# Patient Record
Sex: Male | Born: 1955 | Race: White | Hispanic: No | Marital: Married | Smoking: Former smoker
Health system: Southern US, Community
[De-identification: ages and names within clinical notes are randomized; demographics above are authoritative.]

## PROBLEM LIST (undated history)

## (undated) DIAGNOSIS — G4733 Obstructive sleep apnea (adult) (pediatric): Secondary | ICD-10-CM

## (undated) DIAGNOSIS — K635 Polyp of colon: Secondary | ICD-10-CM

## (undated) DIAGNOSIS — F419 Anxiety disorder, unspecified: Secondary | ICD-10-CM

## (undated) DIAGNOSIS — E559 Vitamin D deficiency, unspecified: Secondary | ICD-10-CM

## (undated) DIAGNOSIS — N189 Chronic kidney disease, unspecified: Secondary | ICD-10-CM

## (undated) DIAGNOSIS — F17200 Nicotine dependence, unspecified, uncomplicated: Secondary | ICD-10-CM

## (undated) DIAGNOSIS — Z9289 Personal history of other medical treatment: Secondary | ICD-10-CM

## (undated) DIAGNOSIS — E785 Hyperlipidemia, unspecified: Secondary | ICD-10-CM

## (undated) DIAGNOSIS — T7840XA Allergy, unspecified, initial encounter: Secondary | ICD-10-CM

## (undated) DIAGNOSIS — I10 Essential (primary) hypertension: Secondary | ICD-10-CM

## (undated) DIAGNOSIS — I219 Acute myocardial infarction, unspecified: Secondary | ICD-10-CM

## (undated) DIAGNOSIS — Z8619 Personal history of other infectious and parasitic diseases: Secondary | ICD-10-CM

## (undated) DIAGNOSIS — Z720 Tobacco use: Secondary | ICD-10-CM

## (undated) DIAGNOSIS — G4730 Sleep apnea, unspecified: Secondary | ICD-10-CM

## (undated) DIAGNOSIS — E669 Obesity, unspecified: Secondary | ICD-10-CM

## (undated) DIAGNOSIS — E7800 Pure hypercholesterolemia, unspecified: Secondary | ICD-10-CM

## (undated) DIAGNOSIS — I2510 Atherosclerotic heart disease of native coronary artery without angina pectoris: Secondary | ICD-10-CM

## (undated) DIAGNOSIS — I209 Angina pectoris, unspecified: Secondary | ICD-10-CM

## (undated) DIAGNOSIS — K219 Gastro-esophageal reflux disease without esophagitis: Secondary | ICD-10-CM

## (undated) HISTORY — DX: Polyp of colon: K63.5

## (undated) HISTORY — PX: CARDIAC CATHETERIZATION: SHX172

## (undated) HISTORY — PX: CORONARY ARTERY BYPASS GRAFT: SHX141

## (undated) HISTORY — PX: TONSILLECTOMY: SUR1361

## (undated) HISTORY — DX: Allergy, unspecified, initial encounter: T78.40XA

## (undated) HISTORY — DX: Vitamin D deficiency, unspecified: E55.9

## (undated) HISTORY — DX: Personal history of other infectious and parasitic diseases: Z86.19

## (undated) HISTORY — DX: Atherosclerotic heart disease of native coronary artery without angina pectoris: I25.10

## (undated) HISTORY — DX: Personal history of other medical treatment: Z92.89

---

## 1995-01-05 HISTORY — PX: BASAL CELL CARCINOMA EXCISION: SHX1214

## 2006-01-04 DIAGNOSIS — K635 Polyp of colon: Secondary | ICD-10-CM

## 2006-01-04 HISTORY — DX: Polyp of colon: K63.5

## 2006-10-07 ENCOUNTER — Ambulatory Visit: Payer: Self-pay | Admitting: Gastroenterology

## 2007-12-25 ENCOUNTER — Ambulatory Visit: Payer: Self-pay | Admitting: Urology

## 2008-01-09 ENCOUNTER — Ambulatory Visit: Payer: Self-pay | Admitting: Urology

## 2008-01-09 ENCOUNTER — Ambulatory Visit: Payer: Self-pay | Admitting: Internal Medicine

## 2008-01-11 ENCOUNTER — Ambulatory Visit: Payer: Self-pay | Admitting: Urology

## 2008-01-11 HISTORY — PX: LITHOTRIPSY: SUR834

## 2010-07-17 ENCOUNTER — Ambulatory Visit: Payer: Self-pay | Admitting: Otolaryngology

## 2011-01-11 ENCOUNTER — Ambulatory Visit: Payer: Self-pay | Admitting: Urology

## 2012-05-31 ENCOUNTER — Ambulatory Visit: Payer: Self-pay | Admitting: Family Medicine

## 2012-06-05 ENCOUNTER — Ambulatory Visit: Payer: Self-pay | Admitting: Cardiology

## 2012-06-05 ENCOUNTER — Inpatient Hospital Stay (HOSPITAL_COMMUNITY)
Admission: AD | Admit: 2012-06-05 | Discharge: 2012-06-10 | DRG: 236 | Disposition: A | Discharge status: Home / Self Care | Payer: Self-pay | Source: Other Acute Inpatient Hospital | Attending: Cardiothoracic Surgery | Admitting: Cardiothoracic Surgery

## 2012-06-05 ENCOUNTER — Inpatient Hospital Stay (HOSPITAL_COMMUNITY): Payer: Self-pay

## 2012-06-05 ENCOUNTER — Encounter (HOSPITAL_COMMUNITY)
Admission: AD | Disposition: A | Discharge status: Home / Self Care | Payer: Self-pay | Source: Other Acute Inpatient Hospital | Attending: Cardiothoracic Surgery

## 2012-06-05 ENCOUNTER — Encounter (HOSPITAL_COMMUNITY): Payer: Self-pay | Admitting: *Deleted

## 2012-06-05 DIAGNOSIS — D62 Acute posthemorrhagic anemia: Secondary | ICD-10-CM | POA: Diagnosis not present

## 2012-06-05 DIAGNOSIS — Z87891 Personal history of nicotine dependence: Secondary | ICD-10-CM | POA: Insufficient documentation

## 2012-06-05 DIAGNOSIS — I2510 Atherosclerotic heart disease of native coronary artery without angina pectoris: Secondary | ICD-10-CM

## 2012-06-05 DIAGNOSIS — E8779 Other fluid overload: Secondary | ICD-10-CM | POA: Diagnosis not present

## 2012-06-05 DIAGNOSIS — E669 Obesity, unspecified: Secondary | ICD-10-CM | POA: Diagnosis present

## 2012-06-05 DIAGNOSIS — E7800 Pure hypercholesterolemia, unspecified: Secondary | ICD-10-CM | POA: Diagnosis present

## 2012-06-05 DIAGNOSIS — Z720 Tobacco use: Secondary | ICD-10-CM

## 2012-06-05 DIAGNOSIS — Z6834 Body mass index (BMI) 34.0-34.9, adult: Secondary | ICD-10-CM

## 2012-06-05 DIAGNOSIS — Z7982 Long term (current) use of aspirin: Secondary | ICD-10-CM

## 2012-06-05 DIAGNOSIS — F17200 Nicotine dependence, unspecified, uncomplicated: Secondary | ICD-10-CM | POA: Diagnosis present

## 2012-06-05 DIAGNOSIS — Z8249 Family history of ischemic heart disease and other diseases of the circulatory system: Secondary | ICD-10-CM

## 2012-06-05 DIAGNOSIS — E785 Hyperlipidemia, unspecified: Secondary | ICD-10-CM | POA: Insufficient documentation

## 2012-06-05 DIAGNOSIS — Z01810 Encounter for preprocedural cardiovascular examination: Secondary | ICD-10-CM

## 2012-06-05 DIAGNOSIS — I252 Old myocardial infarction: Secondary | ICD-10-CM

## 2012-06-05 DIAGNOSIS — G4733 Obstructive sleep apnea (adult) (pediatric): Secondary | ICD-10-CM | POA: Diagnosis present

## 2012-06-05 DIAGNOSIS — Z79899 Other long term (current) drug therapy: Secondary | ICD-10-CM

## 2012-06-05 DIAGNOSIS — I10 Essential (primary) hypertension: Secondary | ICD-10-CM | POA: Diagnosis present

## 2012-06-05 DIAGNOSIS — K219 Gastro-esophageal reflux disease without esophagitis: Secondary | ICD-10-CM | POA: Diagnosis present

## 2012-06-05 HISTORY — DX: Hyperlipidemia, unspecified: E78.5

## 2012-06-05 HISTORY — DX: Essential (primary) hypertension: I10

## 2012-06-05 HISTORY — DX: Angina pectoris, unspecified: I20.9

## 2012-06-05 HISTORY — DX: Obstructive sleep apnea (adult) (pediatric): G47.33

## 2012-06-05 HISTORY — DX: Pure hypercholesterolemia, unspecified: E78.00

## 2012-06-05 HISTORY — DX: Sleep apnea, unspecified: G47.30

## 2012-06-05 HISTORY — DX: Gastro-esophageal reflux disease without esophagitis: K21.9

## 2012-06-05 HISTORY — DX: Obesity, unspecified: E66.9

## 2012-06-05 HISTORY — DX: Tobacco use: Z72.0

## 2012-06-05 HISTORY — DX: Acute myocardial infarction, unspecified: I21.9

## 2012-06-05 HISTORY — DX: Nicotine dependence, unspecified, uncomplicated: F17.200

## 2012-06-05 HISTORY — DX: Atherosclerotic heart disease of native coronary artery without angina pectoris: I25.10

## 2012-06-05 LAB — COMPREHENSIVE METABOLIC PANEL
ALT: 24 U/L (ref 0–53)
BUN: 16 mg/dL (ref 6–23)
CO2: 27 mEq/L (ref 19–32)
Calcium: 9.4 mg/dL (ref 8.4–10.5)
Creatinine, Ser: 0.92 mg/dL (ref 0.50–1.35)
GFR calc Af Amer: >90 mL/min (ref >90)
GFR calc non Af Amer: >90 mL/min (ref >90)
Glucose, Bld: 98 mg/dL (ref 70–99)

## 2012-06-05 LAB — POCT I-STAT 3, ART BLOOD GAS (G3+)
Bicarbonate: 26.2 mEq/L — ABNORMAL HIGH (ref 20.0–24.0)
O2 Saturation: 94 %
pCO2 arterial: 41.5 mmHg (ref 35.0–45.0)
pO2, Arterial: 71 mmHg — ABNORMAL LOW (ref 80.0–100.0)

## 2012-06-05 LAB — CBC
HCT: 50.3 % (ref 39.0–52.0)
Hemoglobin: 17.7 g/dL — ABNORMAL HIGH (ref 13.0–17.0)
MCH: 33.1 pg (ref 26.0–34.0)
MCHC: 35.2 g/dL (ref 30.0–36.0)
MCV: 94.2 fL (ref 78.0–100.0)
RBC: 5.34 MIL/uL (ref 4.22–5.81)

## 2012-06-05 LAB — PROTIME-INR
INR: 1.03 (ref 0.00–1.49)
Prothrombin Time: 13.4 seconds (ref 11.6–15.2)

## 2012-06-05 LAB — URINALYSIS, ROUTINE W REFLEX MICROSCOPIC
Glucose, UA: NEGATIVE mg/dL
Ketones, ur: 15 mg/dL — AB
Protein, ur: NEGATIVE mg/dL
Urobilinogen, UA: 1 mg/dL (ref 0.0–1.0)

## 2012-06-05 LAB — APTT: aPTT: 32 seconds (ref 24–37)

## 2012-06-05 LAB — URINE MICROSCOPIC-ADD ON

## 2012-06-05 LAB — TROPONIN I: Troponin I: <0.3 ng/mL (ref <0.30)

## 2012-06-05 SURGERY — CANCELLED PROCEDURE
Site: Chest

## 2012-06-05 MED ORDER — TEMAZEPAM 15 MG PO CAPS
15.0000 mg | ORAL_CAPSULE | Freq: Once | ORAL | Status: AC | PRN
  Administered 2012-06-05: 15 mg via ORAL
  Filled 2012-06-05: qty 1

## 2012-06-05 MED ORDER — INSULIN REGULAR HUMAN 100 UNIT/ML IJ SOLN
INTRAMUSCULAR | Status: AC
  Administered 2012-06-06: 1.1 [IU]/h via INTRAVENOUS
  Filled 2012-06-05: qty 1

## 2012-06-05 MED ORDER — DOPAMINE-DEXTROSE 3.2-5 MG/ML-% IV SOLN
2.0000 ug/kg/min | INTRAVENOUS | Status: AC
  Administered 2012-06-06: 3 ug/kg/min via INTRAVENOUS
  Filled 2012-06-05: qty 250

## 2012-06-05 MED ORDER — SODIUM CHLORIDE 0.9 % IV SOLN
INTRAVENOUS | Status: DC
  Filled 2012-06-05: qty 30

## 2012-06-05 MED ORDER — DEXTROSE 5 % IV SOLN
1.5000 g | INTRAVENOUS | Status: AC
  Administered 2012-06-06: 1.5 g via INTRAVENOUS
  Administered 2012-06-06: 750 g via INTRAVENOUS
  Filled 2012-06-05: qty 1.5

## 2012-06-05 MED ORDER — SODIUM CHLORIDE 0.9 % IV SOLN
INTRAVENOUS | Status: AC
  Administered 2012-06-06: 70 mL/h via INTRAVENOUS
  Filled 2012-06-05: qty 40

## 2012-06-05 MED ORDER — ONDANSETRON HCL 4 MG/2ML IJ SOLN: 4.0000 mg | Freq: Four times a day (QID) | INTRAMUSCULAR | Status: DC | PRN

## 2012-06-05 MED ORDER — PNEUMOCOCCAL VAC POLYVALENT 25 MCG/0.5ML IJ INJ: 0.5000 mL | INJECTION | INTRAMUSCULAR | Status: DC | PRN

## 2012-06-05 MED ORDER — ACETAMINOPHEN 325 MG PO TABS: 650.0000 mg | ORAL_TABLET | ORAL | Status: DC | PRN

## 2012-06-05 MED ORDER — CHLORHEXIDINE GLUCONATE 4 % EX LIQD
60.0000 mL | Freq: Once | CUTANEOUS | Status: AC
  Administered 2012-06-06: 4 via TOPICAL
  Filled 2012-06-05: qty 60

## 2012-06-05 MED ORDER — VANCOMYCIN HCL 10 G IV SOLR
1500.0000 mg | INTRAVENOUS | Status: AC
  Administered 2012-06-06: 1250 mg via INTRAVENOUS
  Filled 2012-06-05: qty 1500

## 2012-06-05 MED ORDER — EPINEPHRINE HCL 1 MG/ML IJ SOLN
0.5000 ug/min | INTRAVENOUS | Status: DC
  Filled 2012-06-05: qty 4

## 2012-06-05 MED ORDER — CHLORHEXIDINE GLUCONATE 4 % EX LIQD
60.0000 mL | Freq: Once | CUTANEOUS | Status: AC
  Administered 2012-06-05: 4 via TOPICAL
  Filled 2012-06-05: qty 60

## 2012-06-05 MED ORDER — ASPIRIN EC 81 MG PO TBEC
81.0000 mg | DELAYED_RELEASE_TABLET | Freq: Every day | ORAL | Status: DC
  Filled 2012-06-05: qty 1

## 2012-06-05 MED ORDER — NITROGLYCERIN IN D5W 200-5 MCG/ML-% IV SOLN
2.0000 ug/min | INTRAVENOUS | Status: AC
  Administered 2012-06-06: 3 ug/min via INTRAVENOUS
  Filled 2012-06-05: qty 250

## 2012-06-05 MED ORDER — DEXTROSE 5 % IV SOLN
750.0000 mg | INTRAVENOUS | Status: DC
  Filled 2012-06-05: qty 750

## 2012-06-05 MED ORDER — METOPROLOL TARTRATE 12.5 MG HALF TABLET
12.5000 mg | ORAL_TABLET | Freq: Once | ORAL | Status: AC
  Administered 2012-06-06: 12.5 mg via ORAL
  Filled 2012-06-05: qty 1

## 2012-06-05 MED ORDER — VANCOMYCIN HCL 1000 MG IV SOLR
INTRAVENOUS | Status: DC
  Filled 2012-06-05: qty 1000

## 2012-06-05 MED ORDER — POTASSIUM CHLORIDE 2 MEQ/ML IV SOLN
80.0000 meq | INTRAVENOUS | Status: DC
  Filled 2012-06-05: qty 40

## 2012-06-05 MED ORDER — PHENYLEPHRINE HCL 10 MG/ML IJ SOLN
30.0000 ug/min | INTRAVENOUS | Status: AC
  Administered 2012-06-06: 25 ug/min via INTRAVENOUS
  Filled 2012-06-05: qty 2

## 2012-06-05 MED ORDER — PLASMA-LYTE 148 IV SOLN
INTRAVENOUS | Status: AC
  Administered 2012-06-06: 08:00:00
  Filled 2012-06-05: qty 2.5

## 2012-06-05 MED ORDER — METOPROLOL TARTRATE 50 MG PO TABS
50.0000 mg | ORAL_TABLET | Freq: Two times a day (BID) | ORAL | Status: DC
  Administered 2012-06-05: 50 mg via ORAL
  Filled 2012-06-05 (×3): qty 1

## 2012-06-05 MED ORDER — HEPARIN (PORCINE) IN NACL 100-0.45 UNIT/ML-% IJ SOLN
1350.0000 [IU]/h | INTRAMUSCULAR | Status: DC
  Administered 2012-06-05: 1350 [IU]/h via INTRAVENOUS
  Filled 2012-06-05 (×2): qty 250

## 2012-06-05 MED ORDER — MAGNESIUM SULFATE 50 % IJ SOLN
40.0000 meq | INTRAMUSCULAR | Status: DC
  Filled 2012-06-05: qty 10

## 2012-06-05 MED ORDER — BISACODYL 5 MG PO TBEC: 5.0000 mg | DELAYED_RELEASE_TABLET | Freq: Once | ORAL | Status: DC

## 2012-06-05 MED ORDER — DEXMEDETOMIDINE HCL IN NACL 400 MCG/100ML IV SOLN
0.1000 ug/kg/h | INTRAVENOUS | Status: AC
  Administered 2012-06-06: 0.2 ug/kg/h via INTRAVENOUS
  Filled 2012-06-05: qty 100

## 2012-06-05 MED ORDER — NITROGLYCERIN 0.4 MG SL SUBL: 0.4000 mg | SUBLINGUAL_TABLET | SUBLINGUAL | Status: DC | PRN

## 2012-06-05 SURGICAL SUPPLY — 111 items
ADAPTER CARDIO PERF ANTE/RETRO (ADAPTER) IMPLANT
APPLIER CLIP 9.375 MED OPEN (MISCELLANEOUS)
APPLIER CLIP 9.375 SM OPEN (CLIP)
ATTRACTOMAT 16X20 MAGNETIC DRP (DRAPES) IMPLANT
BAG DECANTER FOR FLEXI CONT (MISCELLANEOUS) IMPLANT
BANDAGE ELASTIC 4 VELCRO ST LF (GAUZE/BANDAGES/DRESSINGS) IMPLANT
BANDAGE ELASTIC 6 VELCRO ST LF (GAUZE/BANDAGES/DRESSINGS) IMPLANT
BANDAGE GAUZE ELAST BULKY 4 IN (GAUZE/BANDAGES/DRESSINGS) IMPLANT
BASKET HEART (ORDER IN 25'S) (MISCELLANEOUS)
BASKET HEART (ORDER IN 25S) (MISCELLANEOUS) IMPLANT
BENZOIN TINCTURE PRP APPL 2/3 (GAUZE/BANDAGES/DRESSINGS) IMPLANT
BLADE STERNUM SYSTEM 6 (BLADE) IMPLANT
BLADE SURG ROTATE 9660 (MISCELLANEOUS) IMPLANT
CANISTER SUCTION 2500CC (MISCELLANEOUS) IMPLANT
CANNULA GUNDRY RCSP 15FR (MISCELLANEOUS) IMPLANT
CATH CPB KIT OWEN (MISCELLANEOUS) IMPLANT
CATH THORACIC 28FR (CATHETERS) IMPLANT
CATH THORACIC 28FR RT ANG (CATHETERS) IMPLANT
CATH THORACIC 36FR (CATHETERS) IMPLANT
CATH THORACIC 36FR RT ANG (CATHETERS) IMPLANT
CLIP APPLIE 9.375 MED OPEN (MISCELLANEOUS) IMPLANT
CLIP APPLIE 9.375 SM OPEN (CLIP) IMPLANT
CLIP FOGARTY SPRING 6M (CLIP) IMPLANT
CLIP TI MEDIUM 24 (CLIP) IMPLANT
CLIP TI WIDE RED SMALL 24 (CLIP) IMPLANT
CLOTH BEACON ORANGE TIMEOUT ST (SAFETY) IMPLANT
CONN Y 3/8X3/8X3/8  BEN (MISCELLANEOUS)
CONN Y 3/8X3/8X3/8 BEN (MISCELLANEOUS) IMPLANT
COVER SURGICAL LIGHT HANDLE (MISCELLANEOUS) IMPLANT
CRADLE DONUT ADULT HEAD (MISCELLANEOUS) IMPLANT
DRAIN CHANNEL 32F RND 10.7 FF (WOUND CARE) IMPLANT
DRAPE CARDIOVASCULAR INCISE (DRAPES)
DRAPE SLUSH/WARMER DISC (DRAPES) IMPLANT
DRAPE SRG 135X102X78XABS (DRAPES) IMPLANT
DRSG COVADERM 4X14 (GAUZE/BANDAGES/DRESSINGS) IMPLANT
ELECT REM PT RETURN 9FT ADLT (ELECTROSURGICAL)
ELECTRODE REM PT RTRN 9FT ADLT (ELECTROSURGICAL) IMPLANT
GLOVE BIO SURGEON STRL SZ 6 (GLOVE) IMPLANT
GLOVE BIO SURGEON STRL SZ 6.5 (GLOVE) IMPLANT
GLOVE BIO SURGEON STRL SZ7 (GLOVE) IMPLANT
GLOVE BIO SURGEON STRL SZ7.5 (GLOVE) IMPLANT
GLOVE BIOGEL PI IND STRL 6 (GLOVE) IMPLANT
GLOVE BIOGEL PI IND STRL 6.5 (GLOVE) IMPLANT
GLOVE BIOGEL PI IND STRL 7.0 (GLOVE) IMPLANT
GLOVE BIOGEL PI INDICATOR 6 (GLOVE)
GLOVE BIOGEL PI INDICATOR 6.5 (GLOVE)
GLOVE BIOGEL PI INDICATOR 7.0 (GLOVE)
GLOVE EUDERMIC 7 POWDERFREE (GLOVE) IMPLANT
GLOVE ORTHO TXT STRL SZ7.5 (GLOVE) IMPLANT
GOWN STRL NON-REIN LRG LVL3 (GOWN DISPOSABLE) IMPLANT
HEMOSTAT POWDER SURGIFOAM 1G (HEMOSTASIS) IMPLANT
INSERT FOGARTY 61MM (MISCELLANEOUS) IMPLANT
INSERT FOGARTY XLG (MISCELLANEOUS) IMPLANT
KIT BASIN OR (CUSTOM PROCEDURE TRAY) IMPLANT
KIT ROOM TURNOVER OR (KITS) IMPLANT
KIT SUCTION CATH 14FR (SUCTIONS) IMPLANT
KIT VASOVIEW W/TROCAR VH 2000 (KITS) IMPLANT
LEAD PACING MYOCARDI (MISCELLANEOUS) IMPLANT
MARKER GRAFT CORONARY BYPASS (MISCELLANEOUS) IMPLANT
NS IRRIG 1000ML POUR BTL (IV SOLUTION) IMPLANT
PACK OPEN HEART (CUSTOM PROCEDURE TRAY) IMPLANT
PAD ARMBOARD 7.5X6 YLW CONV (MISCELLANEOUS) IMPLANT
PAD ELECT DEFIB RADIOL ZOLL (MISCELLANEOUS) IMPLANT
PENCIL BUTTON HOLSTER BLD 10FT (ELECTRODE) IMPLANT
PUNCH AORTIC ROTATE 4.0MM (MISCELLANEOUS) IMPLANT
PUNCH AORTIC ROTATE 4.5MM 8IN (MISCELLANEOUS) IMPLANT
PUNCH AORTIC ROTATE 5MM 8IN (MISCELLANEOUS) IMPLANT
SOLUTION ANTI FOG 6CC (MISCELLANEOUS) IMPLANT
SPONGE GAUZE 4X4 12PLY (GAUZE/BANDAGES/DRESSINGS) IMPLANT
SPONGE LAP 18X18 X RAY DECT (DISPOSABLE) IMPLANT
SPONGE LAP 4X18 X RAY DECT (DISPOSABLE) IMPLANT
SUT BONE WAX W31G (SUTURE) IMPLANT
SUT ETHIBOND X763 2 0 SH 1 (SUTURE) IMPLANT
SUT MNCRL AB 3-0 PS2 18 (SUTURE) IMPLANT
SUT MNCRL AB 4-0 PS2 18 (SUTURE) IMPLANT
SUT PDS AB 1 CTX 36 (SUTURE) IMPLANT
SUT PROLENE 2 0 SH DA (SUTURE) IMPLANT
SUT PROLENE 3 0 SH DA (SUTURE) IMPLANT
SUT PROLENE 3 0 SH1 36 (SUTURE) IMPLANT
SUT PROLENE 4 0 RB 1 (SUTURE)
SUT PROLENE 4 0 SH DA (SUTURE) IMPLANT
SUT PROLENE 4-0 RB1 .5 CRCL 36 (SUTURE) IMPLANT
SUT PROLENE 5 0 C 1 36 (SUTURE) IMPLANT
SUT PROLENE 6 0 C 1 30 (SUTURE) IMPLANT
SUT PROLENE 7.0 RB 3 (SUTURE) IMPLANT
SUT PROLENE 8 0 BV175 6 (SUTURE) IMPLANT
SUT PROLENE BLUE 7 0 (SUTURE) IMPLANT
SUT PROLENE POLY MONO (SUTURE) IMPLANT
SUT SILK  1 MH (SUTURE)
SUT SILK 1 MH (SUTURE) IMPLANT
SUT STEEL 6MS V (SUTURE) IMPLANT
SUT STEEL STERNAL CCS#1 18IN (SUTURE) IMPLANT
SUT STEEL SZ 6 DBL 3X14 BALL (SUTURE) IMPLANT
SUT VIC AB 1 CTX 36 (SUTURE)
SUT VIC AB 1 CTX36XBRD ANBCTR (SUTURE) IMPLANT
SUT VIC AB 2-0 CT1 27 (SUTURE)
SUT VIC AB 2-0 CT1 TAPERPNT 27 (SUTURE) IMPLANT
SUT VIC AB 2-0 CTX 27 (SUTURE) IMPLANT
SUT VIC AB 3-0 SH 27 (SUTURE)
SUT VIC AB 3-0 SH 27X BRD (SUTURE) IMPLANT
SUT VIC AB 3-0 X1 27 (SUTURE) IMPLANT
SUT VICRYL 4-0 PS2 18IN ABS (SUTURE) IMPLANT
SUTURE E-PAK OPEN HEART (SUTURE) IMPLANT
SYSTEM SAHARA CHEST DRAIN ATS (WOUND CARE) IMPLANT
TOWEL OR 17X24 6PK STRL BLUE (TOWEL DISPOSABLE) IMPLANT
TOWEL OR 17X26 10 PK STRL BLUE (TOWEL DISPOSABLE) IMPLANT
TRAY FOLEY IC TEMP SENS 14FR (CATHETERS) IMPLANT
TUBE SUCT INTRACARD DLP 20F (MISCELLANEOUS) IMPLANT
TUBING INSUFFLATION 10FT LAP (TUBING) IMPLANT
UNDERPAD 30X30 INCONTINENT (UNDERPADS AND DIAPERS) IMPLANT
WATER STERILE IRR 1000ML POUR (IV SOLUTION) IMPLANT

## 2012-06-05 NOTE — Progress Notes (Signed)
ANTICOAGULATION CONSULT NOTE - Initial Consult  Pharmacy Consult for heparin Indication: chest pain/ACS  Allergies  Allergen Reactions  . Sulfa Antibiotics     "Makes him feel worse then better when taken"per wife    Patient Measurements: Height: 5\' 6"  (167.6 cm) Weight: 213 lb (96.616 kg) IBW/kg (Calculated) : 63.8 Heparin Dosing Weight:   Vital Signs: Temp: 98.4 F (36.9 C) (06/02 1641) Temp src: Oral (06/02 1641) BP: 150/90 mmHg (06/02 1700) Pulse Rate: 83 (06/02 1700)  Labs: No results found for this basename: HGB, HCT, PLT, APTT, LABPROT, INR, HEPARINUNFRC, CREATININE, CKTOTAL, CKMB, TROPONINI,  in the last 72 hours  CrCl is unknown because no creatinine reading has been taken.   Medical History: Past Medical History  Diagnosis Date  . Coronary artery disease   . Hypertension   . Sleep apnea   . Anginal pain   . Myocardial infarction   . GERD (gastroesophageal reflux disease)   . Smoker   . Hypercholesterolemia   . Left main coronary artery disease 06/05/2012  . Essential hypertension, benign 06/05/2012  . Tobacco abuse 06/05/2012  . Hyperlipidemia 06/05/2012  . Obesity (BMI 30-39.9) 06/05/2012  . Obstructive sleep apnea 06/05/2012    Medications:  Scheduled:  . aminocaproic acid (AMICAR) for OHS   Intravenous To OR  . [START ON 06/06/2012] aspirin EC  81 mg Oral Daily  . bisacodyl  5 mg Oral Once  . cefUROXime (ZINACEF)  IV  1.5 g Intravenous To OR  . cefUROXime (ZINACEF)  IV  750 mg Intravenous To OR  . chlorhexidine  60 mL Topical Once  . [START ON 06/06/2012] chlorhexidine  60 mL Topical Once  . dexmedetomidine  0.1-0.7 mcg/kg/hr Intravenous To OR  . DOPamine  2-20 mcg/kg/min Intravenous To OR  . epinephrine  0.5-20 mcg/min Intravenous To OR  . heparin-papaverine-plasmalyte irrigation   Irrigation To OR  . heparin 30,000 units/NS 1000 mL solution for CELLSAVER   Other To OR  . insulin (NOVOLIN-R) infusion   Intravenous To OR  . magnesium sulfate  40 mEq Other  To OR  . metoprolol tartrate  50 mg Oral BID  . [START ON 06/06/2012] metoprolol tartrate  12.5 mg Oral Once  . nitroGLYCERIN  2-200 mcg/min Intravenous To OR  . phenylephrine (NEO-SYNEPHRINE) Adult infusion  30-200 mcg/min Intravenous To OR  . potassium chloride  80 mEq Other To OR  . vancomycin 1000 mg in NS (1000 ml) irrigation for Dr. Cornelius Moras case   Irrigation To OR  . vancomycin  1,500 mg Intravenous To OR    Assessment: Pt had a cardiac cath today. He was found to have 99% stenosis of the left main coronary artery with 100% chronic occlusion of the right coronary artery. The patient was set up for an emergent CABG this afternoon but the pt had eaten lunch at 3:00PM so it was decided to postpone for tomorrow morning. Pt to be started on heparin. Goal of Therapy:  Heparin level 0.3-0.7 units/ml Monitor platelets by anticoagulation protocol: Yes   Plan:  No heparin bolus due to cardiac cath today.  Start heparin at 1350 units/hr.   Eugene Garnet 06/05/2012,6:53 PM

## 2012-06-05 NOTE — H&P (Addendum)
[address]       [name] [address]             [phone]      CARDIOTHORACIC SURGERY HISTORY AND PHYSICAL EXAM  PCP is Bosie Clos, MD Referring Provider is Harold Hedge, MD   Chief Complaint:  CHEST PAIN  HPI:  Patient is a 57 year old obese white male with no previous history of coronary artery disease but risk factors notable for history of hypertension, hypercholesterolemia, long-standing tobacco abuse, and a family history of coronary artery disease. The patient reportedly had a functional stress test performed in 2012 that was felt to be low risk for ischemia. The patient states that he was in his usual state of health until approximately 6 weeks ago when he first began to experience one of a total of 4 episodes of postprandial chest discomfort described as tightness radiating across his chest. He states that these episodes always occurred within 30 minutes of a meal in the evening and were associated with nausea and occasionally emesis as well as diaphoresis. The last and most recent episode of chest discomfort occurred 10 days ago and was more prolonged, lasting nearly 30 minutes before the symptoms resolved. The patient was evaluated in the office last Tuesday by Dr. Vonita Moss. Apparently a gallbladder ultrasound was performed and felt to be unremarkable but a EKG was performed demonstrating sinus rhythm with inferolateral ST segment depression. Serum troponin levels were elevated at 3.8. The patient was referred to Dr. Lady Gary who saw him in the office 3 days ago and scheduled him for elective cardiac catheterization which was performed this morning at [facility]. Cardiac catheterization is notable for the presence of 99% stenosis of the left main coronary artery with 100% chronic occlusion of the right coronary artery and left to right collaterals filling of the distal right corner circulation. Left ventricular function was preserved. The patient was  transferred directly to [facility]. Just prior to transport the patient was given lunch this afternoon at 3:00 PM.    Upon arrival at [facility] the patient notes that he feels well. He has not had any episodes of chest discomfort since a week ago Friday. He denies any other symptoms of chest discomfort either with activity or at rest other than the total for episodes which always occurred following meals. He otherwise denies any exertional shortness of breath, resting shortness of breath, PND, orthopnea, tachypalpitations, dizzy spells, or syncope.  Past Medical History  Diagnosis Date  . Coronary artery disease   . Hypertension   . Sleep apnea   . Anginal pain   . Myocardial infarction   . GERD (gastroesophageal reflux disease)   . Smoker   . Hypercholesterolemia   . Left main coronary artery disease 06/05/2012  . Essential hypertension, benign 06/05/2012  . Tobacco abuse 06/05/2012  . Hyperlipidemia 06/05/2012  . Obesity (BMI 30-39.9) 06/05/2012  . Obstructive sleep apnea 06/05/2012    Past Surgical History  Procedure Laterality Date  . Cardiac catheterization    . Tonsillectomy      Family History:  Notable for a strong presence of CAD  Social History History  Substance Use Topics  . Smoking status: Current Every Day Smoker -- 1.50 packs/day for 37 years    Types: Cigarettes  . Smokeless tobacco: Never Used  . Alcohol Use: No    Prior to Admission medications   Medication Sig Start Date End Date Taking? Authorizing Provider  aspirin EC  81 MG tablet Take 81 mg by mouth every evening.    Yes Historical Provider, MD  cetirizine (ZYRTEC) 10 MG tablet Take 10 mg by mouth every evening.   Yes Historical Provider, MD  cholecalciferol (VITAMIN D) 1000 UNITS tablet Take 2,000 Units by mouth every evening.   Yes Historical Provider, MD  losartan-hydrochlorothiazide (HYZAAR) 100-12.5 MG per tablet Take 1 tablet by mouth every evening.    Yes Historical Provider, MD  metoprolol  tartrate (LOPRESSOR) 25 MG tablet Take 50 mg by mouth every evening.    Yes Historical Provider, MD  rosuvastatin (CRESTOR) 20 MG tablet Take 20 mg by mouth every evening.    Yes Historical Provider, MD    Allergies  Allergen Reactions  . Sulfa Antibiotics     "Makes him feel worse then better when taken"per wife    Review of Systems:  General:  normal appetite, normal energy   Respiratory:  no cough, no wheezing, no hemoptysis, no pain with inspiration or cough, no shortness of breath   Cardiac:   no chest pain or tightness, no exertional SOB, no resting SOB, no PND, no orthopnea, no LE edema, no palpitations, no syncope  GI:   no difficulty swallowing, no hematochezia, no hematemesis, no melena, no constipation, no diarrhea   GU:   no dysuria, no urgency, no frequency   Musculoskeletal: no arthritis, no arthralgia   Vascular:  no pain suggestive of claudication   Neuro:   no symptoms suggestive of TIA's, no seizures, no headaches, no peripheral neuropathy   Endocrine:  Negative   HEENT:  no loose teeth or painful teeth,  no recent vision changes  Psych:   no anxiety, no depression    Physical Exam:   BP 150/90  Pulse 83  Temp(Src) 98.4 F (36.9 C) (Oral)  Resp 14  Ht 5\' 6"  (1.676 m)  Wt 96.616 kg (213 lb)  BMI 34.4 kg/m2  SpO2 98%  General:  Mildly obese but  well-appearing  HEENT:  Unremarkable   Neck:   no JVD, no bruits, no adenopathy   Chest:   clear to auscultation, symmetrical breath sounds, no wheezes, no rhonchi   CV:   RRR, no  murmur   Abdomen:  soft, non-tender, no masses   Extremities:  warm, well-perfused, pulses diminished both lower legs  Rectal/GU  Deferred  Neuro:   Grossly non-focal and symmetrical throughout  Skin:   Clean and dry, no rashes, no breakdown  Diagnostic Tests:  Cardiac catheterization performed today at [facility] is reviewed. There is critical 99% stenosis of the distal left main coronary artery with ulcerated  plaque extending into the bifurcation. There is subtotal occlusion of the terminal branches of the left circumflex coronary artery. There is a large intermediate branch vessel without significant disease. Left anterior descending coronary artery is free of significant disease. There is 100% chronic occlusion of the right coronary artery with left to right collateral filling of the terminal branches including the posterior descending coronary artery. Left ventricular function appears preserved with no significant wall motion abnormalities.   Impression:  Critical left main disease with three-vessel coronary artery disease and preserved left ventricular function. The patient has had a total of 4 episodes of postprandial chest pain consistent with angina pectoris, the most recent of which occurred 10 days ago and was associated with elevated troponin levels obtained 6 days ago at Dr. Christell Faith office.  Given the patient's cardiac cath findings I feel there is no  question that the patient needs to proceed with urgent coronary artery bypass grafting. Unfortunately, he was given a meal at 3:00 this afternoon just prior to transport. Options include proceeding with surgery this evening with perhaps slightly increased risk of aspiration during induction of anesthesia versus waiting until first thing tomorrow morning.    Plan:  I've discussed the indications, risks, and potential benefits of coronary artery bypass grafting at length with the patient and his wife this afternoon. We plan to hold off until tomorrow morning given the fact that he just had a small meal and that he remains very stable without any symptoms of chest pain for the past 10 days.  The patient and his family understand and accept all potential associated risks of surgery including but not limited to risk of death, stroke, myocardial infarction, congestive heart failure, respiratory failure, renal failure, bleeding requiring blood transfusion,  arrhythmia, infection, and late recurrence of coronary artery disease. The patient has been counseled regarding his need to quit smoking permanently.  They understand the very small risk that he might develop acute coronary ischemia in the between now and the time of surgery which could potentially result in heart attack or need to proceed to the operating room emergently tonight. All the questions been addressed.    Salvatore Decent. Cornelius Moras, MD

## 2012-06-05 NOTE — Progress Notes (Signed)
VASCULAR LAB PRELIMINARY  PRELIMINARY  PRELIMINARY  PRELIMINARY  Pre-op Cardiac Surgery  Carotid Findings: Bilateral:  No evidence of hemodynamically significant internal carotid artery stenosis.   Vertebral artery flow is antegrade.     Upper Extremity Right Left  Brachial Pressures 162 Triphasic 164 Triphasic  Radial Waveforms Triphasic Triphasic  Ulnar Waveforms Triphasic Triphasic  Palmar Arch (Allen's Test) Normal Normal    Findings:   Doppler waveforms remained normal bilaterally with both radial and ulnar compressions   Lower  Extremity Right Left  Dorsalis Pedis    Anterior Tibial    Posterior Tibial    Ankle/Brachial Indices      Findings:  Pedal pulses were palpable bilaterally   Zoi Devine, RVS 06/05/2012, 8:44 PM

## 2012-06-05 NOTE — OR Nursing (Signed)
Supplies opened for CABG and case cancelled per MD.

## 2012-06-06 ENCOUNTER — Encounter (HOSPITAL_COMMUNITY): Payer: Self-pay | Admitting: Anesthesiology

## 2012-06-06 ENCOUNTER — Inpatient Hospital Stay (HOSPITAL_COMMUNITY): Payer: Self-pay | Admitting: Anesthesiology

## 2012-06-06 ENCOUNTER — Inpatient Hospital Stay (HOSPITAL_COMMUNITY): Payer: Self-pay

## 2012-06-06 ENCOUNTER — Encounter (HOSPITAL_COMMUNITY)
Admission: AD | Disposition: A | Discharge status: Home / Self Care | Payer: Self-pay | Source: Other Acute Inpatient Hospital | Attending: Cardiothoracic Surgery

## 2012-06-06 DIAGNOSIS — I2510 Atherosclerotic heart disease of native coronary artery without angina pectoris: Secondary | ICD-10-CM

## 2012-06-06 HISTORY — PX: CORONARY ARTERY BYPASS GRAFT: SHX141

## 2012-06-06 HISTORY — PX: INTRAOPERATIVE TRANSESOPHAGEAL ECHOCARDIOGRAM: SHX5062

## 2012-06-06 HISTORY — PX: ENDOVEIN HARVEST OF GREATER SAPHENOUS VEIN: SHX5059

## 2012-06-06 LAB — CBC
HCT: 51.2 % (ref 39.0–52.0)
HCT: 40.5 % (ref 39.0–52.0)
HCT: 41 % (ref 39.0–52.0)
Hemoglobin: 14.7 g/dL (ref 13.0–17.0)
Hemoglobin: 14.7 g/dL (ref 13.0–17.0)
MCH: 33.3 pg (ref 26.0–34.0)
MCHC: 36.3 g/dL — ABNORMAL HIGH (ref 30.0–36.0)
MCHC: 35.9 g/dL (ref 30.0–36.0)
MCV: 93.4 fL (ref 78.0–100.0)
MCV: 93 fL (ref 78.0–100.0)
Platelets: 158 10*3/uL (ref 150–400)
RBC: 5.48 MIL/uL (ref 4.22–5.81)
RBC: 4.35 MIL/uL (ref 4.22–5.81)
RBC: 4.41 MIL/uL (ref 4.22–5.81)
RDW: 12.2 % (ref 11.5–15.5)
RDW: 12.1 % (ref 11.5–15.5)
WBC: 19 10*3/uL — ABNORMAL HIGH (ref 4.0–10.5)

## 2012-06-06 LAB — GLUCOSE, CAPILLARY
Glucose-Capillary: 130 mg/dL — ABNORMAL HIGH (ref 70–99)
Glucose-Capillary: 139 mg/dL — ABNORMAL HIGH (ref 70–99)
Glucose-Capillary: 141 mg/dL — ABNORMAL HIGH (ref 70–99)
Glucose-Capillary: 148 mg/dL — ABNORMAL HIGH (ref 70–99)
Glucose-Capillary: 216 mg/dL — ABNORMAL HIGH (ref 70–99)
Glucose-Capillary: 144 mg/dL — ABNORMAL HIGH (ref 70–99)

## 2012-06-06 LAB — BASIC METABOLIC PANEL
BUN: 15 mg/dL (ref 6–23)
CO2: 26 mEq/L (ref 19–32)
Chloride: 101 mEq/L (ref 96–112)
GFR calc Af Amer: >90 mL/min (ref >90)
Potassium: 3.6 mEq/L (ref 3.5–5.1)

## 2012-06-06 LAB — LIPID PANEL
HDL: 38 mg/dL — ABNORMAL LOW (ref >39)
LDL Cholesterol: 68 mg/dL (ref 0–99)
Total CHOL/HDL Ratio: 3.4 RATIO
Triglycerides: 111 mg/dL (ref <150)

## 2012-06-06 LAB — POCT I-STAT 4, (NA,K, GLUC, HGB,HCT)
Glucose, Bld: 97 mg/dL (ref 70–99)
Glucose, Bld: 87 mg/dL (ref 70–99)
Glucose, Bld: 138 mg/dL — ABNORMAL HIGH (ref 70–99)
HCT: 51 % (ref 39.0–52.0)
HCT: 34 % — ABNORMAL LOW (ref 39.0–52.0)
HCT: 42 % (ref 39.0–52.0)
Hemoglobin: 17.3 g/dL — ABNORMAL HIGH (ref 13.0–17.0)
Hemoglobin: 15.6 g/dL (ref 13.0–17.0)
Hemoglobin: 11.6 g/dL — ABNORMAL LOW (ref 13.0–17.0)
Hemoglobin: 12.9 g/dL — ABNORMAL LOW (ref 13.0–17.0)
Potassium: 3.6 mEq/L (ref 3.5–5.1)
Potassium: 4.3 mEq/L (ref 3.5–5.1)
Potassium: 3.6 mEq/L (ref 3.5–5.1)
Sodium: 140 mEq/L (ref 135–145)
Sodium: 141 mEq/L (ref 135–145)

## 2012-06-06 LAB — CREATININE, SERUM
Creatinine, Ser: 0.77 mg/dL (ref 0.50–1.35)
GFR calc Af Amer: >90 mL/min (ref >90)
GFR calc non Af Amer: >90 mL/min (ref >90)

## 2012-06-06 LAB — TROPONIN I: Troponin I: <0.3 ng/mL (ref <0.30)

## 2012-06-06 LAB — HEMOGLOBIN AND HEMATOCRIT, BLOOD
HCT: 35.8 % — ABNORMAL LOW (ref 39.0–52.0)
Hemoglobin: 12.9 g/dL — ABNORMAL LOW (ref 13.0–17.0)

## 2012-06-06 LAB — MAGNESIUM: Magnesium: 2.4 mg/dL (ref 1.5–2.5)

## 2012-06-06 LAB — POCT I-STAT 3, ART BLOOD GAS (G3+)
Acid-base deficit: 3 mmol/L — ABNORMAL HIGH (ref 0.0–2.0)
Acid-base deficit: 5 mmol/L — ABNORMAL HIGH (ref 0.0–2.0)
Bicarbonate: 21 mEq/L (ref 20.0–24.0)
O2 Saturation: 100 %
O2 Saturation: 95 %
Patient temperature: 36.8
Patient temperature: 36.7
TCO2: 23 mmol/L (ref 0–100)
pCO2 arterial: 38.1 mmHg (ref 35.0–45.0)
pH, Arterial: 7.415 (ref 7.350–7.450)
pH, Arterial: 7.302 — ABNORMAL LOW (ref 7.350–7.450)
pO2, Arterial: 324 mmHg — ABNORMAL HIGH (ref 80.0–100.0)
pO2, Arterial: 84 mmHg (ref 80.0–100.0)

## 2012-06-06 LAB — PLATELET COUNT: Platelets: 165 10*3/uL (ref 150–400)

## 2012-06-06 LAB — PROTIME-INR: INR: 1.48 (ref 0.00–1.49)

## 2012-06-06 LAB — HEMOGLOBIN A1C: Hgb A1c MFr Bld: 5.5 % (ref <5.7)

## 2012-06-06 LAB — APTT: aPTT: 30 seconds (ref 24–37)

## 2012-06-06 SURGERY — CORONARY ARTERY BYPASS GRAFTING (CABG)
Anesthesia: General | Site: Leg Upper | Laterality: Right | Wound class: Clean

## 2012-06-06 MED ORDER — HEPARIN SODIUM (PORCINE) 1000 UNIT/ML IJ SOLN
INTRAMUSCULAR | Status: DC | PRN
  Administered 2012-06-06: 25000 [IU] via INTRAVENOUS

## 2012-06-06 MED ORDER — ASPIRIN 81 MG PO CHEW: 324.0000 mg | CHEWABLE_TABLET | Freq: Every day | ORAL | Status: DC

## 2012-06-06 MED ORDER — VITAMIN D3 25 MCG (1000 UNIT) PO TABS
2000.0000 [IU] | ORAL_TABLET | Freq: Every evening | ORAL | Status: DC
  Administered 2012-06-07: 2000 [IU] via ORAL
  Filled 2012-06-06 (×3): qty 2

## 2012-06-06 MED ORDER — ACETAMINOPHEN 10 MG/ML IV SOLN
1000.0000 mg | Freq: Once | INTRAVENOUS | Status: AC
  Administered 2012-06-06: 1000 mg via INTRAVENOUS
  Filled 2012-06-06: qty 100

## 2012-06-06 MED ORDER — SODIUM CHLORIDE 0.9 % IJ SOLN
OROMUCOSAL | Status: DC | PRN
  Administered 2012-06-06: 08:00:00 via TOPICAL

## 2012-06-06 MED ORDER — MIDAZOLAM HCL 5 MG/5ML IJ SOLN
INTRAMUSCULAR | Status: DC | PRN
  Administered 2012-06-06 (×2): 2 mg via INTRAVENOUS
  Administered 2012-06-06: 4 mg via INTRAVENOUS
  Administered 2012-06-06: 2 mg via INTRAVENOUS
  Administered 2012-06-06: 5 mg via INTRAVENOUS
  Administered 2012-06-06: 4 mg via INTRAVENOUS
  Administered 2012-06-06: 1 mg via INTRAVENOUS

## 2012-06-06 MED ORDER — MORPHINE SULFATE 2 MG/ML IJ SOLN
2.0000 mg | INTRAMUSCULAR | Status: DC | PRN
  Administered 2012-06-06 (×2): 4 mg via INTRAVENOUS
  Administered 2012-06-07: 2 mg via INTRAVENOUS
  Administered 2012-06-07: 4 mg via INTRAVENOUS
  Administered 2012-06-07: 2 mg via INTRAVENOUS
  Administered 2012-06-07 (×2): 4 mg via INTRAVENOUS
  Administered 2012-06-07: 2 mg via INTRAVENOUS
  Administered 2012-06-07: 4 mg via INTRAVENOUS
  Filled 2012-06-06 (×2): qty 1
  Filled 2012-06-06 (×4): qty 2
  Filled 2012-06-06: qty 1

## 2012-06-06 MED ORDER — ALBUMIN HUMAN 5 % IV SOLN
INTRAVENOUS | Status: DC | PRN
  Administered 2012-06-06 (×2): via INTRAVENOUS

## 2012-06-06 MED ORDER — LACTATED RINGERS IV SOLN
INTRAVENOUS | Status: DC | PRN
  Administered 2012-06-06 (×2): via INTRAVENOUS

## 2012-06-06 MED ORDER — ACETAMINOPHEN 160 MG/5ML PO SOLN
975.0000 mg | Freq: Four times a day (QID) | ORAL | Status: DC
  Filled 2012-06-06: qty 40.6

## 2012-06-06 MED ORDER — METOPROLOL TARTRATE 1 MG/ML IV SOLN: 2.5000 mg | INTRAVENOUS | Status: DC | PRN

## 2012-06-06 MED ORDER — LACTATED RINGERS IV SOLN
INTRAVENOUS | Status: DC | PRN
  Administered 2012-06-06: 07:00:00 via INTRAVENOUS

## 2012-06-06 MED ORDER — 0.9 % SODIUM CHLORIDE (POUR BTL) OPTIME
TOPICAL | Status: DC | PRN
  Administered 2012-06-06: 1000 mL

## 2012-06-06 MED ORDER — PROPOFOL 10 MG/ML IV BOLUS
INTRAVENOUS | Status: DC | PRN
  Administered 2012-06-06: 150 mg via INTRAVENOUS

## 2012-06-06 MED ORDER — ASPIRIN EC 325 MG PO TBEC
325.0000 mg | DELAYED_RELEASE_TABLET | Freq: Every day | ORAL | Status: DC
  Filled 2012-06-06: qty 1

## 2012-06-06 MED ORDER — ATORVASTATIN CALCIUM 40 MG PO TABS
40.0000 mg | ORAL_TABLET | Freq: Every day | ORAL | Status: DC
  Filled 2012-06-06 (×2): qty 1

## 2012-06-06 MED ORDER — NITROGLYCERIN IN D5W 200-5 MCG/ML-% IV SOLN: 0.0000 ug/min | INTRAVENOUS | Status: DC

## 2012-06-06 MED ORDER — VECURONIUM BROMIDE 10 MG IV SOLR
INTRAVENOUS | Status: DC | PRN
  Administered 2012-06-06 (×2): 5 mg via INTRAVENOUS

## 2012-06-06 MED ORDER — SODIUM BICARBONATE 8.4 % IV SOLN
50.0000 meq | Freq: Once | INTRAVENOUS | Status: AC
  Administered 2012-06-06: 50 meq via INTRAVENOUS

## 2012-06-06 MED ORDER — FENTANYL CITRATE 0.05 MG/ML IJ SOLN
INTRAMUSCULAR | Status: DC | PRN
  Administered 2012-06-06 (×2): 250 ug via INTRAVENOUS
  Administered 2012-06-06: 50 ug via INTRAVENOUS
  Administered 2012-06-06: 250 ug via INTRAVENOUS
  Administered 2012-06-06: 150 ug via INTRAVENOUS
  Administered 2012-06-06: 200 ug via INTRAVENOUS
  Administered 2012-06-06: 150 ug via INTRAVENOUS
  Administered 2012-06-06: 200 ug via INTRAVENOUS

## 2012-06-06 MED ORDER — SODIUM CHLORIDE 0.9 % IJ SOLN: 3.0000 mL | INTRAMUSCULAR | Status: DC | PRN

## 2012-06-06 MED ORDER — SODIUM CHLORIDE 0.9 % IV SOLN: INTRAVENOUS | Status: DC

## 2012-06-06 MED ORDER — SODIUM CHLORIDE 0.9 % IJ SOLN: 3.0000 mL | Freq: Two times a day (BID) | INTRAMUSCULAR | Status: DC

## 2012-06-06 MED ORDER — ONDANSETRON HCL 4 MG/2ML IJ SOLN: 4.0000 mg | Freq: Four times a day (QID) | INTRAMUSCULAR | Status: DC | PRN

## 2012-06-06 MED ORDER — ROCURONIUM BROMIDE 100 MG/10ML IV SOLN
INTRAVENOUS | Status: DC | PRN
  Administered 2012-06-06 (×3): 50 mg via INTRAVENOUS

## 2012-06-06 MED ORDER — INSULIN REGULAR BOLUS VIA INFUSION
0.0000 [IU] | Freq: Three times a day (TID) | INTRAVENOUS | Status: DC
  Filled 2012-06-06: qty 10

## 2012-06-06 MED ORDER — SODIUM CHLORIDE 0.9 % IV SOLN
INTRAVENOUS | Status: DC
  Filled 2012-06-06: qty 1

## 2012-06-06 MED ORDER — DEXMEDETOMIDINE HCL IN NACL 200 MCG/50ML IV SOLN
0.1000 ug/kg/h | INTRAVENOUS | Status: DC
  Administered 2012-06-06: 0.5 ug/kg/h via INTRAVENOUS
  Filled 2012-06-06: qty 50

## 2012-06-06 MED ORDER — METOCLOPRAMIDE HCL 5 MG/ML IJ SOLN
10.0000 mg | Freq: Four times a day (QID) | INTRAMUSCULAR | Status: AC
  Administered 2012-06-06 – 2012-06-07 (×3): 10 mg via INTRAVENOUS
  Filled 2012-06-06 (×4): qty 2

## 2012-06-06 MED ORDER — DOCUSATE SODIUM 100 MG PO CAPS: 200.0000 mg | ORAL_CAPSULE | Freq: Every day | ORAL | Status: DC

## 2012-06-06 MED ORDER — MORPHINE SULFATE 2 MG/ML IJ SOLN
1.0000 mg | INTRAMUSCULAR | Status: AC | PRN
  Administered 2012-06-06: 2 mg via INTRAVENOUS
  Administered 2012-06-06: 4 mg via INTRAVENOUS
  Administered 2012-06-06: 2 mg via INTRAVENOUS
  Administered 2012-06-06 – 2012-06-07 (×2): 4 mg via INTRAVENOUS
  Filled 2012-06-06: qty 2

## 2012-06-06 MED ORDER — SODIUM CHLORIDE 0.45 % IV SOLN
INTRAVENOUS | Status: DC
  Administered 2012-06-06: 14:00:00 via INTRAVENOUS

## 2012-06-06 MED ORDER — VANCOMYCIN HCL IN DEXTROSE 1-5 GM/200ML-% IV SOLN
1000.0000 mg | Freq: Once | INTRAVENOUS | Status: AC
  Administered 2012-06-06: 1000 mg via INTRAVENOUS
  Filled 2012-06-06: qty 200

## 2012-06-06 MED ORDER — PROTAMINE SULFATE 10 MG/ML IV SOLN
INTRAVENOUS | Status: DC | PRN
  Administered 2012-06-06: 170 mg via INTRAVENOUS
  Administered 2012-06-06 (×2): 50 mg via INTRAVENOUS

## 2012-06-06 MED ORDER — METOPROLOL TARTRATE 25 MG/10 ML ORAL SUSPENSION
12.5000 mg | Freq: Two times a day (BID) | ORAL | Status: DC
  Filled 2012-06-06 (×3): qty 5

## 2012-06-06 MED ORDER — ARTIFICIAL TEARS OP OINT
TOPICAL_OINTMENT | OPHTHALMIC | Status: DC | PRN
  Administered 2012-06-06: 1 via OPHTHALMIC

## 2012-06-06 MED ORDER — FAMOTIDINE IN NACL 20-0.9 MG/50ML-% IV SOLN
20.0000 mg | Freq: Two times a day (BID) | INTRAVENOUS | Status: DC
  Administered 2012-06-06: 20 mg via INTRAVENOUS

## 2012-06-06 MED ORDER — OXYCODONE HCL 5 MG PO TABS
5.0000 mg | ORAL_TABLET | ORAL | Status: DC | PRN
  Filled 2012-06-06: qty 2

## 2012-06-06 MED ORDER — LACTATED RINGERS IV SOLN: 500.0000 mL | Freq: Once | INTRAVENOUS | Status: AC | PRN

## 2012-06-06 MED ORDER — HEMOSTATIC AGENTS (NO CHARGE) OPTIME
TOPICAL | Status: DC | PRN
  Administered 2012-06-06: 1 via TOPICAL

## 2012-06-06 MED ORDER — BISACODYL 10 MG RE SUPP: 10.0000 mg | Freq: Every day | RECTAL | Status: DC

## 2012-06-06 MED ORDER — MIDAZOLAM HCL 2 MG/2ML IJ SOLN: 2.0000 mg | INTRAMUSCULAR | Status: DC | PRN

## 2012-06-06 MED ORDER — DEXTROSE 5 % IV SOLN
1.5000 g | Freq: Two times a day (BID) | INTRAVENOUS | Status: DC
  Administered 2012-06-06 – 2012-06-08 (×3): 1.5 g via INTRAVENOUS
  Filled 2012-06-06 (×4): qty 1.5

## 2012-06-06 MED ORDER — SUCCINYLCHOLINE CHLORIDE 20 MG/ML IJ SOLN
INTRAMUSCULAR | Status: DC | PRN
  Administered 2012-06-06: 120 mg via INTRAVENOUS

## 2012-06-06 MED ORDER — SODIUM CHLORIDE 0.9 % IV SOLN: 250.0000 mL | INTRAVENOUS | Status: DC

## 2012-06-06 MED ORDER — PHENYLEPHRINE HCL 10 MG/ML IJ SOLN
0.0000 ug/min | INTRAVENOUS | Status: DC
  Filled 2012-06-06: qty 2

## 2012-06-06 MED ORDER — DOPAMINE-DEXTROSE 3.2-5 MG/ML-% IV SOLN
3.0000 ug/kg/min | INTRAVENOUS | Status: DC
  Filled 2012-06-06: qty 250

## 2012-06-06 MED ORDER — METOPROLOL TARTRATE 12.5 MG HALF TABLET
12.5000 mg | ORAL_TABLET | Freq: Two times a day (BID) | ORAL | Status: DC
Start: 2012-06-06 — End: 2012-06-07
  Filled 2012-06-06 (×3): qty 1

## 2012-06-06 MED ORDER — LACTATED RINGERS IV SOLN: INTRAVENOUS | Status: DC

## 2012-06-06 MED ORDER — BISACODYL 5 MG PO TBEC
10.0000 mg | DELAYED_RELEASE_TABLET | Freq: Every day | ORAL | Status: DC
  Administered 2012-06-07: 10 mg via ORAL
  Filled 2012-06-06: qty 2

## 2012-06-06 MED ORDER — MAGNESIUM SULFATE 40 MG/ML IJ SOLN
4.0000 g | Freq: Once | INTRAMUSCULAR | Status: AC
  Administered 2012-06-06: 4 g via INTRAVENOUS
  Filled 2012-06-06: qty 100

## 2012-06-06 MED ORDER — ACETAMINOPHEN 500 MG PO TABS
1000.0000 mg | ORAL_TABLET | Freq: Four times a day (QID) | ORAL | Status: DC
  Administered 2012-06-07 – 2012-06-10 (×12): 1000 mg via ORAL
  Filled 2012-06-06 (×17): qty 2

## 2012-06-06 MED ORDER — ALBUMIN HUMAN 5 % IV SOLN
250.0000 mL | INTRAVENOUS | Status: AC | PRN
  Administered 2012-06-06: 250 mL via INTRAVENOUS

## 2012-06-06 MED ORDER — POTASSIUM CHLORIDE 10 MEQ/50ML IV SOLN
10.0000 meq | INTRAVENOUS | Status: AC
  Administered 2012-06-06 (×3): 10 meq via INTRAVENOUS

## 2012-06-06 MED ORDER — PANTOPRAZOLE SODIUM 40 MG PO TBEC: 40.0000 mg | DELAYED_RELEASE_TABLET | Freq: Every day | ORAL | Status: DC

## 2012-06-06 SURGICAL SUPPLY — 116 items
ATTRACTOMAT 16X20 MAGNETIC DRP (DRAPES) ×4 IMPLANT
BAG DECANTER FOR FLEXI CONT (MISCELLANEOUS) ×4 IMPLANT
BANDAGE ELASTIC 4 VELCRO ST LF (GAUZE/BANDAGES/DRESSINGS) ×8 IMPLANT
BANDAGE ELASTIC 6 VELCRO ST LF (GAUZE/BANDAGES/DRESSINGS) ×8 IMPLANT
BANDAGE GAUZE ELAST BULKY 4 IN (GAUZE/BANDAGES/DRESSINGS) ×8 IMPLANT
BENZOIN TINCTURE PRP APPL 2/3 (GAUZE/BANDAGES/DRESSINGS) ×4 IMPLANT
BLADE STERNUM SYSTEM 6 (BLADE) ×4 IMPLANT
BLADE SURG 11 STRL SS (BLADE) ×4 IMPLANT
BLADE SURG ROTATE 9660 (MISCELLANEOUS) ×4 IMPLANT
CANISTER SUCTION 2500CC (MISCELLANEOUS) ×4 IMPLANT
CANN PRFSN .5XCNCT 15X34-48 (MISCELLANEOUS) ×3
CANNULA AORTIC HI-FLOW 6.5M20F (CANNULA) ×4 IMPLANT
CANNULA PRFSN .5XCNCT 15X34-48 (MISCELLANEOUS) ×3 IMPLANT
CANNULA VEN 2 STAGE (MISCELLANEOUS) ×1
CATH CPB KIT GERHARDT (MISCELLANEOUS) ×4 IMPLANT
CATH THORACIC 28FR (CATHETERS) ×4 IMPLANT
CATH THORACIC 36FR (CATHETERS) IMPLANT
CATH THORACIC 36FR RT ANG (CATHETERS) IMPLANT
CLIP RETRACTION 3.0MM CORONARY (MISCELLANEOUS) ×4 IMPLANT
CLIP TI MEDIUM 24 (CLIP) IMPLANT
CLIP TI WIDE RED SMALL 24 (CLIP) IMPLANT
CLOTH BEACON ORANGE TIMEOUT ST (SAFETY) ×4 IMPLANT
CLSR STERI-STRIP ANTIMIC 1/2X4 (GAUZE/BANDAGES/DRESSINGS) ×4 IMPLANT
COVER SURGICAL LIGHT HANDLE (MISCELLANEOUS) ×4 IMPLANT
CRADLE DONUT ADULT HEAD (MISCELLANEOUS) ×4 IMPLANT
DRAIN CHANNEL 28F RND 3/8 FF (WOUND CARE) ×4 IMPLANT
DRAPE CARDIOVASCULAR INCISE (DRAPES) ×1
DRAPE SLUSH/WARMER DISC (DRAPES) ×4 IMPLANT
DRAPE SRG 135X102X78XABS (DRAPES) ×3 IMPLANT
DRSG COVADERM 4X14 (GAUZE/BANDAGES/DRESSINGS) ×4 IMPLANT
ELECT BLADE 4.0 EZ CLEAN MEGAD (MISCELLANEOUS) ×4
ELECT REM PT RETURN 9FT ADLT (ELECTROSURGICAL) ×8
ELECTRODE BLDE 4.0 EZ CLN MEGD (MISCELLANEOUS) ×3 IMPLANT
ELECTRODE REM PT RTRN 9FT ADLT (ELECTROSURGICAL) ×6 IMPLANT
GLOVE BIO SURGEON STRL SZ 6 (GLOVE) ×4 IMPLANT
GLOVE BIO SURGEON STRL SZ 6.5 (GLOVE) ×28 IMPLANT
GLOVE BIO SURGEON STRL SZ7 (GLOVE) IMPLANT
GLOVE BIO SURGEON STRL SZ7.5 (GLOVE) ×12 IMPLANT
GLOVE BIOGEL PI IND STRL 6 (GLOVE) ×12 IMPLANT
GLOVE BIOGEL PI IND STRL 6.5 (GLOVE) ×15 IMPLANT
GLOVE BIOGEL PI IND STRL 7.0 (GLOVE) ×3 IMPLANT
GLOVE BIOGEL PI INDICATOR 6 (GLOVE) ×4
GLOVE BIOGEL PI INDICATOR 6.5 (GLOVE) ×5
GLOVE BIOGEL PI INDICATOR 7.0 (GLOVE) ×1
GOWN PREVENTION PLUS XLARGE (GOWN DISPOSABLE) ×16 IMPLANT
GOWN STRL NON-REIN LRG LVL3 (GOWN DISPOSABLE) ×16 IMPLANT
HEMOSTAT POWDER SURGIFOAM 1G (HEMOSTASIS) ×12 IMPLANT
HEMOSTAT SURGICEL 2X14 (HEMOSTASIS) ×4 IMPLANT
INSERT FOGARTY 61MM (MISCELLANEOUS) IMPLANT
INSERT FOGARTY XLG (MISCELLANEOUS) IMPLANT
KIT BASIN OR (CUSTOM PROCEDURE TRAY) ×4 IMPLANT
KIT ROOM TURNOVER OR (KITS) ×4 IMPLANT
KIT SUCTION CATH 14FR (SUCTIONS) ×8 IMPLANT
KIT VASOVIEW 6 PRO VH 2400 (KITS) ×4 IMPLANT
KIT VASOVIEW W/TROCAR VH 2000 (KITS) ×4 IMPLANT
LEAD PACING MYOCARDI (MISCELLANEOUS) ×4 IMPLANT
LINE EXTENSION DELIVERY (MISCELLANEOUS) ×4 IMPLANT
MARKER GRAFT CORONARY BYPASS (MISCELLANEOUS) ×12 IMPLANT
NS IRRIG 1000ML POUR BTL (IV SOLUTION) ×32 IMPLANT
PACK OPEN HEART (CUSTOM PROCEDURE TRAY) ×4 IMPLANT
PAD ARMBOARD 7.5X6 YLW CONV (MISCELLANEOUS) ×8 IMPLANT
PAD ELECT DEFIB RADIOL ZOLL (MISCELLANEOUS) ×4 IMPLANT
PENCIL BUTTON HOLSTER BLD 10FT (ELECTRODE) ×4 IMPLANT
PUNCH AORTIC ROTATE 4.0MM (MISCELLANEOUS) IMPLANT
PUNCH AORTIC ROTATE 4.5MM 8IN (MISCELLANEOUS) ×4 IMPLANT
PUNCH AORTIC ROTATE 5MM 8IN (MISCELLANEOUS) IMPLANT
SET CARDIOPLEGIA MPS 5001102 (MISCELLANEOUS) ×4 IMPLANT
SOLUTION ANTI FOG 6CC (MISCELLANEOUS) IMPLANT
SPONGE GAUZE 4X4 12PLY (GAUZE/BANDAGES/DRESSINGS) ×8 IMPLANT
SPONGE LAP 18X18 X RAY DECT (DISPOSABLE) ×8 IMPLANT
SPONGE LAP 4X18 X RAY DECT (DISPOSABLE) IMPLANT
SUT BONE WAX W31G (SUTURE) ×4 IMPLANT
SUT MNCRL AB 4-0 PS2 18 (SUTURE) IMPLANT
SUT PROLENE 3 0 SH DA (SUTURE) ×4 IMPLANT
SUT PROLENE 3 0 SH1 36 (SUTURE) ×4 IMPLANT
SUT PROLENE 4 0 RB 1 (SUTURE)
SUT PROLENE 4 0 SH DA (SUTURE) IMPLANT
SUT PROLENE 4 0 TF (SUTURE) ×12 IMPLANT
SUT PROLENE 4-0 RB1 .5 CRCL 36 (SUTURE) IMPLANT
SUT PROLENE 5 0 C 1 36 (SUTURE) ×8 IMPLANT
SUT PROLENE 6 0 C 1 30 (SUTURE) ×4 IMPLANT
SUT PROLENE 6 0 CC (SUTURE) ×20 IMPLANT
SUT PROLENE 7 0 BV 1 (SUTURE) IMPLANT
SUT PROLENE 7 0 BV1 MDA (SUTURE) ×8 IMPLANT
SUT PROLENE 7.0 RB 3 (SUTURE) IMPLANT
SUT PROLENE 8 0 BV175 6 (SUTURE) IMPLANT
SUT SILK  1 MH (SUTURE)
SUT SILK 1 MH (SUTURE) IMPLANT
SUT SILK 2 0 SH CR/8 (SUTURE) IMPLANT
SUT SILK 3 0 SH CR/8 (SUTURE) IMPLANT
SUT STEEL 6MS V (SUTURE) ×8 IMPLANT
SUT STEEL STERNAL CCS#1 18IN (SUTURE) IMPLANT
SUT STEEL SZ 6 DBL 3X14 BALL (SUTURE) ×4 IMPLANT
SUT VIC AB 1 CTX 18 (SUTURE) ×8 IMPLANT
SUT VIC AB 1 CTX 36 (SUTURE)
SUT VIC AB 1 CTX36XBRD ANBCTR (SUTURE) IMPLANT
SUT VIC AB 2-0 CT1 27 (SUTURE) ×1
SUT VIC AB 2-0 CT1 TAPERPNT 27 (SUTURE) ×3 IMPLANT
SUT VIC AB 2-0 CTX 27 (SUTURE) IMPLANT
SUT VIC AB 3-0 SH 27 (SUTURE)
SUT VIC AB 3-0 SH 27X BRD (SUTURE) IMPLANT
SUT VIC AB 3-0 X1 27 (SUTURE) IMPLANT
SUT VICRYL 4-0 PS2 18IN ABS (SUTURE) ×4 IMPLANT
SUTURE E-PAK OPEN HEART (SUTURE) ×4 IMPLANT
SYSTEM SAHARA CHEST DRAIN ATS (WOUND CARE) ×4 IMPLANT
TABLE PACK (MISCELLANEOUS) ×4 IMPLANT
TAPE CLOTH SURG 4X10 WHT LF (GAUZE/BANDAGES/DRESSINGS) ×4 IMPLANT
TAPE PAPER 2X10 WHT MICROPORE (GAUZE/BANDAGES/DRESSINGS) ×4 IMPLANT
TOWEL OR 17X24 6PK STRL BLUE (TOWEL DISPOSABLE) ×8 IMPLANT
TOWEL OR 17X26 10 PK STRL BLUE (TOWEL DISPOSABLE) ×8 IMPLANT
TRAY FOLEY IC TEMP SENS 14FR (CATHETERS) ×4 IMPLANT
TUBE FEEDING 8FR 16IN STR KANG (MISCELLANEOUS) ×4 IMPLANT
TUBE SUCT INTRACARD DLP 20F (MISCELLANEOUS) ×4 IMPLANT
TUBING INSUFFLATION 10FT LAP (TUBING) ×4 IMPLANT
UNDERPAD 30X30 INCONTINENT (UNDERPADS AND DIAPERS) ×4 IMPLANT
WATER STERILE IRR 1000ML POUR (IV SOLUTION) ×8 IMPLANT

## 2012-06-06 NOTE — Preoperative (Signed)
Beta Blockers   Reason not to administer Beta Blockers:Not Applicable 

## 2012-06-06 NOTE — Anesthesia Preprocedure Evaluation (Addendum)
Anesthesia Evaluation  Patient identified by MRN, date of birth, ID band Patient awake    Reviewed: Allergy & Precautions, H&P , NPO status , Patient's Chart, lab work & pertinent test results  History of Anesthesia Complications Negative for: history of anesthetic complications  Airway Mallampati: III TM Distance: <3 FB Neck ROM: Full  Mouth opening: Limited Mouth Opening  Dental  (+) Teeth Intact and Dental Advidsory Given   Pulmonary sleep apnea ,  breath sounds clear to auscultation        Cardiovascular hypertension, + angina + CAD and + Past MI Rhythm:Regular Rate:Normal     Neuro/Psych negative neurological ROS     GI/Hepatic GERD-  ,  Endo/Other  negative endocrine ROS  Renal/GU negative Renal ROS     Musculoskeletal negative musculoskeletal ROS (+)   Abdominal (+) + obese,   Peds  Hematology negative hematology ROS (+)   Anesthesia Other Findings Skin on the dorsum of left hand escoriated in appearance.  Pt. States that he has a history of dermatological problems with that hand and at present it appears normal to him.  Reproductive/Obstetrics                        Anesthesia Physical Anesthesia Plan  ASA: III  Anesthesia Plan: General   Post-op Pain Management:    Induction: Intravenous  Airway Management Planned: Oral ETT  Additional Equipment: Arterial line, PA Cath, TEE and Ultrasound Guidance Line Placement  Intra-op Plan:   Post-operative Plan: Post-operative intubation/ventilation  Informed Consent: I have reviewed the patients History and Physical, chart, labs and discussed the procedure including the risks, benefits and alternatives for the proposed anesthesia with the patient or authorized representative who has indicated his/her understanding and acceptance.   Dental advisory given and Dental Advisory Given  Plan Discussed with: CRNA, Surgeon and  Anesthesiologist  Anesthesia Plan Comments:        Anesthesia Quick Evaluation

## 2012-06-06 NOTE — Procedures (Signed)
Extubation Procedure Note  Patient Details:   Name: [name] DOB: 1956-01-03 MRN: [id number]   Airway Documentation:   Pt extubated to 4L [place]. Sats 93%, No stridor noted. BBS dim and equal. NIF -30, VC 9L  Evaluation  O2 sats: stable throughout and currently acceptable Complications: No apparent complications Patient did tolerate procedure well. Bilateral Breath Sounds:  (Course, dim)     Christie Beckers 06/06/2012, 6:13 PM

## 2012-06-06 NOTE — Anesthesia Postprocedure Evaluation (Signed)
  Anesthesia Post-op Note  Patient: [name]  Procedure(s) Performed: Procedure(s) with comments: CORONARY ARTERY BYPASS GRAFTING (CABG) (N/A) - x4 using right greater saphenous vein and left internal mammary.  INTRAOPERATIVE TRANSESOPHAGEAL ECHOCARDIOGRAM (N/A) ENDOVEIN HARVEST OF GREATER SAPHENOUS VEIN (Right)  Patient Location: SICU  Anesthesia Type:General  Level of Consciousness: sedated and Patient remains intubated per anesthesia plan  Airway and Oxygen Therapy: Patient remains intubated per anesthesia plan and Patient placed on Ventilator (see vital sign flow sheet for setting)  Post-op Pain: none  Post-op Assessment: Post-op Vital signs reviewed  Post-op Vital Signs: stable  Complications: No apparent anesthesia complications

## 2012-06-06 NOTE — Progress Notes (Signed)
Patient ID: [name], male   DOB: 12-Aug-1955, 57 y.o.   MRN: [id number] S/p CABG x 4  Intubated, sedated  BP 116/57  Pulse 74  Temp(Src) 98.4 F (36.9 C) (Core (Comment))  Resp 19  Ht 5\' 6"  (1.676 m)  Wt 213 lb (96.616 kg)  BMI 34.4 kg/m2  SpO2 99%  Good cardiac index  Minimal CT output  Doing well

## 2012-06-06 NOTE — Anesthesia Procedure Notes (Addendum)
Procedure Name: Intubation Date/Time: 06/06/2012 7:38 AM Performed by: Carmela Rima Pre-anesthesia Checklist: Patient identified, Timeout performed, Emergency Drugs available, Suction available and Patient being monitored Patient Re-evaluated:Patient Re-evaluated prior to inductionOxygen Delivery Method: Circle system utilized Preoxygenation: Pre-oxygenation with 100% oxygen Intubation Type: IV induction and Rapid sequence Ventilation: Mask ventilation without difficulty Grade View: Grade III Number of attempts: 3 Airway Equipment and Method: Video-laryngoscopy Placement Confirmation: ETT inserted through vocal cords under direct vision,  positive ETCO2 and breath sounds checked- equal and bilateral Secured at: 23 cm Tube secured with: Tape Dental Injury: Teeth and Oropharynx as per pre-operative assessment  Difficulty Due To: Difficulty was anticipated, Difficult Airway- due to anterior larynx and Difficult Airway- due to limited oral opening Future Recommendations: Recommend- induction with short-acting agent, and alternative techniques readily available    Procedures: Right IJ Theone Murdoch Catheter Insertion: [phone]: The patient was identified and consent obtained.  TO was performed, and full barrier precautions were used.  The skin was anesthetized with lidocaine-4cc plain with 25g needle.  Once the vein was located with the 22 ga. needle using ultrasound guidance , the wire was inserted into the vein.  The wire location was confirmed with ultrasound.  The tissue was dilated and the 8.5 [place] cordis catheter was carefully inserted. Afterwards Theone Murdoch catheter was inserted. PA catheter at 50cm.  The patient tolerated the procedure well.   CE

## 2012-06-06 NOTE — Transfer of Care (Signed)
Immediate Anesthesia Transfer of Care Note  Patient: [name]  Procedure(s) Performed: Procedure(s) with comments: CORONARY ARTERY BYPASS GRAFTING (CABG) (N/A) - x4 using right greater saphenous vein and left internal mammary.  INTRAOPERATIVE TRANSESOPHAGEAL ECHOCARDIOGRAM (N/A) ENDOVEIN HARVEST OF GREATER SAPHENOUS VEIN (Right)  Patient Location: SICU  Anesthesia Type:General  Level of Consciousness: Patient remains intubated per anesthesia plan  Airway & Oxygen Therapy: Patient remains intubated per anesthesia plan and Patient placed on Ventilator (see vital sign flow sheet for setting)  Post-op Assessment: Report given to PACU RN and Post -op Vital signs reviewed and stable  Post vital signs: Reviewed and stable  Complications: No apparent anesthesia complications

## 2012-06-06 NOTE — Progress Notes (Signed)
  Echocardiogram Echocardiogram Transesophageal has been performed.  [name] 06/06/2012, 10:01 AM

## 2012-06-06 NOTE — Progress Notes (Addendum)
Subjective:   Patient is a 57 y.o. male who was admitted with angina. Workup has revealed Angina Ischemic Heart Disease. Cardiac catheterization demonstrated left main disease. Cardiac risk factors include advanced age (older than 23 for men, 50 for women), family history of premature cardiovascular disease, male gender, obesity (BMI >= 30 kg/m2) and sedentary lifestyle  Patient Active Problem List   Diagnosis Date Noted  . Left main coronary artery disease 06/05/2012  . Coronary artery disease 06/05/2012  . Essential hypertension, benign 06/05/2012  . Tobacco abuse 06/05/2012  . Hyperlipidemia 06/05/2012  . Obesity (BMI 30-39.9) 06/05/2012  . Obstructive sleep apnea 06/05/2012  . GERD (gastroesophageal reflux disease) 06/05/2012   Past Medical History  Diagnosis Date  . Coronary artery disease   . Hypertension   . Sleep apnea   . Anginal pain   . Myocardial infarction   . GERD (gastroesophageal reflux disease)   . Smoker   . Hypercholesterolemia   . Left main coronary artery disease 06/05/2012  . Essential hypertension, benign 06/05/2012  . Tobacco abuse 06/05/2012  . Hyperlipidemia 06/05/2012  . Obesity (BMI 30-39.9) 06/05/2012  . Obstructive sleep apnea 06/05/2012    Past Surgical History  Procedure Laterality Date  . Cardiac catheterization    . Tonsillectomy      Prescriptions prior to admission  Medication Sig Dispense Refill  . aspirin EC 81 MG tablet Take 81 mg by mouth every evening.       . cetirizine (ZYRTEC) 10 MG tablet Take 10 mg by mouth every evening.      . cholecalciferol (VITAMIN D) 1000 UNITS tablet Take 2,000 Units by mouth every evening.      Marland Kitchen losartan-hydrochlorothiazide (HYZAAR) 100-12.5 MG per tablet Take 1 tablet by mouth every evening.       . metoprolol tartrate (LOPRESSOR) 25 MG tablet Take 50 mg by mouth every evening.       . rosuvastatin (CRESTOR) 20 MG tablet Take 20 mg by mouth every evening.         Data Review:  CBC:  Lab Results  Component  Value Date   WBC 12.1* 06/06/2012   RBC 5.48 06/06/2012   BMP:  Lab Results  Component Value Date   GLUCOSE 90 06/06/2012   CO2 26 06/06/2012   BUN 15 06/06/2012   CREATININE 0.85 06/06/2012   CALCIUM 9.3 06/06/2012   Coagulation:  Lab Results  Component Value Date   INR 1.03 06/05/2012   APTT 32 06/05/2012   Cardiac markers:  No results found for this basename: ckmb, troponinT, myoglobin   ABGs:  No results found for this basename: ph, po2, pco2, baseExcess     ECG: normal sinus rhythm, no blocks or conduction defects, no ischemic changes   Cath done in [place] out lined by Dr Cornelius Moras, Because the patient was feed just before transfer here CABG was delayed until this am I have seen the patient and discussed CABG with him brother and wife as Dr Cornelius Moras previously did.  The goals risks and alternatives of the planned surgical procedure CABG  have been discussed with the patient in detail. The risks of the procedure including death, infection, stroke, myocardial infarction, bleeding, blood transfusion have all been discussed specifically.  I have quoted [name] a 3 % of perioperative mortality and a complication rate as high as 20 %. The patient's questions have been answered.[name] is willing  to proceed with the planned procedure.  Delight Ovens MD  [address] [place] [address] Office [phone]   Beeper [phone]

## 2012-06-06 NOTE — Brief Op Note (Addendum)
06/05/2012 - 06/06/2012  10:51 AM  PATIENT:  [name]  57 y.o. male  PRE-OPERATIVE DIAGNOSIS:  99 % left main and 100% RCA   POST-OPERATIVE DIAGNOSIS:  Same   PROCEDURE:  Urgent CORONARY ARTERY BYPASS GRAFTING X 4 (LIMA-LAD,SVG-Int1, SVG-Int2, SVG-RCA) ENDOSCOPIC VEIN HARVEST RIGHT LEG  SURGEON:  Delight Ovens, MD  ASSISTANT: Coral Ceo, PA-C  ANESTHESIA:   general  PATIENT CONDITION:  ICU - intubated and hemodynamically stable.  PRE-OPERATIVE WEIGHT: 97 kg

## 2012-06-07 ENCOUNTER — Inpatient Hospital Stay (HOSPITAL_COMMUNITY): Payer: Self-pay

## 2012-06-07 LAB — POCT I-STAT, CHEM 8
BUN: 13 mg/dL (ref 6–23)
BUN: 18 mg/dL (ref 6–23)
Chloride: 104 mEq/L (ref 96–112)
Creatinine, Ser: 0.8 mg/dL (ref 0.50–1.35)
Glucose, Bld: 157 mg/dL — ABNORMAL HIGH (ref 70–99)
Hemoglobin: 14.3 g/dL (ref 13.0–17.0)
Potassium: 3.8 mEq/L (ref 3.5–5.1)
Potassium: 3.9 mEq/L (ref 3.5–5.1)
Sodium: 140 mEq/L (ref 135–145)
TCO2: 27 mmol/L (ref 0–100)

## 2012-06-07 LAB — POCT I-STAT 3, ART BLOOD GAS (G3+)
Bicarbonate: 22.7 mEq/L (ref 20.0–24.0)
O2 Saturation: 93 %
Patient temperature: 36.5
TCO2: 24 mmol/L (ref 0–100)
pCO2 arterial: 43.3 mmHg (ref 35.0–45.0)
pO2, Arterial: 71 mmHg — ABNORMAL LOW (ref 80.0–100.0)

## 2012-06-07 LAB — GLUCOSE, CAPILLARY
Glucose-Capillary: 118 mg/dL — ABNORMAL HIGH (ref 70–99)
Glucose-Capillary: 155 mg/dL — ABNORMAL HIGH (ref 70–99)
Glucose-Capillary: 159 mg/dL — ABNORMAL HIGH (ref 70–99)
Glucose-Capillary: 160 mg/dL — ABNORMAL HIGH (ref 70–99)
Glucose-Capillary: 162 mg/dL — ABNORMAL HIGH (ref 70–99)
Glucose-Capillary: 99 mg/dL (ref 70–99)
Glucose-Capillary: 93 mg/dL (ref 70–99)
Glucose-Capillary: 114 mg/dL — ABNORMAL HIGH (ref 70–99)
Glucose-Capillary: 120 mg/dL — ABNORMAL HIGH (ref 70–99)
Glucose-Capillary: 115 mg/dL — ABNORMAL HIGH (ref 70–99)

## 2012-06-07 LAB — CBC
HCT: 40.2 % (ref 39.0–52.0)
HCT: 40.1 % (ref 39.0–52.0)
Hemoglobin: 14.8 g/dL (ref 13.0–17.0)
Hemoglobin: 14.4 g/dL (ref 13.0–17.0)
MCH: 33.6 pg (ref 26.0–34.0)
MCHC: 35.9 g/dL (ref 30.0–36.0)
MCV: 93.1 fL (ref 78.0–100.0)
MCV: 93.7 fL (ref 78.0–100.0)
Platelets: 129 10*3/uL — ABNORMAL LOW (ref 150–400)
RBC: 4.32 MIL/uL (ref 4.22–5.81)
RBC: 4.28 MIL/uL (ref 4.22–5.81)
RDW: 12.2 % (ref 11.5–15.5)
RDW: 12.4 % (ref 11.5–15.5)
WBC: 18.7 10*3/uL — ABNORMAL HIGH (ref 4.0–10.5)
WBC: 20 10*3/uL — ABNORMAL HIGH (ref 4.0–10.5)

## 2012-06-07 LAB — BASIC METABOLIC PANEL
BUN: 16 mg/dL (ref 6–23)
CO2: 26 mEq/L (ref 19–32)
Calcium: 7.8 mg/dL — ABNORMAL LOW (ref 8.4–10.5)
Chloride: 106 mEq/L (ref 96–112)
Creatinine, Ser: 0.79 mg/dL (ref 0.50–1.35)
GFR calc Af Amer: >90 mL/min (ref >90)
GFR calc non Af Amer: >90 mL/min (ref >90)
Glucose, Bld: 157 mg/dL — ABNORMAL HIGH (ref 70–99)
Potassium: 3.7 mEq/L (ref 3.5–5.1)
Sodium: 139 mEq/L (ref 135–145)

## 2012-06-07 LAB — CREATININE, SERUM
Creatinine, Ser: 0.76 mg/dL (ref 0.50–1.35)
GFR calc Af Amer: >90 mL/min (ref >90)
GFR calc non Af Amer: >90 mL/min (ref >90)

## 2012-06-07 LAB — MAGNESIUM: Magnesium: 2.2 mg/dL (ref 1.5–2.5)

## 2012-06-07 MED ORDER — OXYCODONE HCL 5 MG PO TABS
5.0000 mg | ORAL_TABLET | ORAL | Status: DC | PRN
  Administered 2012-06-07 – 2012-06-09 (×4): 10 mg via ORAL
  Filled 2012-06-07 (×4): qty 2

## 2012-06-07 MED ORDER — BISACODYL 5 MG PO TBEC
10.0000 mg | DELAYED_RELEASE_TABLET | Freq: Every day | ORAL | Status: DC | PRN
  Administered 2012-06-09: 10 mg via ORAL
  Filled 2012-06-07: qty 2

## 2012-06-07 MED ORDER — METOPROLOL TARTRATE 12.5 MG HALF TABLET
12.5000 mg | ORAL_TABLET | Freq: Two times a day (BID) | ORAL | Status: DC
  Administered 2012-06-07 – 2012-06-08 (×3): 12.5 mg via ORAL
  Filled 2012-06-07 (×4): qty 1

## 2012-06-07 MED ORDER — DOCUSATE SODIUM 100 MG PO CAPS
200.0000 mg | ORAL_CAPSULE | Freq: Every day | ORAL | Status: DC
  Administered 2012-06-07 – 2012-06-09 (×3): 200 mg via ORAL
  Filled 2012-06-07 (×3): qty 2

## 2012-06-07 MED ORDER — ENOXAPARIN SODIUM 30 MG/0.3ML ~~LOC~~ SOLN
30.0000 mg | SUBCUTANEOUS | Status: DC
  Administered 2012-06-07 – 2012-06-09 (×3): 30 mg via SUBCUTANEOUS
  Filled 2012-06-07 (×5): qty 0.3

## 2012-06-07 MED ORDER — ACETAMINOPHEN 325 MG PO TABS: 650.0000 mg | ORAL_TABLET | Freq: Four times a day (QID) | ORAL | Status: DC | PRN

## 2012-06-07 MED ORDER — BISACODYL 10 MG RE SUPP: 10.0000 mg | Freq: Every day | RECTAL | Status: DC | PRN

## 2012-06-07 MED ORDER — ALUM & MAG HYDROXIDE-SIMETH 200-200-20 MG/5ML PO SUSP: 15.0000 mL | ORAL | Status: DC | PRN

## 2012-06-07 MED ORDER — INSULIN ASPART 100 UNIT/ML ~~LOC~~ SOLN
0.0000 [IU] | SUBCUTANEOUS | Status: DC
  Administered 2012-06-08 (×2): 2 [IU] via SUBCUTANEOUS

## 2012-06-07 MED ORDER — POTASSIUM CHLORIDE CRYS ER 20 MEQ PO TBCR
20.0000 meq | EXTENDED_RELEASE_TABLET | Freq: Every day | ORAL | Status: DC
  Administered 2012-06-07 – 2012-06-10 (×4): 20 meq via ORAL
  Filled 2012-06-07 (×4): qty 1

## 2012-06-07 MED ORDER — ASPIRIN EC 325 MG PO TBEC
325.0000 mg | DELAYED_RELEASE_TABLET | Freq: Every day | ORAL | Status: DC
  Administered 2012-06-07 – 2012-06-10 (×4): 325 mg via ORAL
  Filled 2012-06-07 (×4): qty 1

## 2012-06-07 MED ORDER — DEXTROSE 50 % IV SOLN
12.0000 mL | Freq: Once | INTRAVENOUS | Status: AC
  Administered 2012-06-07: 12 mL via INTRAVENOUS

## 2012-06-07 MED ORDER — INSULIN ASPART 100 UNIT/ML ~~LOC~~ SOLN
0.0000 [IU] | Freq: Four times a day (QID) | SUBCUTANEOUS | Status: DC
  Administered 2012-06-07: 2 [IU] via SUBCUTANEOUS

## 2012-06-07 MED ORDER — MAGNESIUM HYDROXIDE 400 MG/5ML PO SUSP: 30.0000 mL | Freq: Every day | ORAL | Status: DC | PRN

## 2012-06-07 MED ORDER — ONDANSETRON HCL 4 MG PO TABS: 4.0000 mg | ORAL_TABLET | Freq: Four times a day (QID) | ORAL | Status: DC | PRN

## 2012-06-07 MED ORDER — POTASSIUM CHLORIDE 10 MEQ/50ML IV SOLN
10.0000 meq | INTRAVENOUS | Status: DC | PRN
  Administered 2012-06-07 (×2): 10 meq via INTRAVENOUS
  Filled 2012-06-07: qty 50

## 2012-06-07 MED ORDER — FUROSEMIDE 40 MG PO TABS
40.0000 mg | ORAL_TABLET | Freq: Every day | ORAL | Status: DC
  Administered 2012-06-07 – 2012-06-10 (×4): 40 mg via ORAL
  Filled 2012-06-07 (×4): qty 1

## 2012-06-07 MED ORDER — TRAMADOL HCL 50 MG PO TABS
50.0000 mg | ORAL_TABLET | ORAL | Status: DC | PRN
  Administered 2012-06-07 – 2012-06-08 (×2): 100 mg via ORAL
  Filled 2012-06-07 (×2): qty 2

## 2012-06-07 MED ORDER — INSULIN ASPART 100 UNIT/ML ~~LOC~~ SOLN: 0.0000 [IU] | Freq: Four times a day (QID) | SUBCUTANEOUS | Status: DC

## 2012-06-07 MED ORDER — INSULIN DETEMIR 100 UNIT/ML ~~LOC~~ SOLN
15.0000 [IU] | Freq: Every day | SUBCUTANEOUS | Status: DC
  Administered 2012-06-07 – 2012-06-08 (×2): 15 [IU] via SUBCUTANEOUS
  Filled 2012-06-07 (×3): qty 0.15

## 2012-06-07 MED ORDER — ATORVASTATIN CALCIUM 20 MG PO TABS
20.0000 mg | ORAL_TABLET | Freq: Every day | ORAL | Status: DC
  Administered 2012-06-07 – 2012-06-09 (×3): 20 mg via ORAL
  Filled 2012-06-07 (×4): qty 1

## 2012-06-07 MED ORDER — POTASSIUM CHLORIDE 10 MEQ/50ML IV SOLN
INTRAVENOUS | Status: AC
  Administered 2012-06-07: 10 meq
  Filled 2012-06-07: qty 150

## 2012-06-07 MED ORDER — PANTOPRAZOLE SODIUM 40 MG PO TBEC
40.0000 mg | DELAYED_RELEASE_TABLET | Freq: Every day | ORAL | Status: DC
  Administered 2012-06-08 – 2012-06-10 (×2): 40 mg via ORAL
  Filled 2012-06-07 (×2): qty 1

## 2012-06-07 MED ORDER — MOVING RIGHT ALONG BOOK
Freq: Once | Status: AC
  Administered 2012-06-07: 11:00:00
  Filled 2012-06-07: qty 1

## 2012-06-07 MED ORDER — SODIUM CHLORIDE 0.9 % IJ SOLN
3.0000 mL | INTRAMUSCULAR | Status: DC | PRN
Start: 2012-06-07 — End: 2012-06-10

## 2012-06-07 MED ORDER — ONDANSETRON HCL 4 MG/2ML IJ SOLN
4.0000 mg | Freq: Four times a day (QID) | INTRAMUSCULAR | Status: DC | PRN
  Administered 2012-06-08: 4 mg via INTRAVENOUS
  Filled 2012-06-07: qty 2

## 2012-06-07 MED ORDER — SODIUM CHLORIDE 0.9 % IJ SOLN
3.0000 mL | Freq: Two times a day (BID) | INTRAMUSCULAR | Status: DC
  Administered 2012-06-07 – 2012-06-09 (×5): 3 mL via INTRAVENOUS

## 2012-06-07 MED ORDER — DEXTROSE 50 % IV SOLN
INTRAVENOUS | Status: AC
  Filled 2012-06-07: qty 50

## 2012-06-07 MED ORDER — SODIUM CHLORIDE 0.9 % IV SOLN: 250.0000 mL | INTRAVENOUS | Status: DC | PRN

## 2012-06-07 MED FILL — Electrolyte-R (PH 7.4) Solution: INTRAVENOUS | Qty: 3000 | Status: AC

## 2012-06-07 MED FILL — Sodium Chloride IV Soln 0.9%: INTRAVENOUS | Qty: 1000 | Status: AC

## 2012-06-07 MED FILL — Heparin Sodium (Porcine) Inj 1000 Unit/ML: INTRAMUSCULAR | Qty: 30 | Status: AC

## 2012-06-07 MED FILL — Sodium Bicarbonate IV Soln 8.4%: INTRAVENOUS | Qty: 50 | Status: AC

## 2012-06-07 MED FILL — Lidocaine HCl IV Inj 20 MG/ML: INTRAVENOUS | Qty: 5 | Status: AC

## 2012-06-07 MED FILL — Magnesium Sulfate Inj 50%: INTRAMUSCULAR | Qty: 10 | Status: AC

## 2012-06-07 MED FILL — Sodium Chloride Irrigation Soln 0.9%: Qty: 1000 | Status: AC

## 2012-06-07 MED FILL — Potassium Chloride Inj 2 mEq/ML: INTRAVENOUS | Qty: 40 | Status: AC

## 2012-06-07 NOTE — Progress Notes (Addendum)
TCTS DAILY ICU PROGRESS NOTE                   [address]            [name] [address]          [phone]   1 Day Post-Op Procedure(s) (LRB): CORONARY ARTERY BYPASS GRAFTING (CABG) (N/A) INTRAOPERATIVE TRANSESOPHAGEAL ECHOCARDIOGRAM (N/A) ENDOVEIN HARVEST OF GREATER SAPHENOUS VEIN (Right)  Total Length of Stay:  LOS: 2 days   Subjective: Mild pain, mild nausea  Objective: Vital signs in last 24 hours: Temp:  [97.7 F (36.5 C)-99.3 F (37.4 C)] 99 F (37.2 C) (06/04 0800) Pulse Rate:  [68-113] 105 (06/04 0800) Cardiac Rhythm:  [-] Sinus tachycardia (06/04 0800) Resp:  [12-27] 17 (06/04 0800) BP: (97-121)/(55-64) 121/61 mmHg (06/03 1812) SpO2:  [91 %-100 %] 92 % (06/04 0800) Arterial Line BP: (93-138)/(49-67) 118/54 mmHg (06/04 0800) FiO2 (%):  [40 %-50 %] 40 % (06/03 1740) Weight:  [222 lb 0.1 oz (100.7 kg)] 222 lb 0.1 oz (100.7 kg) (06/04 0400)  Filed Weights   06/05/12 1641 06/07/12 0400  Weight: 213 lb (96.616 kg) 222 lb 0.1 oz (100.7 kg)    Weight change: 9 lb 0.1 oz (4.084 kg)   Hemodynamic parameters for last 24 hours: PAP: (26-43)/(14-31) 34/24 mmHg CO:  [4.3 L/min-7.1 L/min] 6.1 L/min CI:  [2.1 L/min/m2-3.5 L/min/m2] 3 L/min/m2  Intake/Output from previous day: 06/03 0701 - 06/04 0700 In: 6483.5 [I.V.:4855.5; Blood:778; IV Piggyback:850] Out: 5230 [Urine:3360; Blood:1150; Chest Tube:720]  Intake/Output this shift: Total I/O In: 148 [P.O.:50; I.V.:48; IV Piggyback:50] Out: -   Current Meds: Scheduled Meds: . acetaminophen  1,000 mg Oral Q6H   Or  . acetaminophen (TYLENOL) oral liquid 160 mg/5 mL  975 mg Per Tube Q6H  . aspirin EC  325 mg Oral Daily   Or  . aspirin  324 mg Per Tube Daily  . atorvastatin  40 mg Oral q1800  . bisacodyl  10 mg Oral Daily   Or  . bisacodyl  10 mg Rectal Daily  . cefUROXime (ZINACEF)  IV  1.5 g Intravenous Q12H  . cholecalciferol  2,000 Units Oral QPM  . docusate sodium  200 mg Oral Daily  .  insulin regular  0-10 Units Intravenous TID WC  . metoCLOPramide (REGLAN) injection  10 mg Intravenous Q6H  . metoprolol tartrate  12.5 mg Oral BID   Or  . metoprolol tartrate  12.5 mg Per Tube BID  . [START ON 06/08/2012] pantoprazole  40 mg Oral Daily  . sodium chloride  3 mL Intravenous Q12H   Continuous Infusions: . sodium chloride 20 mL/hr at 06/06/12 1330  . sodium chloride 20 mL/hr at 06/06/12 1300  . sodium chloride    . dexmedetomidine 0.1 mcg/kg/hr (06/06/12 1900)  . DOPamine 3 mcg/kg/min (06/07/12 0600)  . insulin (NOVOLIN-R) infusion 3.4 Units/hr (06/07/12 0651)  . lactated ringers    . nitroGLYCERIN    . phenylephrine (NEO-SYNEPHRINE) Adult infusion Stopped (06/07/12 0200)   PRN Meds:.albumin human, metoprolol, midazolam, morphine injection, ondansetron (ZOFRAN) IV, oxyCODONE, potassium chloride, sodium chloride  General appearance: alert, cooperative and no distress Neurologic: intact Heart: regular rate and rhythm Lungs: dim in bases Abdomen: benign Extremities: mild edema Wound: dressings CDI  Lab Results: CBC: Recent Labs  06/06/12 1915 06/07/12 0340  WBC 19.0* 18.7*  HGB 14.7 14.8  HCT 41.0 40.2  PLT 158 143*   BMET:  Recent Labs  06/06/12 0436  06/06/12  1318 06/06/12 1915 06/07/12 0340  NA 138  < > 141  --  139  K 3.6  < > 3.3*  --  3.7  CL 101  --   --   --  106  CO2 26  --   --   --  26  GLUCOSE 90  < > 137*  --  157*  BUN 15  --   --   --  16  CREATININE 0.85  --   --  0.77 0.79  CALCIUM 9.3  --   --   --  7.8*  < > = values in this interval not displayed.  PT/INR:  Recent Labs  06/06/12 1300  LABPROT 17.5*  INR 1.48   Radiology: Dg Chest Portable 1 View In Am  06/07/2012   *RADIOLOGY REPORT*  Clinical Data: Postop  PORTABLE CHEST - 1 VIEW  Comparison: 06/06/2012  Findings: Status post median sternotomy and CABG procedure. Pulmonary arterial catheter is identified with tip in the right pulmonary artery. There is a left-sided chest  tube in place. Atelectasis is noted in the left base and appears similar to previous exam.  IMPRESSION:  1.  Status post extubation and removal of nasogastric tube without complication. 2.  Left-sided chest tube in place without significant pneumothorax. 3.  Low lung volumes and left base atelectasis.   Original Report Authenticated By: Signa Kell, M.D.   Dg Chest Portable 1 View  06/06/2012   *RADIOLOGY REPORT*  Clinical Data: Postop CABG.  PORTABLE CHEST - 1 VIEW  Comparison: 06/05/2012  Findings: Endotracheal tube is in place, tip 4.1 cm above carina. Right-sided Swan-Ganz catheter tip overlies the level of the pulmonary outflow tract.  Nasogastric tube is in place, tip overlying the level of the gastric fundus.  The patient has had interval median sternotomy and CABG.  A left-sided chest tube is in place, tip overlying the left lung apex.  No evidence for pneumothorax.  There is minimal left base atelectasis.  Heart size is prominent. Low lung volumes.  IMPRESSION:  1.  Postoperative changes. 2.  Lines and tubes as described. 3.  Minimal left lower lobe atelectasis.   Original Report Authenticated By: Norva Pavlov, M.D.   Dg Chest Port 1 View  06/05/2012   *RADIOLOGY REPORT*  Clinical Data: Preoperative radiograph.  CHF.  PORTABLE CHEST - 1 VIEW  Comparison: None.  Findings: Cardiopericardial silhouette is borderline for projection.  Mild basilar atelectasis.  No airspace disease.  No effusion.  The mediastinal contours normal. Monitoring leads are projected over the chest.  Suboptimal inspiration.  IMPRESSION: No acute cardiopulmonary disease.   Original Report Authenticated By: Andreas Newport, M.D.     Assessment/Plan: S/P Procedure(s) (LRB): CORONARY ARTERY BYPASS GRAFTING (CABG) (N/A) INTRAOPERATIVE TRANSESOPHAGEAL ECHOCARDIOGRAM (N/A) ENDOVEIN HARVEST OF GREATER SAPHENOUS VEIN (Right) Mobilize Diuresis Diabetes control d/c tubes/lines See progression orders     GOLD,WAYNE  E 06/07/2012 8:17 AM  Patient awake and alert, uop borderline now, will continuedopamine and foley 24 more hours, CR remains normal Still on insulin drip, add levemir Expected Acute  Blood - loss Anemia I have seen and examined [name] and agree with the above assessment  and plan.  Delight Ovens MD Beeper [phone] Office [phone] 06/07/2012 11:59 AM

## 2012-06-07 NOTE — Progress Notes (Signed)
Patient ID: [name], male   DOB: 1955/09/14, 57 y.o.   MRN: [id number]  SICU Evening Rounds:  Hemodynamically stable  Remains on low dose dopamine.  Urine output ok BMET    Component Value Date/Time   NA 140 06/07/2012 1714   K 3.9 06/07/2012 1714   CL 104 06/07/2012 1714   CO2 26 06/07/2012 0340   GLUCOSE 121* 06/07/2012 1714   BUN 18 06/07/2012 1714   CREATININE 0.90 06/07/2012 1714   CALCIUM 7.8* 06/07/2012 0340   GFRNONAA >90 06/07/2012 1700   GFRAA >90 06/07/2012 1700    A/P: stable day.

## 2012-06-07 NOTE — Op Note (Signed)
NAMEELLERY, MERONEY NO.:  [id number]  MEDICAL RECORD NO.:  [id number]  LOCATION:  2302                         FACILITY:  [facility]  PHYSICIAN:  Burna Forts, M.D.DATE OF BIRTH:  [birth year]  DATE OF PROCEDURE:  06/06/2012 DATE OF DISCHARGE:                              OPERATIVE REPORT   Intraoperative transesophageal echocardiographic report.  INDICATIONS FOR PROCEDURE:  Mr. Tungate is a 57 year old gentleman who presents today for coronary artery bypass grafting to be performed by Dr. Sheliah Plane.  He is brought to the holding area the morning of surgery where under sedation and local anesthesia, pulmonary artery and radial arterial lines are begun.  We had been asked by Dr. Tyrone Sage to place the TEE probe for evaluation of cardiac structures and functions during his cardiac procedure.  The patient was then taken to the OR for routine induction of general anesthesia, after which the TEE probe was prepared and passed oropharyngeally into the stomach, then slightly withdrawn for imaging of the cardiac structures.  PRE CARDIOPULMONARY BYPASS TEE EXAMINATION:  Left ventricle and left ventricular chamber is seen initially in the short axis view.  There is mild left ventricular hypertrophy appreciated.  There is normal contractile pattern appreciated.  Good overall ejection fraction with estimated ejection fraction greater than 50% estimated.  Papillary muscles are well outlined.  There are no masses appreciated.  Both long and short axis views were obtained.  Mitral Valve:  The mitral valve apparatus is seen initially in his four- chamber view.  It is thin, compliant, mobile and coapts just below the level of the annular area.  On color Doppler, there is trivial mitral regurgitant flow appreciated.  This is a normal mitral valve apparatus.  Aortic Valve:  The aortic valve is trileaflet.  The leaflets are thin, mobile, open satisfactorily.  There is  neither obstruction to flow of systolic ejection or any aortic insufficiency appreciated.  This is entirely normal aortic valve.  Right atrium and left atrium are of normal chambers and structures.  The interatrial septum is interrogated and is intact.  Right Ventricle:  This is a normal right ventricular chamber, size, and function.  Tricuspid Valve:  This is a normal tricuspid valve.  There is trivial tricuspid regurgitation appreciated.  The patient was placed on cardiopulmonary bypass.  Coronary artery bypass grafting is carried out.  The patient was rewarmed, separated from cardiopulmonary bypass with the initial attempt.  POSTCARDIOPULMONARY BYPASS TEE EXAMINATION:  Left Ventricle:  The left ventricular chamber is seen initially in the short axis view.  There is good overall contractile pattern that remains.  There is some mild dyssynergy in the septal wall appreciated in the early post bypass, but overall remains in satisfactory and normal contractile pattern of the left ventricular chamber.  The rest of the cardiac examination was as previously described without substantial changes.  The patient was ultimately returned to the cardiac intensive care unit in stable condition.          ______________________________ Burna Forts, M.D.     JTM/MEDQ  D:  06/06/2012  T:  06/07/2012  Job:  [id number]

## 2012-06-08 ENCOUNTER — Encounter (HOSPITAL_COMMUNITY): Payer: Self-pay | Admitting: Cardiothoracic Surgery

## 2012-06-08 ENCOUNTER — Inpatient Hospital Stay (HOSPITAL_COMMUNITY): Payer: Self-pay

## 2012-06-08 LAB — BASIC METABOLIC PANEL
Chloride: 102 mEq/L (ref 96–112)
GFR calc Af Amer: >90 mL/min (ref >90)
Potassium: 3.8 mEq/L (ref 3.5–5.1)
Sodium: 138 mEq/L (ref 135–145)

## 2012-06-08 LAB — GLUCOSE, CAPILLARY
Glucose-Capillary: 107 mg/dL — ABNORMAL HIGH (ref 70–99)
Glucose-Capillary: 108 mg/dL — ABNORMAL HIGH (ref 70–99)
Glucose-Capillary: 119 mg/dL — ABNORMAL HIGH (ref 70–99)
Glucose-Capillary: 120 mg/dL — ABNORMAL HIGH (ref 70–99)

## 2012-06-08 LAB — CBC
Platelets: 110 10*3/uL — ABNORMAL LOW (ref 150–400)
RDW: 12.7 % (ref 11.5–15.5)
WBC: 18.3 10*3/uL — ABNORMAL HIGH (ref 4.0–10.5)

## 2012-06-08 MED ORDER — METOPROLOL TARTRATE 25 MG PO TABS
25.0000 mg | ORAL_TABLET | Freq: Two times a day (BID) | ORAL | Status: DC
  Administered 2012-06-08 – 2012-06-10 (×4): 25 mg via ORAL
  Filled 2012-06-08 (×5): qty 1

## 2012-06-08 NOTE — Progress Notes (Signed)
1300-1320 Came to see pt to walk. Has walked twice today already. Looked uncomfortable in bed. Asked pt if he wanted to get to recliner since he would be eating soon. Pt agreed he would like to do that.  To recliner with assistance. Put extension on oxygen for comfort and got pt water. NSR 85-90. Encouraged pt to walk third walk prior to bedtime. Luetta Nutting RNBSN

## 2012-06-08 NOTE — Progress Notes (Signed)
06/08/2012 2000  Standby assist to ambulate with pt and rolling walker approximately 538ft.  Pt tolerated well. No ectopy noted. Standby assist back to chair.  Will continue to monitor. Kenyon Eshleman, Avie Echevaria , RN

## 2012-06-08 NOTE — Progress Notes (Addendum)
TCTS DAILY ICU PROGRESS NOTE                   [address]            [place] [address]          [phone]   2 Days Post-Op Procedure(s) (LRB): CORONARY ARTERY BYPASS GRAFTING (CABG) (N/A) INTRAOPERATIVE TRANSESOPHAGEAL ECHOCARDIOGRAM (N/A) ENDOVEIN HARVEST OF GREATER SAPHENOUS VEIN (Right)  Total Length of Stay:  LOS: 3 days   Subjective: Feels mild nausea   Objective: Vital signs in last 24 hours: Temp:  [97.7 F (36.5 C)-99 F (37.2 C)] 97.7 F (36.5 C) (06/05 0732) Pulse Rate:  [92-113] 104 (06/05 0700) Cardiac Rhythm:  [-] Sinus tachycardia (06/05 0725) Resp:  [15-32] 21 (06/05 0700) BP: (105-140)/(55-90) 125/61 mmHg (06/05 0700) SpO2:  [90 %-95 %] 93 % (06/05 0700) Arterial Line BP: (92-136)/(51-67) 133/52 mmHg (06/04 1700) Weight:  [220 lb 14.4 oz (100.2 kg)] 220 lb 14.4 oz (100.2 kg) (06/05 0500)  Filed Weights   06/05/12 1641 06/07/12 0400 06/08/12 0500  Weight: 213 lb (96.616 kg) 222 lb 0.1 oz (100.7 kg) 220 lb 14.4 oz (100.2 kg)    Weight change: -1 lb 1.6 oz (-0.5 kg)   Hemodynamic parameters for last 24 hours: PAP: (34-37)/(24-26) 37/26 mmHg  Intake/Output from previous day: 06/04 0701 - 06/05 0700 In: 1288.8 [P.O.:320; I.V.:768.8; IV Piggyback:200] Out: 770 [Urine:700; Chest Tube:70]  Intake/Output this shift:    Current Meds: Scheduled Meds: . acetaminophen  1,000 mg Oral Q6H   Or  . acetaminophen (TYLENOL) oral liquid 160 mg/5 mL  975 mg Per Tube Q6H  . aspirin EC  325 mg Oral Daily  . atorvastatin  20 mg Oral q1800  . cefUROXime (ZINACEF)  IV  1.5 g Intravenous Q12H  . cholecalciferol  2,000 Units Oral QPM  . docusate sodium  200 mg Oral Daily  . enoxaparin  30 mg Subcutaneous Q24H  . furosemide  40 mg Oral Daily  . insulin aspart  0-24 Units Subcutaneous Q4H  . insulin detemir  15 Units Subcutaneous Daily  . metoprolol tartrate  12.5 mg Oral BID  . pantoprazole  40 mg Oral QAC breakfast  . potassium chloride  20  mEq Oral Daily  . sodium chloride  3 mL Intravenous Q12H   Continuous Infusions: . sodium chloride 20 mL/hr at 06/06/12 1300  . sodium chloride    . dexmedetomidine 0.1 mcg/kg/hr (06/06/12 1900)  . DOPamine 3 mcg/kg/min (06/07/12 1200)  . lactated ringers    . nitroGLYCERIN    . phenylephrine (NEO-SYNEPHRINE) Adult infusion Stopped (06/07/12 0200)   PRN Meds:.sodium chloride, acetaminophen, alum & mag hydroxide-simeth, bisacodyl, bisacodyl, magnesium hydroxide, metoprolol, midazolam, morphine injection, ondansetron (ZOFRAN) IV, ondansetron, oxyCODONE, potassium chloride, sodium chloride, traMADol  General appearance: alert, cooperative and no distress Neurologic: intact Heart: regular rate and rhythm and tachy at times Lungs: dim in lower fields Abdomen: soft Extremities: + LE edema Wound: dressings CDI  Lab Results: CBC: Recent Labs  06/07/12 1700 06/07/12 1714 06/08/12 0410  WBC 20.0*  --  18.3*  HGB 14.4 14.3 13.9  HCT 40.1 42.0 39.0  PLT 129*  --  110*   BMET:  Recent Labs  06/07/12 0340  06/07/12 1714 06/08/12 0410  NA 139  --  140 138  K 3.7  --  3.9 3.8  CL 106  --  104 102  CO2 26  --   --  28  GLUCOSE 157*  --  121* 121*  BUN 16  --  18 20  CREATININE 0.79  < > 0.90 0.72  CALCIUM 7.8*  --   --  8.6  < > = values in this interval not displayed.  PT/INR:  Recent Labs  06/06/12 1300  LABPROT 17.5*  INR 1.48   Radiology: Dg Chest Portable 1 View In Am  06/07/2012   *RADIOLOGY REPORT*  Clinical Data: Postop  PORTABLE CHEST - 1 VIEW  Comparison: 06/06/2012  Findings: Status post median sternotomy and CABG procedure. Pulmonary arterial catheter is identified with tip in the right pulmonary artery. There is a left-sided chest tube in place. Atelectasis is noted in the left base and appears similar to previous exam.  IMPRESSION:  1.  Status post extubation and removal of nasogastric tube without complication. 2.  Left-sided chest tube in place without  significant pneumothorax. 3.  Low lung volumes and left base atelectasis.   Original Report Authenticated By: Signa Kell, M.D.   Dg Chest Portable 1 View  06/06/2012   *RADIOLOGY REPORT*  Clinical Data: Postop CABG.  PORTABLE CHEST - 1 VIEW  Comparison: 06/05/2012  Findings: Endotracheal tube is in place, tip 4.1 cm above carina. Right-sided Swan-Ganz catheter tip overlies the level of the pulmonary outflow tract.  Nasogastric tube is in place, tip overlying the level of the gastric fundus.  The patient has had interval median sternotomy and CABG.  A left-sided chest tube is in place, tip overlying the left lung apex.  No evidence for pneumothorax.  There is minimal left base atelectasis.  Heart size is prominent. Low lung volumes.  IMPRESSION:  1.  Postoperative changes. 2.  Lines and tubes as described. 3.  Minimal left lower lobe atelectasis.   Original Report Authenticated By: Norva Pavlov, M.D.     Assessment/Plan: S/P Procedure(s) (LRB): CORONARY ARTERY BYPASS GRAFTING (CABG) (N/A) INTRAOPERATIVE TRANSESOPHAGEAL ECHOCARDIOGRAM (N/A) ENDOVEIN HARVEST OF GREATER SAPHENOUS VEIN (Right) Mobilize Diuresis Diabetes control d/c tubes/lines Plan for transfer to step-down: see transfer orders aggressive pulm toilet  GOLD,WAYNE E 06/08/2012 7:56 AM  I have seen and examined [name] and agree with the above assessment  and plan.  Delight Ovens MD Beeper [phone] Office [phone] 06/08/2012 8:15 AM

## 2012-06-08 NOTE — Progress Notes (Signed)
Gershon Crane PA paged and advised to change to [facility] bed as per Dr. Tyrone Sage during rounds this AM.

## 2012-06-08 NOTE — Op Note (Signed)
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  PROCEDURE:  Coronary artery bypass grafting x4 with left internal mammary to the left anterior descending coronary artery, reverse saphenous vein graft to the first intermediate, reverse saphenous vein graft to the second intermediate, reverse saphenous vein graft to the distal right coronary artery with right leg, thigh, and calf endovein harvesting.  SURGEON:  Sheliah Plane, MD  FIRST ASSISTANT:  Coral Ceo, P.A.  BRIEF HISTORY:  The patient is a 57 year old male, who for approximately 2 weeks prior to surgery, had intermittent episodes of chest tightness and severe diaphoresis and feeling of weakness.  He was seen by Dr. Lady Gary and underwent cardiac catheterization in [place] and was found to have a 95% stenosis of the LAD.  Total occlusion of the right coronary artery with collateral filling, and a small distal circumflex branch was 70-80% stenosis.  Overall ventricular function was preserved.  The patient was transferred to [facility] for coronary artery bypass grafting, was seen by Dr. Cornelius Moras and was being considered for emergency surgery.  Unfortunately, he had been fed during his transfer process and was without pain, and had not had pain for several days.  The decision was made to keep him n.p.o. until the morning of surgery and proceed with bypass.  Dr. Francesca Jewett had discussed with him, coronary artery bypass risks and options, and on the morning of surgery I  again reiterated these with him.  He agreed and signed informed consent.  DESCRIPTION OF PROCEDURE:  With appropriate time-out performed, the patient underwent general endotracheal anesthesia.  After placement of arterial line and central line, and Swan-Ganz catheter.  The skin of the chest and legs was prepped with Betadine and draped in usual sterile manner.  Dr. Fabio Asa placed a TEE probe showing preserved LV function. Further details under separate note.  The skin of the chest and legs was prepped with Betadine and draped in usual sterile manner.  Using the [employer] Endovein harvesting system, segment of vein was harvested from the right thigh and calf, and was of good quality and caliber.  Median sternotomy was performed.  Left internal mammary artery was dissected down as pedicle graft.  The distal artery was divided and had good free flow.  The vessel was hydrostatically dilated with oral solution.  The pericardium was opened.  Overall ventricular function appeared preserved.  He was systemically heparinized.  The ascending aorta was cannulated.  The right atrium was cannulated and aortic root vent cardioplegia needle was introduced into the ascending aorta.  The patient was placed on cardiopulmonary bypass 2.4 L/minute/meter squared. Sites of anastomosis were selected and dissected out of the epicardium. There were 2 large intermediate branches supplying  the lateral wall. The circumflex proper as it arose from AV groove was a very small vessel less than 1 mm and was not felt suitable large enough for bypass.  The patient's body temperature was cooled to 32 degrees.  Aortic crossclamp was applied 500 mL.  Cold blood potassium cardioplegia was administered with diastolic arrest of the heart.  Myocardial septal temperature was monitored throughout the crossclamp.  Attention was turned first to the intermediate 1 and intermediate 2 vessels.  These were dissected out of the  intramyocardial position.  The heart was elevated.  The second intermediate was opened and was slightly smaller than the first, but admitted a 1.5 mm probe easily.  Using a running 7-0 Prolene, distal anastomosis was performed.  Attention was then turned to the first intermediate, which was a 1.75 mm vessel using a running 7-0 Prolene, distal anastomosis was performed.  Additional cold blood cardioplegia was administered down the vein grafts intermittently.  Attention was then turned to the distal right coronary artery, which was opened and had a 1.5 mm lumen distally.  Using a running 7-0 Prolene, distal anastomosis was performed with second reverse saphenous vein graft.  The mid LAD was then opened 1.5 mm vessel distally using a running 8-0 Prolene, left internal mammary artery was anastomosed to the left anterior descending coronary artery.  With release of the bulldog on the mammary artery, there was a rise in myocardial septal temperature. Bulldog was placed back on the mammary artery.  Additional cold blood cardioplegia was administered.  Attention was then turned to the proximal anastomosis with crossclamp still in place.  Three punch aortotomies were performed.  Each of the 3 vein grafts were anastomosed to the ascending aorta.  Before complete closure of the anastomoses, the heart was allowed to passively fill and aorta were de-aired.  The aortic cross-clamp was removed with a total crossclamp time of 86 minutes.  The patient spontaneously converted to a sinus rhythm.  Atrial and ventricular pacing wires were applied and he was atrially paced to increase rate.  Sites of anastomosis were inspected and were free of bleeding, but the patient's body temperature rewarmed to 37 degrees.  He was then ventilated and weaned from cardiopulmonary bypass without difficulty.  He remained hemodynamically stable.  He was decannulated in usual fashion.  Protamine sulfate was administered with  operative field hemostatic.  A left pleural tube and a Blake mediastinal drain were left in place.  Pericardium was loosely reapproximated.  Sternum was closed with #6 stainless steel wire.  Fascia was closed with interrupted 0 Vicryl and 3-0 Vicryl subcutaneous tissue, and 4-0 subcuticular stitch in skin edges.  Dry dressings were applied.  Sponge and needle count was reported as correct.  The patient tolerated the procedure without obvious complication and was transferred to the Surgical Intensive Care Unit for further postoperative care, he did not require any blood transfusions during the procedure.  Total pump time was 117 minutes.     Sheliah Plane, MD     EG/MEDQ  D:  06/07/2012  T:  06/08/2012  Job:  [id number]  cc:   Harold Hedge, MD

## 2012-06-09 LAB — GLUCOSE, CAPILLARY
Glucose-Capillary: 108 mg/dL — ABNORMAL HIGH (ref 70–99)
Glucose-Capillary: 120 mg/dL — ABNORMAL HIGH (ref 70–99)

## 2012-06-09 MED ORDER — INSULIN ASPART 100 UNIT/ML ~~LOC~~ SOLN
0.0000 [IU] | SUBCUTANEOUS | Status: DC
  Administered 2012-06-09: 2 [IU] via SUBCUTANEOUS

## 2012-06-09 NOTE — Discharge Summary (Signed)
[address]       [name] [address]             [phone]           Discharge Summary  Name: [name] DOB: [birth year] 57 y.o. MRN: [id number]   Admission Date: 06/05/2012 Discharge Date:     Admitting Diagnosis: Chest pain   Discharge Diagnosis:  Coronary artery disease   Past Medical History  Diagnosis Date  . Coronary artery disease   . Hypertension   . Sleep apnea   . Anginal pain   . Myocardial infarction   . GERD (gastroesophageal reflux disease)   . Smoker   . Hypercholesterolemia   . Left main coronary artery disease 06/05/2012  . Essential hypertension, benign 06/05/2012  . Tobacco abuse 06/05/2012  . Hyperlipidemia 06/05/2012  . Obesity (BMI 30-39.9) 06/05/2012  . Obstructive sleep apnea 06/05/2012     Procedures:  CORONARY ARTERY BYPASS GRAFTING x 4 (Left internal mammary to left anterior descending, saphenous vein graft to first intermediate, saphenous vein graft to second intermediate, saphenous vein graft to distal right coronary artery)  ENDOSCOPIC VEIN HARVEST RIGHT LEG - 06/06/2012    HPI:  The patient is a 57 y.o. male who was in his usual state of health until approximately 6 weeks ago, when he first began to experience one of a total of 4 episodes of postprandial chest discomfort described as tightness radiating across his chest. He states that these episodes always occurred within 30 minutes of a meal in the evening and were associated with nausea and occasional emesis as well as diaphoresis. The last and most recent episode of chest discomfort occurred 10 days ago and was more prolonged, lasting nearly 30 minutes before the symptoms resolved. The patient was evaluated in the office last Tuesday by Dr. Vonita Moss. Apparently, a gallbladder ultrasound was performed and felt to be unremarkable, but an EKG was performed, demonstrating sinus rhythm with inferolateral ST segment depression. Serum troponin levels were  elevated at 3.8. The patient was referred to Dr. Lady Gary, who saw him in the office 3 days ago and scheduled him for elective cardiac catheterization which was performed on the date of this admission at [facility]. Cardiac catheterization is notable for the presence of 99% stenosis of the left main coronary artery with 100% chronic occlusion of the right coronary artery and left to right collaterals filling of the distal right corner circulation. Left ventricular function was preserved. The patient was transferred directly to [facility] for urgent coronary artery bypass grafting.        Hospital Course:  The patient was admitted to [facility] on 06/05/2012.  He was seen by Dr. Cornelius Moras and his films were reviewed, and it was agreed that he would benefit from surgical revascularization.  Unfortunately, just prior to transport, the patient was given lunch, thus precluding immediate surgery.  He remained stable, and it was felt that surgery could be scheduled for the following morning. Dr. Tyrone Sage had an OR availability the next day, so he saw the patient in consult and reviewed his films. All risks, benefits and alternatives of surgery were explained in detail, and the patient agreed to proceed.   The patient was taken to the operating room on 06/06/2012 and underwent the above procedure.    The postoperative course has generally been uneventful.  He is ambulating in the halls and tolerating a regular diet.  He has been started on  a beta blocker, an aspirin, and a statin, but has not been started on an ACE-inhibitor due to low systolic blood pressures.  He was mildly volume overloaded, and was started on Lasix, to which he is responding well.  Currently, the patient is medically stable for discharge home on today's date.     Recent vital signs:  Filed Vitals:   06/09/12 0300  BP: 123/78  Pulse: 92  Temp: 98.9 F (37.2 C)  Resp: 18    Recent laboratory studies:  CBC: Recent Labs   06/07/12 1700 06/07/12 1714 06/08/12 0410  WBC 20.0*  --  18.3*  HGB 14.4 14.3 13.9  HCT 40.1 42.0 39.0  PLT 129*  --  110*   BMET:  Recent Labs  06/07/12 0340  06/07/12 1714 06/08/12 0410  NA 139  --  140 138  K 3.7  --  3.9 3.8  CL 106  --  104 102  CO2 26  --   --  28  GLUCOSE 157*  --  121* 121*  BUN 16  --  18 20  CREATININE 0.79  < > 0.90 0.72  CALCIUM 7.8*  --   --  8.6  < > = values in this interval not displayed.  PT/INR:  Recent Labs  06/06/12 1300  LABPROT 17.5*  INR 1.48      Medication List    STOP taking these medications       losartan-hydrochlorothiazide 100-12.5 MG per tablet  Commonly known as:  HYZAAR      TAKE these medications       aspirin 325 MG EC tablet  Take 1 tablet (325 mg total) by mouth daily.     cetirizine 10 MG tablet  Commonly known as:  ZYRTEC  Take 10 mg by mouth every evening.     cholecalciferol 1000 UNITS tablet  Commonly known as:  VITAMIN D  Take 2,000 Units by mouth every evening.     furosemide 40 MG tablet  Commonly known as:  LASIX  Take 1 tablet (40 mg total) by mouth daily.     metoprolol tartrate 25 MG tablet  Commonly known as:  LOPRESSOR  Take 1 tablet (25 mg total) by mouth 2 (two) times daily.     oxyCODONE 5 MG immediate release tablet  Commonly known as:  Oxy IR/ROXICODONE  Take 1-2 tablets (5-10 mg total) by mouth every 4 (four) hours as needed.     potassium chloride SA 20 MEQ tablet  Commonly known as:  K-DUR,KLOR-CON  Take 1 tablet (20 mEq total) by mouth daily.     rosuvastatin 20 MG tablet  Commonly known as:  CRESTOR  Take 20 mg by mouth every evening.         Discharge Instructions:  The patient is to refrain from driving, heavy lifting or strenuous activity.  May shower daily and clean incisions with soap and water.  May resume regular diet.   Follow Up:   Follow-up Information   Follow up with Delight Ovens, MD On 07/03/2012. (Have a chest x-ray at [facility]  (first floor) at 12:00, then see PA at 1:00 )    Contact information:   [address] [employer] [address] [place] [address] [phone]       Follow up with Dalia Heading., MD. Schedule an appointment as soon as possible for a visit in 2 weeks.   Contact information:   [address] [place] [address] [phone]  Brandau,GINA H 06/09/2012, 8:42 AM

## 2012-06-09 NOTE — Progress Notes (Signed)
CARDIAC REHAB PHASE I   PRE:  Rate/Rhythm: 93SR  BP:  Supine:   Sitting: 126/68  Standing:    SaO2: 97%RA  MODE:  Ambulation: 550 ft   POST:  Rate/Rhythm: 98SR  BP:  Supine:   Sitting: 119/58  Standing:    SaO2: 90%RA 1105-1130 Pt walked 550 ft on RA with handheld asst. Gait steady. Tolerated well. Encouraged IS and walks for pulmonary improvement. Walking in room independently with steady gait.   Luetta Nutting, RN BSN  06/09/2012 11:26 AM

## 2012-06-09 NOTE — Progress Notes (Signed)
Pt has ambulated 2 times this pm, at least 550 feet, gait steady.

## 2012-06-09 NOTE — Progress Notes (Addendum)
       [address]       [name] [address]             [phone]         3 Days Post-Op Procedure(s) (LRB): CORONARY ARTERY BYPASS GRAFTING (CABG) (N/A) INTRAOPERATIVE TRANSESOPHAGEAL ECHOCARDIOGRAM (N/A) ENDOVEIN HARVEST OF GREATER SAPHENOUS VEIN (Right)  Subjective: Feels well this am, no complaints.  Nausea resolved and eating better.   Objective: Vital signs in last 24 hours: Patient Vitals for the past 24 hrs:  BP Temp Temp src Pulse Resp SpO2 Weight  06/09/12 0300 123/78 mmHg 98.9 F (37.2 C) Oral 92 18 92 % 219 lb 12.8 oz (99.701 kg)  06/08/12 2035 132/68 mmHg 98.4 F (36.9 C) Oral 100 18 91 % -  06/08/12 1426 103/65 mmHg 97.4 F (36.3 C) Oral 89 18 97 % -  06/08/12 1117 110/77 mmHg 97.3 F (36.3 C) Oral 84 18 94 % -  06/08/12 1000 108/68 mmHg - - 89 19 96 % -   Current Weight  06/09/12 219 lb 12.8 oz (99.701 kg)  PRE-OPERATIVE WEIGHT: 97 kg    Intake/Output from previous day: 06/05 0701 - 06/06 0700 In: 240 [P.O.:240] Out: 625 [Urine:625]  CBGs 110-107-108   PHYSICAL EXAM:  Heart: RRR Lungs: Decreased BS in bases Wound: Clean and dry Extremities: Mild LE edema    Lab Results: CBC: Recent Labs  06/07/12 1700 06/07/12 1714 06/08/12 0410  WBC 20.0*  --  18.3*  HGB 14.4 14.3 13.9  HCT 40.1 42.0 39.0  PLT 129*  --  110*   BMET:  Recent Labs  06/07/12 0340  06/07/12 1714 06/08/12 0410  NA 139  --  140 138  K 3.7  --  3.9 3.8  CL 106  --  104 102  CO2 26  --   --  28  GLUCOSE 157*  --  121* 121*  BUN 16  --  18 20  CREATININE 0.79  < > 0.90 0.72  CALCIUM 7.8*  --   --  8.6  < > = values in this interval not displayed.  PT/INR:  Recent Labs  06/06/12 1300  LABPROT 17.5*  INR 1.48      Assessment/Plan: S/P Procedure(s) (LRB): CORONARY ARTERY BYPASS GRAFTING (CABG) (N/A) INTRAOPERATIVE TRANSESOPHAGEAL ECHOCARDIOGRAM (N/A) ENDOVEIN HARVEST OF GREATER SAPHENOUS VEIN (Right)  CV- SR, BPs controlled.   Continue current meds.  Vol overload- diurese.  CBGs stable, will wean and d/c Levemir.  A1C=5.5.  CRPI, pulm toilet.  Leukocytosis, no fever.  Follow labs. WBC trending down.  Hopefully home this weekend if he continues to progress.   LOS: 4 days    [name] 06/09/2012  Poss home in am  I have seen and examined [name] and agree with the above assessment  and plan.  Thomas Ovens MD Beeper [phone] Office [phone] 06/09/2012 1:29 PM

## 2012-06-10 LAB — CBC
MCH: 33 pg (ref 26.0–34.0)
MCHC: 35.3 g/dL (ref 30.0–36.0)
Platelets: 170 10*3/uL (ref 150–400)
RDW: 12.6 % (ref 11.5–15.5)

## 2012-06-10 LAB — BASIC METABOLIC PANEL
BUN: 21 mg/dL (ref 6–23)
Calcium: 8.8 mg/dL (ref 8.4–10.5)
GFR calc non Af Amer: >90 mL/min (ref >90)
Glucose, Bld: 110 mg/dL — ABNORMAL HIGH (ref 70–99)

## 2012-06-10 LAB — GLUCOSE, CAPILLARY: Glucose-Capillary: 93 mg/dL (ref 70–99)

## 2012-06-10 MED ORDER — OXYCODONE HCL 5 MG PO TABS: 5.0000 mg | ORAL_TABLET | ORAL | Status: DC | PRN

## 2012-06-10 MED ORDER — ASPIRIN 325 MG PO TBEC: 325.0000 mg | DELAYED_RELEASE_TABLET | Freq: Every day | ORAL | Status: DC

## 2012-06-10 MED ORDER — METOPROLOL TARTRATE 25 MG PO TABS: 25.0000 mg | ORAL_TABLET | Freq: Two times a day (BID) | ORAL | Status: DC

## 2012-06-10 MED ORDER — POTASSIUM CHLORIDE CRYS ER 20 MEQ PO TBCR: 20.0000 meq | EXTENDED_RELEASE_TABLET | Freq: Every day | ORAL | Status: DC

## 2012-06-10 MED ORDER — FUROSEMIDE 40 MG PO TABS: 40.0000 mg | ORAL_TABLET | Freq: Every day | ORAL | Status: DC

## 2012-06-10 NOTE — Progress Notes (Signed)
Discharge instructions given to pt and pt's wife along with new prescriptions. Pt verbalized his understanding. Educated pt about incision care, signs of infections, signs of HF, etc. Pt is stable for discharge with his wife. Will call for wheelchair when pt is dressed and ready to go.

## 2012-06-10 NOTE — Progress Notes (Signed)
Pt ambulated about 450 ft this pm, gait steady, denies any pain. Pt assisted back to chair and resting, call light placed within reach., will continue plan of care.

## 2012-06-10 NOTE — Progress Notes (Signed)
[address]       [place] [address]             [phone]      4 Days Post-Op  Procedure(s) (LRB): CORONARY ARTERY BYPASS GRAFTING (CABG) (N/A) INTRAOPERATIVE TRANSESOPHAGEAL ECHOCARDIOGRAM (N/A) ENDOVEIN HARVEST OF GREATER SAPHENOUS VEIN (Right) Subjective: Feels well  Objective  Telemetry sinus rhythm  Temp:  [98.3 F (36.8 C)-98.9 F (37.2 C)] 98.9 F (37.2 C) (06/07 0413) Pulse Rate:  [86-93] 91 (06/07 0413) Resp:  [18-20] 19 (06/07 0413) BP: (117-131)/(68-77) 131/77 mmHg (06/07 0413) SpO2:  [91 %-94 %] 94 % (06/07 0413) Weight:  [217 lb 14.4 oz (98.839 kg)] 217 lb 14.4 oz (98.839 kg) (06/07 0413)   Intake/Output Summary (Last 24 hours) at 06/10/12 0728 Last data filed at 06/09/12 1900  Gross per 24 hour  Intake    840 ml  Output    100 ml  Net    740 ml       General appearance: alert, cooperative and no distress Heart: regular rate and rhythm Lungs: clear to auscultation bilaterally Abdomen: benign Extremities: + LE edema Wound: incisios healing well  Lab Results:  Recent Labs  06/07/12 1700  06/08/12 0410 06/10/12 0535  NA  --   < > 138 138  K  --   < > 3.8 3.6  CL  --   < > 102 98  CO2  --   --  28 27  GLUCOSE  --   < > 121* 110*  BUN  --   < > 20 21  CREATININE 0.76  < > 0.72 0.82  CALCIUM  --   --  8.6 8.8  MG 2.2  --   --   --   < > = values in this interval not displayed. No results found for this basename: AST, ALT, ALKPHOS, BILITOT, PROT, ALBUMIN,  in the last 72 hours No results found for this basename: LIPASE, AMYLASE,  in the last 72 hours  Recent Labs  06/08/12 0410 06/10/12 0535  WBC 18.3* 13.0*  HGB 13.9 14.0  HCT 39.0 39.7  MCV 94.9 93.6  PLT 110* 170   No results found for this basename: CKTOTAL, CKMB, TROPONINI,  in the last 72 hours No components found with this basename: POCBNP,  No results found for this basename: DDIMER,  in the last 72 hours No results found for this basename:  HGBA1C,  in the last 72 hours No results found for this basename: CHOL, HDL, LDLCALC, TRIG, CHOLHDL,  in the last 72 hours No results found for this basename: TSH, T4TOTAL, FREET3, T3FREE, THYROIDAB,  in the last 72 hours No results found for this basename: VITAMINB12, FOLATE, FERRITIN, TIBC, IRON, RETICCTPCT,  in the last 72 hours  Medications: Scheduled . acetaminophen  1,000 mg Oral Q6H   Or  . acetaminophen (TYLENOL) oral liquid 160 mg/5 mL  975 mg Per Tube Q6H  . aspirin EC  325 mg Oral Daily  . atorvastatin  20 mg Oral q1800  . docusate sodium  200 mg Oral Daily  . enoxaparin  30 mg Subcutaneous Q24H  . furosemide  40 mg Oral Daily  . insulin aspart  0-24 Units Subcutaneous Q4H  . metoprolol tartrate  25 mg Oral BID  . pantoprazole  40 mg Oral QAC breakfast  . potassium chloride  20 mEq Oral Daily  . sodium chloride  3 mL Intravenous Q12H     Radiology/Studies:  No results found.  INR: Will add last result for INR, ABG once components are confirmed Will add last 4 CBG results once components are confirmed  Assessment/Plan: S/P Procedure(s) (LRB): CORONARY ARTERY BYPASS GRAFTING (CABG) (N/A) INTRAOPERATIVE TRANSESOPHAGEAL ECHOCARDIOGRAM (N/A) ENDOVEIN HARVEST OF GREATER SAPHENOUS VEIN (Right)  1 very stable, appears ready for D/C. Will D/C pacer wires this am . Will cont diuretic as outpatient   LOS: 5 days    [name] 6/7/20147:28 AM

## 2012-06-10 NOTE — Progress Notes (Signed)
[id number] Cardiac Rehab Completed discharge education with pt and wife. They voice understanding. Pt agrees for me to send a letter of interest  to [facility] in [place]. Discussed smoking cessation with pt. He states that he has quit. He plans to do it cold [place]. Gave pt tips for quitting and coaching contact number. Pt seems very motivated to stop smoking. Melina Copa RN

## 2012-06-10 NOTE — Progress Notes (Signed)
Discontinued EPW and CT sutures per MD order. EPW tips were intact, no bleeding noted. Placed steri strips and benzoin on site where CT were removed. Pt on bed rest for one hour with frequent vitals set up. Will continue to monitor.

## 2012-06-28 ENCOUNTER — Other Ambulatory Visit: Payer: Self-pay | Admitting: *Deleted

## 2012-06-28 DIAGNOSIS — I2510 Atherosclerotic heart disease of native coronary artery without angina pectoris: Secondary | ICD-10-CM

## 2012-07-03 ENCOUNTER — Ambulatory Visit
Admission: RE | Admit: 2012-07-03 | Discharge: 2012-07-03 | Disposition: A | Discharge status: Home / Self Care | Payer: Self-pay | Source: Ambulatory Visit | Attending: Cardiothoracic Surgery | Admitting: Cardiothoracic Surgery

## 2012-07-03 ENCOUNTER — Ambulatory Visit (INDEPENDENT_AMBULATORY_CARE_PROVIDER_SITE_OTHER): Payer: Self-pay | Admitting: Surgical

## 2012-07-03 VITALS — BP 159/96 | HR 89 | Resp 16 | Ht 66.0 in | Wt 207.0 lb

## 2012-07-03 DIAGNOSIS — Z951 Presence of aortocoronary bypass graft: Secondary | ICD-10-CM

## 2012-07-03 DIAGNOSIS — I2510 Atherosclerotic heart disease of native coronary artery without angina pectoris: Secondary | ICD-10-CM

## 2012-07-03 MED ORDER — TRAMADOL HCL 50 MG PO TABS: 50.0000 mg | ORAL_TABLET | Freq: Four times a day (QID) | ORAL | Status: DC | PRN

## 2012-07-03 NOTE — Patient Instructions (Signed)
The patient is instructed on pain medication usage. The patient is instructed on followup regarding hemorrhoidal bleeding. Patient is instructed on beginning cardiac rehabilitation. The patient is instructed to not drive until pain is better controlled.

## 2012-07-03 NOTE — Progress Notes (Signed)
[address]       [place] [address]             [phone]                  Jamyson Jirak [facility] Medical Record #[id number] Date of Birth: 12-21-1955  Dalia Heading, MD Bosie Clos, MD  Chief Complaint:   PostOp Follow Up Visit   History of Present Illness:    The patient is seen in routine followup following coronary artery bypass grafting x4 on 06/06/2012 for degrees and 95% left main and total occlusion of the right coronary artery. He is primarily complaining of issues related to pain. His sternal and incisional. He only takes Tylenol due to the fact that the oxycodone makes him feel poorly due to oversedation. This is causing him to continue to sleep in a chair and he remains quite uncomfortable most of the day. He denies significant shortness of breath. He does have some lower extremity edema and has been put on further Lasix by his cardiologist Dr. Lady Gary. He has some mild constipation and has had some bleeding which he feels is related to hemorrhoids.           History  Smoking status  . Current Every Day Smoker -- 1.50 packs/day for 37 years  . Types: Cigarettes  Smokeless tobacco  . Never Used       Allergies  Allergen Reactions  . Sulfa Antibiotics     "Makes him feel worse then better when taken"per wife    Current Outpatient Prescriptions  Medication Sig Dispense Refill  . aspirin EC 325 MG EC tablet Take 1 tablet (325 mg total) by mouth daily.      . cetirizine (ZYRTEC) 10 MG tablet Take 10 mg by mouth every evening.      . cholecalciferol (VITAMIN D) 1000 UNITS tablet Take 2,000 Units by mouth every evening.      . furosemide (LASIX) 40 MG tablet Take 1 tablet (40 mg total) by mouth daily.  14 tablet  0  . oxyCODONE (OXY IR/ROXICODONE) 5 MG immediate release tablet Take 1-2 tablets (5-10 mg total) by mouth every 4 (four) hours as needed.  50 tablet  0  . potassium chloride SA (K-DUR,KLOR-CON) 20 MEQ tablet Take 1  tablet (20 mEq total) by mouth daily.  14 tablet  0  . rosuvastatin (CRESTOR) 20 MG tablet Take 20 mg by mouth every evening.       . metoprolol tartrate (LOPRESSOR) 25 MG tablet Take 1 tablet (25 mg total) by mouth 2 (two) times daily.  60 tablet  1  . traMADol (ULTRAM) 50 MG tablet Take 1 tablet (50 mg total) by mouth every 6 (six) hours as needed for pain.  40 tablet  0   No current facility-administered medications for this visit.       Physical Exam: BP 159/96  Pulse 89  Resp 16  Ht 5\' 6"  (1.676 m)  Wt 207 lb (93.895 kg)  BMI 33.43 kg/m2  SpO2 97%  General appearance: alert, cooperative and no distress Heart: regular rate and rhythm Lungs: clear to auscultation bilaterally Abdomen: Benign exam Extremities: Mild right greater than left lower extremity edema Wound: Incisions healing without evidence of infection   Diagnostic Studies & Laboratory data:         Recent Radiology Findings: Dg Chest 2 View  07/03/2012   *RADIOLOGY REPORT*  Clinical Data: Status post CABG.  CHEST - 2 VIEW  Comparison: Chest x-ray 06/08/2012.  Findings: Previously noted right IJ central venous catheter and epicardial pacing wires have been removed.  Postoperative changes of median sternotomy for CABG are redemonstrated.  A small left pleural effusion persists with minimal left basilar subsegmental atelectasis.  No definite acute consolidative air space disease. No evidence of pulmonary edema.  Heart size is within normal limits.  The mediastinal contours are unremarkable. Atherosclerosis in the thoracic aorta.  IMPRESSION: 1.  Postoperative changes, as above. 2.  Persistent small left pleural effusion with resolving left basilar subsegmental atelectasis. 3.  Atherosclerosis.   Original Report Authenticated By: Trudie Reed, M.D.      Recent Labs: Lab Results  Component Value Date   WBC 13.0* 06/10/2012   HGB 14.0 06/10/2012   HCT 39.7 06/10/2012   PLT 170 06/10/2012   GLUCOSE 110* 06/10/2012   CHOL  128 06/06/2012   TRIG 111 06/06/2012   HDL 38* 06/06/2012   LDLCALC 68 06/06/2012   ALT 24 06/05/2012   AST 21 06/05/2012   NA 138 06/10/2012   K 3.6 06/10/2012   CL 98 06/10/2012   CREATININE 0.82 06/10/2012   BUN 21 06/10/2012   CO2 27 06/10/2012   INR 1.48 06/06/2012   HGBA1C 5.5 06/06/2012      Assessment / Plan:  Overall the patient is doing well. He continues to have some mild volume overload and is being treated per cardiology with Lasix. I discussed the constipation with him and he will continue to use over-the-counter remedies and if he continues to have hemorrhoidal bleeding he will followup with his primary physician. I also instructed him on over-the-counter meds he continues for hemorrhoids. I've also given a prescription for Ultram to try to obtain better pain control. We will see again in the office in 3 weeks.          Jayden Kratochvil E 07/03/2012 1:23 PM

## 2012-07-19 ENCOUNTER — Other Ambulatory Visit: Payer: Self-pay | Admitting: Surgical

## 2012-07-25 ENCOUNTER — Other Ambulatory Visit: Payer: Self-pay | Admitting: *Deleted

## 2012-07-25 DIAGNOSIS — I2510 Atherosclerotic heart disease of native coronary artery without angina pectoris: Secondary | ICD-10-CM

## 2012-07-27 ENCOUNTER — Ambulatory Visit
Admission: RE | Admit: 2012-07-27 | Discharge: 2012-07-27 | Disposition: A | Discharge status: Home / Self Care | Payer: No Typology Code available for payment source | Source: Ambulatory Visit | Attending: Cardiothoracic Surgery | Admitting: Cardiothoracic Surgery

## 2012-07-27 ENCOUNTER — Ambulatory Visit: Payer: Self-pay | Admitting: Cardiothoracic Surgery

## 2012-07-27 ENCOUNTER — Encounter: Payer: Self-pay | Admitting: Cardiothoracic Surgery

## 2012-07-27 ENCOUNTER — Ambulatory Visit (INDEPENDENT_AMBULATORY_CARE_PROVIDER_SITE_OTHER): Payer: Self-pay | Admitting: Cardiothoracic Surgery

## 2012-07-27 VITALS — BP 122/75 | HR 51 | Resp 20 | Ht 66.0 in | Wt 208.0 lb

## 2012-07-27 DIAGNOSIS — I2510 Atherosclerotic heart disease of native coronary artery without angina pectoris: Secondary | ICD-10-CM

## 2012-07-27 DIAGNOSIS — Z951 Presence of aortocoronary bypass graft: Secondary | ICD-10-CM

## 2012-07-27 NOTE — Progress Notes (Signed)
[address]       [place] [address]             [phone]                  [name] [facility] Medical Record #[id number] Date of Birth: 26-Dec-1955  Dr Mardelle Matte, MD  Chief Complaint:   PostOp Follow Up Visit 06/06/2012  PREOPERATIVE DIAGNOSIS: Greater than 95% left main, total occlusion of  the right coronary artery.  POSTOPERATIVE DIAGNOSIS: Greater than 95% left main, total occlusion of  the right coronary artery.  PROCEDURE: Coronary artery bypass grafting x4 with left internal  mammary to the left anterior descending coronary artery, reverse  saphenous vein graft to the first intermediate, reverse saphenous vein  graft to the second intermediate, reverse saphenous vein graft to the  distal right coronary artery with right leg, thigh, and calf endovein  harvesting.   History of Present Illness:     Patient underwent urgent coronary artery bypass grafting in early June for critical left main disease. [name] been making good progress postoperatively without recurrent symptoms. [name] been gaining weight appropriately. He required some increase in Lasix early after discharge with his lower extremity, the edema edema has now resolved. The patient was unable to afford cardiac rehabilitation, but is ambulating at home at least 15 minutes twice a day and slowly increasing his effort without difficulty.          History  Smoking status  . Former Smoker -- 1.50 packs/day for 37 years  . Types: Cigarettes  . Quit date: 06/05/2012  Smokeless tobacco  . Never Used       Allergies  Allergen Reactions  . Sulfa Antibiotics     "Makes him feel worse then better when taken"per wife    Current Outpatient Prescriptions  Medication Sig Dispense Refill  . aspirin EC 325 MG EC tablet Take 1 tablet (325 mg total) by mouth daily.      . cetirizine (ZYRTEC) 10 MG tablet Take 10 mg by mouth every evening.      . cholecalciferol  (VITAMIN D) 1000 UNITS tablet Take 2,000 Units by mouth every evening.      . metoprolol tartrate (LOPRESSOR) 25 MG tablet Take 1 tablet (25 mg total) by mouth 2 (two) times daily.  60 tablet  1  . rosuvastatin (CRESTOR) 20 MG tablet Take 20 mg by mouth every evening.       . traMADol (ULTRAM) 50 MG tablet TAKE 1 TABLET EVERY 6 HOURS AS NEEDED FOR PAIN  40 tablet  0   No current facility-administered medications for this visit.       Physical Exam: BP 122/75  Pulse 51  Resp 20  Ht 5\' 6"  (1.676 m)  Wt 208 lb (94.348 kg)  BMI 33.59 kg/m2  SpO2 98%  General appearance: alert and cooperative Neurologic: intact Heart: regular rate and rhythm, S1, S2 normal, no murmur, click, rub or gallop Lungs: clear to auscultation bilaterally Abdomen: soft, non-tender; bowel sounds normal; no masses,  no organomegaly Extremities: extremities normal, atraumatic, no cyanosis or edema and Homans sign is negative, no sign of DVT Wound: stable without infection   Diagnostic Studies & Laboratory data:         Recent Radiology Findings: Dg Chest 2 View  07/27/2012   *RADIOLOGY REPORT*  Clinical Data: CABG in June, follow-up, former smoking history  CHEST - 2 VIEW  Comparison:  Chest x-ray of 07/03/2012  Findings: Only a tiny left pleural effusion remains.  The right lung is clear.  Cardiomegaly is stable.  Median sternotomy sutures appear intact.  IMPRESSION: Decrease in small left pleural effusion.   Original Report Authenticated By: Dwyane Dee, M.D.      Recent Labs: Lab Results  Component Value Date   WBC 13.0* 06/10/2012   HGB 14.0 06/10/2012   HCT 39.7 06/10/2012   PLT 170 06/10/2012   GLUCOSE 110* 06/10/2012   CHOL 128 06/06/2012   TRIG 111 06/06/2012   HDL 38* 06/06/2012   LDLCALC 68 06/06/2012   ALT 24 06/05/2012   AST 21 06/05/2012   NA 138 06/10/2012   K 3.6 06/10/2012   CL 98 06/10/2012   CREATININE 0.82 06/10/2012   BUN 21 06/10/2012   CO2 27 06/10/2012   INR 1.48 06/06/2012   HGBA1C 5.5 06/06/2012       Assessment / Plan:      Stable 5 weeks after urgent coronary artery bypass grafting. Good postoperative progress Cautioned patient not to do any heavy lifting over 20 pounds for several months, he is returning to driving his automobile, and has been encouraged to continue on a regular exercise schedule. We'll plan to see him back as needed or as requested by Dr. Lady Gary.    [name] 07/27/2012 3:27 PM

## 2012-08-05 ENCOUNTER — Other Ambulatory Visit: Payer: Self-pay | Admitting: Surgical

## 2012-08-11 ENCOUNTER — Other Ambulatory Visit: Payer: Self-pay | Admitting: Surgical

## 2012-08-11 DIAGNOSIS — G8918 Other acute postprocedural pain: Secondary | ICD-10-CM

## 2012-09-01 ENCOUNTER — Other Ambulatory Visit: Payer: Self-pay | Admitting: Cardiothoracic Surgery

## 2012-09-01 DIAGNOSIS — G8918 Other acute postprocedural pain: Secondary | ICD-10-CM

## 2012-09-05 ENCOUNTER — Other Ambulatory Visit: Payer: Self-pay | Admitting: *Deleted

## 2012-09-05 DIAGNOSIS — G8918 Other acute postprocedural pain: Secondary | ICD-10-CM

## 2012-09-05 MED ORDER — TRAMADOL HCL 50 MG PO TABS: 50.0000 mg | ORAL_TABLET | Freq: Four times a day (QID) | ORAL | Status: DC | PRN

## 2012-11-09 ENCOUNTER — Other Ambulatory Visit: Payer: Self-pay

## 2012-11-20 ENCOUNTER — Ambulatory Visit: Payer: Self-pay | Admitting: Family Medicine

## 2013-08-27 LAB — CBC AND DIFFERENTIAL
HEMATOCRIT: 50 % (ref 41–53)
HEMOGLOBIN: 16.9 g/dL (ref 13.5–17.5)
Platelets: 187 10*3/uL (ref 150–399)
WBC: 8.6 10^3/mL

## 2013-08-27 LAB — LIPID PANEL
CHOLESTEROL: 155 mg/dL (ref 0–200)
HDL: 45 mg/dL (ref 35–70)
LDL Cholesterol: 84 mg/dL
Triglycerides: 130 mg/dL (ref 40–160)

## 2013-08-27 LAB — BASIC METABOLIC PANEL
BUN: 19 mg/dL (ref 4–21)
CREATININE: 1.1 mg/dL (ref <=1.3)
Glucose: 88 mg/dL
Potassium: 3.8 mmol/L (ref 3.4–5.3)
Sodium: 143 mmol/L (ref 137–147)

## 2013-08-27 LAB — TSH: TSH: 1.9 u[IU]/mL (ref <=5.90)

## 2013-08-27 LAB — HEPATIC FUNCTION PANEL
ALT: 19 U/L (ref 10–40)
AST: 24 U/L (ref 14–40)

## 2013-08-27 LAB — PSA: PSA: 1.3

## 2013-10-04 IMAGING — CR DG CHEST 1V PORT
1 series · 1 of 1 positions shown · non-contrast
Comparison: None.

CLINICAL DATA: Preoperative radiograph.  CHF.

PORTABLE CHEST - 1 VIEW

[AP]
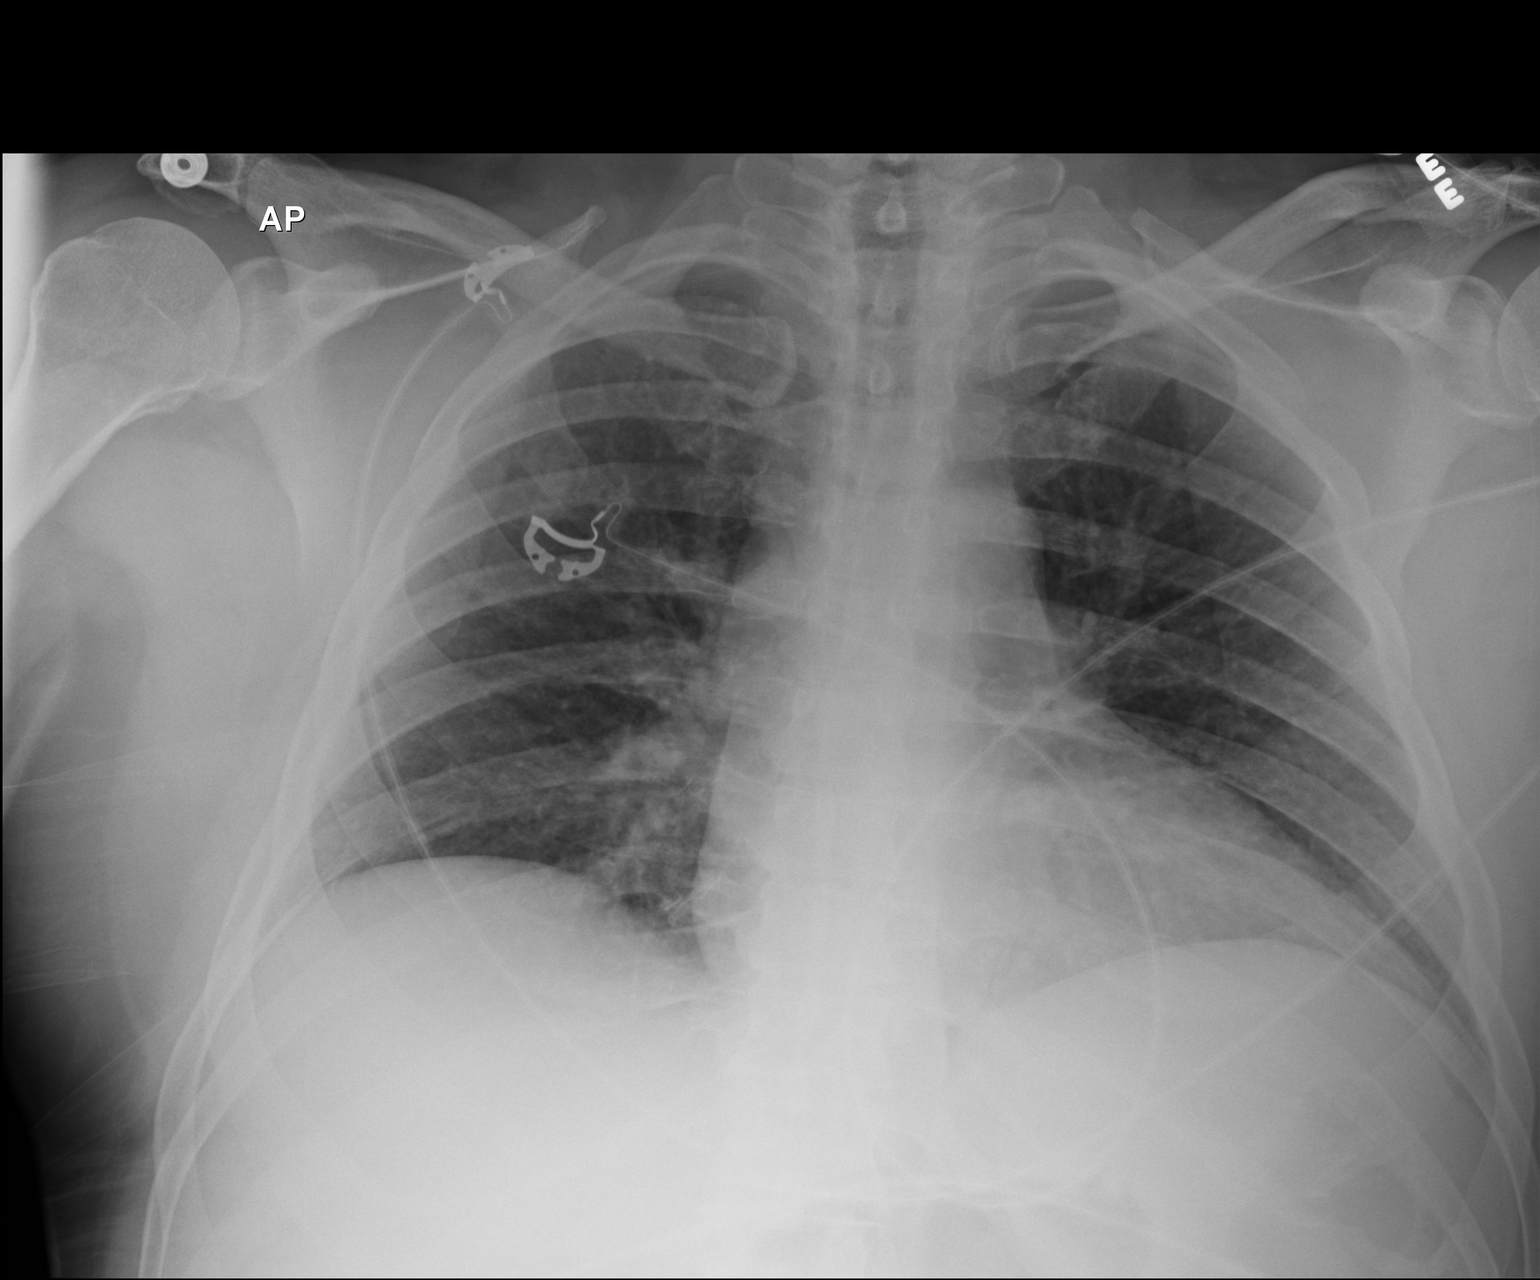

[1 of 1 positions shown; findings below may reference images not displayed]

FINDINGS: Cardiopericardial silhouette is borderline for
projection.  Mild basilar atelectasis.  No airspace disease.  No
effusion.  The mediastinal contours normal. Monitoring leads are
projected over the chest.  Suboptimal inspiration.
IMPRESSION: No acute cardiopulmonary disease.

## 2014-01-17 ENCOUNTER — Encounter (HOSPITAL_COMMUNITY): Payer: Self-pay | Admitting: Thoracic Surgery (Cardiothoracic Vascular Surgery)

## 2014-07-18 ENCOUNTER — Observation Stay
Admission: EM | Admit: 2014-07-18 | Discharge: 2014-07-20 | Disposition: A | Discharge status: Home / Self Care | Payer: BLUE CROSS/BLUE SHIELD | Attending: Internal Medicine | Admitting: Internal Medicine

## 2014-07-18 ENCOUNTER — Encounter: Payer: Self-pay | Admitting: Emergency Medicine

## 2014-07-18 ENCOUNTER — Emergency Department: Payer: BLUE CROSS/BLUE SHIELD

## 2014-07-18 DIAGNOSIS — Z6830 Body mass index (BMI) 30.0-30.9, adult: Secondary | ICD-10-CM | POA: Diagnosis not present

## 2014-07-18 DIAGNOSIS — E785 Hyperlipidemia, unspecified: Secondary | ICD-10-CM | POA: Insufficient documentation

## 2014-07-18 DIAGNOSIS — G4730 Sleep apnea, unspecified: Secondary | ICD-10-CM | POA: Diagnosis not present

## 2014-07-18 DIAGNOSIS — Z87891 Personal history of nicotine dependence: Secondary | ICD-10-CM | POA: Diagnosis not present

## 2014-07-18 DIAGNOSIS — I10 Essential (primary) hypertension: Principal | ICD-10-CM | POA: Insufficient documentation

## 2014-07-18 DIAGNOSIS — G4733 Obstructive sleep apnea (adult) (pediatric): Secondary | ICD-10-CM | POA: Insufficient documentation

## 2014-07-18 DIAGNOSIS — E780 Pure hypercholesterolemia: Secondary | ICD-10-CM | POA: Diagnosis not present

## 2014-07-18 DIAGNOSIS — Z7982 Long term (current) use of aspirin: Secondary | ICD-10-CM | POA: Insufficient documentation

## 2014-07-18 DIAGNOSIS — Z882 Allergy status to sulfonamides status: Secondary | ICD-10-CM | POA: Insufficient documentation

## 2014-07-18 DIAGNOSIS — Z881 Allergy status to other antibiotic agents status: Secondary | ICD-10-CM | POA: Insufficient documentation

## 2014-07-18 DIAGNOSIS — K219 Gastro-esophageal reflux disease without esophagitis: Secondary | ICD-10-CM | POA: Diagnosis not present

## 2014-07-18 DIAGNOSIS — R51 Headache: Secondary | ICD-10-CM | POA: Insufficient documentation

## 2014-07-18 DIAGNOSIS — R42 Dizziness and giddiness: Secondary | ICD-10-CM | POA: Diagnosis not present

## 2014-07-18 DIAGNOSIS — Z951 Presence of aortocoronary bypass graft: Secondary | ICD-10-CM | POA: Insufficient documentation

## 2014-07-18 DIAGNOSIS — Z8249 Family history of ischemic heart disease and other diseases of the circulatory system: Secondary | ICD-10-CM | POA: Diagnosis not present

## 2014-07-18 DIAGNOSIS — E669 Obesity, unspecified: Secondary | ICD-10-CM | POA: Insufficient documentation

## 2014-07-18 DIAGNOSIS — I252 Old myocardial infarction: Secondary | ICD-10-CM | POA: Insufficient documentation

## 2014-07-18 DIAGNOSIS — R079 Chest pain, unspecified: Secondary | ICD-10-CM | POA: Diagnosis present

## 2014-07-18 DIAGNOSIS — I2510 Atherosclerotic heart disease of native coronary artery without angina pectoris: Secondary | ICD-10-CM | POA: Insufficient documentation

## 2014-07-18 DIAGNOSIS — I160 Hypertensive urgency: Secondary | ICD-10-CM | POA: Diagnosis present

## 2014-07-18 LAB — CBC WITH DIFFERENTIAL/PLATELET
BASOS PCT: 0 %
Basophils Absolute: 0.1 10*3/uL (ref 0–0.1)
EOS ABS: 0 10*3/uL (ref 0–0.7)
Eosinophils Relative: 0 %
HCT: 49.8 % (ref 40.0–52.0)
HEMOGLOBIN: 16.7 g/dL (ref 13.0–18.0)
LYMPHS ABS: 2.4 10*3/uL (ref 1.0–3.6)
LYMPHS PCT: 17 %
MCH: 31.4 pg (ref 26.0–34.0)
MCHC: 33.5 g/dL (ref 32.0–36.0)
MCV: 93.9 fL (ref 80.0–100.0)
MONOS PCT: 9 %
Monocytes Absolute: 1.3 10*3/uL — ABNORMAL HIGH (ref 0.2–1.0)
NEUTROS PCT: 74 %
Neutro Abs: 10.8 10*3/uL — ABNORMAL HIGH (ref 1.4–6.5)
PLATELETS: 166 10*3/uL (ref 150–440)
RBC: 5.31 MIL/uL (ref 4.40–5.90)
RDW: 13.3 % (ref 11.5–14.5)
WBC: 14.5 10*3/uL — AB (ref 3.8–10.6)

## 2014-07-18 LAB — BASIC METABOLIC PANEL
ANION GAP: 11 (ref 5–15)
BUN: 22 mg/dL — ABNORMAL HIGH (ref 6–20)
CALCIUM: 9 mg/dL (ref 8.9–10.3)
CO2: 28 mmol/L (ref 22–32)
Chloride: 103 mmol/L (ref 101–111)
Creatinine, Ser: 1.22 mg/dL (ref 0.61–1.24)
GFR calc Af Amer: >60 mL/min (ref >60)
GFR calc non Af Amer: >60 mL/min (ref >60)
Glucose, Bld: 96 mg/dL (ref 65–99)
POTASSIUM: 3.6 mmol/L (ref 3.5–5.1)
SODIUM: 142 mmol/L (ref 135–145)

## 2014-07-18 LAB — TROPONIN I
TROPONIN I: 0.03 ng/mL (ref <0.031)
Troponin I: 0.03 ng/mL (ref <0.031)

## 2014-07-18 MED ORDER — ASPIRIN 81 MG PO CHEW
324.0000 mg | CHEWABLE_TABLET | Freq: Once | ORAL | Status: AC
  Administered 2014-07-18: 324 mg via ORAL
  Filled 2014-07-18: qty 4

## 2014-07-18 NOTE — ED Provider Notes (Signed)
[facility] Emergency Department Provider Note  ____________________________________________  Time seen: Approximately 8:20 PM  I have reviewed the triage vital signs and the nursing notes.   HISTORY  Chief Complaint Hypertension and Dizziness    HPI Tandre Conly is a 59 y.o. male with a history of hypertension and cardiac bypass presents today with "I hit his feeling hot." He said that he was out working in the yard today got the sensation that his head was feeling hot. He said he took several rest but the feeling did not resolve. He said that he has felt this way previously when he has had elevated blood pressure. He then went to the supermarket to check his blood pressure and found to be in the 220s over 80s. He reports he has been compliant with his blood pressure medications. Despite the triage note saying lightheaded and dizziness the patient denies both. He says that he only feels this hot sensation in his head. He denies any chest pain or shortness of breath but does say that he is having indigestion with a belching sensation. He did not have classic chest pain when he had his heart attack. He was having other vague abdominal symptoms at the time. Denying any chest pain or shortness of breath at this time.   Past Medical History  Diagnosis Date  . Coronary artery disease   . Hypertension   . Sleep apnea   . Anginal pain   . Myocardial infarction   . GERD (gastroesophageal reflux disease)   . Smoker   . Hypercholesterolemia   . Left main coronary artery disease 06/05/2012  . Essential hypertension, benign 06/05/2012  . Tobacco abuse 06/05/2012  . Hyperlipidemia 06/05/2012  . Obesity (BMI 30-39.9) 06/05/2012  . Obstructive sleep apnea 06/05/2012    Patient Active Problem List   Diagnosis Date Noted  . Left main coronary artery disease 06/05/2012  . Coronary artery disease 06/05/2012  . Essential hypertension, benign 06/05/2012  . Tobacco abuse 06/05/2012   . Hyperlipidemia 06/05/2012  . Obesity (BMI 30-39.9) 06/05/2012  . Obstructive sleep apnea 06/05/2012  . GERD (gastroesophageal reflux disease) 06/05/2012    Past Surgical History  Procedure Laterality Date  . Cardiac catheterization    . Tonsillectomy    . Coronary artery bypass graft N/A 06/06/2012    Procedure: CORONARY ARTERY BYPASS GRAFTING (CABG);  Surgeon: Grace Isaac, MD;  Location: [place];  Service: Open Heart Surgery;  Laterality: N/A;  x4 using right greater saphenous vein and left internal mammary.   . Intraoperative transesophageal echocardiogram N/A 06/06/2012    Procedure: INTRAOPERATIVE TRANSESOPHAGEAL ECHOCARDIOGRAM;  Surgeon: Grace Isaac, MD;  Location: [place];  Service: Open Heart Surgery;  Laterality: N/A;  . Endovein harvest of greater saphenous vein Right 06/06/2012    Procedure: ENDOVEIN HARVEST OF GREATER SAPHENOUS VEIN;  Surgeon: Grace Isaac, MD;  Location: [place];  Service: Open Heart Surgery;  Laterality: Right;    Current Outpatient Rx  Name  Route  Sig  Dispense  Refill  . aspirin EC 325 MG EC tablet   Oral   Take 1 tablet (325 mg total) by mouth daily.         . cetirizine (ZYRTEC) 10 MG tablet   Oral   Take 10 mg by mouth every evening.         . cholecalciferol (VITAMIN D) 1000 UNITS tablet   Oral   Take 2,000 Units by mouth every evening.         Marland Kitchen  metoprolol tartrate (LOPRESSOR) 25 MG tablet   Oral   Take 1 tablet (25 mg total) by mouth 2 (two) times daily.   60 tablet   1   . rosuvastatin (CRESTOR) 20 MG tablet   Oral   Take 20 mg by mouth every evening.          . traMADol (ULTRAM) 50 MG tablet   Oral   Take 1 tablet (50 mg total) by mouth every 6 (six) hours as needed for pain.   40 tablet   0     Allergies Sulfa antibiotics  History reviewed. No pertinent family history.  Social History History  Substance Use Topics  . Smoking status: Former Smoker -- 1.50 packs/day for 37 years    Types: Cigarettes     Quit date: 06/05/2012  . Smokeless tobacco: Never Used  . Alcohol Use: No    Review of Systems Constitutional: No fever/chills Eyes: No visual changes. ENT: No sore throat. Cardiovascular: Denies chest pain. Respiratory: Denies shortness of breath. Gastrointestinal: No abdominal pain.  No nausea, no vomiting.  No diarrhea.  No constipation. Genitourinary: Negative for dysuria. Musculoskeletal: Negative for back pain. Skin: Negative for rash. Neurological: Negative for headaches, focal weakness or numbness.  10-point ROS otherwise negative.  ____________________________________________   PHYSICAL EXAM:  VITAL SIGNS: ED Triage Vitals  Enc Vitals Group     BP 07/18/14 1908 206/109 mmHg     Pulse Rate 07/18/14 1908 46     Resp 07/18/14 1908 18     Temp 07/18/14 1908 99.8 F (37.7 C)     Temp Source 07/18/14 1908 Oral     SpO2 07/18/14 1908 97 %     Weight --      Height --      Head Cir --      Peak Flow --      Pain Score 07/18/14 1910 0     Pain Loc --      Pain Edu? --      Excl. in [place]? --     Constitutional: Alert and oriented. Well appearing and in no acute distress. Eyes: Conjunctivae are normal. PERRL. EOMI. Head: Atraumatic. Nose: No congestion/rhinnorhea. Mouth/Throat: Mucous membranes are moist.  Oropharynx non-erythematous. Neck: No stridor.   Cardiovascular: Normal rate, regular rhythm. Grossly normal heart sounds.  Good peripheral circulation. Respiratory: Normal respiratory effort.  No retractions. Lungs CTAB. Gastrointestinal: Soft and nontender. No distention. No abdominal bruits. No CVA tenderness. Musculoskeletal: No lower extremity tenderness nor edema.  No joint effusions. Neurologic:  Normal speech and language. No gross focal neurologic deficits are appreciated. No gait instability. Skin:  Skin is warm, dry and intact. No rash noted. Psychiatric: Mood and affect are normal. Speech and behavior are  normal.  ____________________________________________   LABS (all labs ordered are listed, but only abnormal results are displayed)  Labs Reviewed  CBC WITH DIFFERENTIAL/PLATELET - Abnormal; Notable for the following:    WBC 14.5 (*)    Neutro Abs 10.8 (*)    Monocytes Absolute 1.3 (*)    All other components within normal limits  BASIC METABOLIC PANEL - Abnormal; Notable for the following:    BUN 22 (*)    All other components within normal limits  TROPONIN I  TROPONIN I   ____________________________________________  EKG  ED ECG REPORT I, Doran Stabler, the attending physician, personally viewed and interpreted this ECG.   Date: 07/18/2014  EKG Time: 2123  Rate: 80  Rhythm: normal  sinus rhythm with PVCs every fourth beat  Axis: Normal axis  Intervals:none  ST&T Change: T wave inversions in 1, aVL with 1 mm depression in these leads. Otherwise no ST elevations or depressions or T-wave inversions.  Inversions seen on previous EKG from June 2014, however patient appeared to be having an acute MI at this time.  ____________________________________________  RADIOLOGY  No acute disease on the chest x-ray. I personally reviewed these films. ____________________________________________   PROCEDURES   ____________________________________________   INITIAL IMPRESSION / ASSESSMENT AND PLAN / ED COURSE  Pertinent labs & imaging results that were available during my care of the patient were reviewed by me and considered in my medical decision making (see chart for details).  ----------------------------------------- 11:33 PM on 07/18/2014 -----------------------------------------  Temperature retaken and is 98.6. Patient resting. At this time without any chest pain or abdominal symptoms. Patient with concerning story with vague abdominal symptoms which are similar to when he was diagnosed with his triple vessel disease. We'll admit to the hospital for chest pain  rule out. Signed out to Dr. Lavetta Nielsen. Extend the patient as well as his family. Given dose of aspirin. ____________________________________________   FINAL CLINICAL IMPRESSION(S) / ED DIAGNOSES  Acute uncontrolled hypertension with chest pain. Initial visit.    Orbie Pyo, MD 07/18/14 [phone]

## 2014-07-18 NOTE — ED Notes (Signed)
Pt presents to ED with c/o lightheadedness/dizziness (states his hit feels hot), and hypertension BP-228/84. Pt states he is taking his BP med as prescribes. Denies headache or other neuro symptoms.

## 2014-07-19 ENCOUNTER — Encounter: Payer: Self-pay | Admitting: Internal Medicine

## 2014-07-19 DIAGNOSIS — I160 Hypertensive urgency: Secondary | ICD-10-CM | POA: Diagnosis present

## 2014-07-19 LAB — TROPONIN I
Troponin I: 0.03 ng/mL (ref <0.031)
Troponin I: 0.03 ng/mL (ref <0.031)

## 2014-07-19 MED ORDER — HYDRALAZINE HCL 20 MG/ML IJ SOLN
10.0000 mg | INTRAMUSCULAR | Status: DC | PRN
  Administered 2014-07-19 (×2): 10 mg via INTRAVENOUS
  Filled 2014-07-19 (×2): qty 1

## 2014-07-19 MED ORDER — HYDRALAZINE HCL 25 MG PO TABS
25.0000 mg | ORAL_TABLET | Freq: Three times a day (TID) | ORAL | Status: DC
  Administered 2014-07-19 – 2014-07-20 (×3): 25 mg via ORAL
  Filled 2014-07-19 (×3): qty 1

## 2014-07-19 MED ORDER — ASPIRIN EC 325 MG PO TBEC
325.0000 mg | DELAYED_RELEASE_TABLET | Freq: Every day | ORAL | Status: DC
  Administered 2014-07-19 – 2014-07-20 (×2): 325 mg via ORAL
  Filled 2014-07-19 (×2): qty 1

## 2014-07-19 MED ORDER — ONDANSETRON HCL 4 MG PO TABS: 4.0000 mg | ORAL_TABLET | Freq: Four times a day (QID) | ORAL | Status: DC | PRN

## 2014-07-19 MED ORDER — HEPARIN SODIUM (PORCINE) 5000 UNIT/ML IJ SOLN
5000.0000 [IU] | Freq: Three times a day (TID) | INTRAMUSCULAR | Status: DC
  Administered 2014-07-19 – 2014-07-20 (×5): 5000 [IU] via SUBCUTANEOUS
  Filled 2014-07-19 (×5): qty 1

## 2014-07-19 MED ORDER — METOPROLOL TARTRATE 25 MG PO TABS
25.0000 mg | ORAL_TABLET | Freq: Two times a day (BID) | ORAL | Status: DC
Start: 2014-07-19 — End: 2014-07-20
  Administered 2014-07-19 – 2014-07-20 (×4): 25 mg via ORAL
  Filled 2014-07-19 (×4): qty 1

## 2014-07-19 MED ORDER — MORPHINE SULFATE 2 MG/ML IJ SOLN: 2.0000 mg | INTRAMUSCULAR | Status: DC | PRN

## 2014-07-19 MED ORDER — VITAMIN D 1000 UNITS PO TABS
2000.0000 [IU] | ORAL_TABLET | Freq: Every evening | ORAL | Status: DC
  Administered 2014-07-19: 2000 [IU] via ORAL
  Filled 2014-07-19: qty 2

## 2014-07-19 MED ORDER — ACETAMINOPHEN 325 MG PO TABS: 650.0000 mg | ORAL_TABLET | Freq: Four times a day (QID) | ORAL | Status: DC | PRN

## 2014-07-19 MED ORDER — TRAMADOL HCL 50 MG PO TABS: 50.0000 mg | ORAL_TABLET | Freq: Four times a day (QID) | ORAL | Status: DC | PRN

## 2014-07-19 MED ORDER — SODIUM CHLORIDE 0.9 % IJ SOLN
3.0000 mL | Freq: Two times a day (BID) | INTRAMUSCULAR | Status: DC
  Administered 2014-07-19 – 2014-07-20 (×4): 3 mL via INTRAVENOUS

## 2014-07-19 MED ORDER — LORATADINE 10 MG PO TABS
10.0000 mg | ORAL_TABLET | Freq: Every day | ORAL | Status: DC
  Administered 2014-07-19 – 2014-07-20 (×2): 10 mg via ORAL
  Filled 2014-07-19 (×2): qty 1

## 2014-07-19 MED ORDER — ACETAMINOPHEN 650 MG RE SUPP: 650.0000 mg | Freq: Four times a day (QID) | RECTAL | Status: DC | PRN

## 2014-07-19 MED ORDER — ONDANSETRON HCL 4 MG/2ML IJ SOLN: 4.0000 mg | Freq: Four times a day (QID) | INTRAMUSCULAR | Status: DC | PRN

## 2014-07-19 MED ORDER — ROSUVASTATIN CALCIUM 20 MG PO TABS
20.0000 mg | ORAL_TABLET | Freq: Every evening | ORAL | Status: DC
  Administered 2014-07-19: 20 mg via ORAL
  Filled 2014-07-19: qty 1

## 2014-07-19 NOTE — H&P (Signed)
[place] at [place] NAME: [name]    MR#:  [id number]  DATE OF BIRTH:  [birth year]   DATE OF ADMISSION:  07/18/2014  PRIMARY CARE PHYSICIAN: [name], MD   REQUESTING/REFERRING PHYSICIAN: Clearnce Hasten  CHIEF COMPLAINT:   Chief Complaint  Patient presents with  . Hypertension  . Dizziness    HISTORY OF PRESENT ILLNESS:  [name]  is a 59 y.o. male with a known history of coronary artery disease status post CABG presenting with headache. Patient describes working outside today felt warm/flushed with associated headache, numbness entire head throbbing in quality 5-6/10 intensity no worsening or relieving factorsassociated vision changes. States that he occasionally gets the symptoms as well as blood pressure is elevated thus essentially checked his blood pressure noted to be 228/84 at that time presented to Hospital further workup and evaluation. Despite above symptoms denies any chest pain, shortness breath further symptomatology  PAST MEDICAL HISTORY:   Past Medical History  Diagnosis Date  . Coronary artery disease   . Hypertension   . Sleep apnea   . Anginal pain   . Myocardial infarction   . GERD (gastroesophageal reflux disease)   . Smoker   . Hypercholesterolemia   . Left main coronary artery disease 06/05/2012  . Essential hypertension, benign 06/05/2012  . Tobacco abuse 06/05/2012  . Hyperlipidemia 06/05/2012  . Obesity (BMI 30-39.9) 06/05/2012  . Obstructive sleep apnea 06/05/2012    PAST SURGICAL HISTORY:   Past Surgical History  Procedure Laterality Date  . Cardiac catheterization    . Tonsillectomy    . Coronary artery bypass graft N/A 06/06/2012    Procedure: CORONARY ARTERY BYPASS GRAFTING (CABG);  Surgeon: Grace Isaac, MD;  Location: [place];  Service: Open Heart Surgery;  Laterality: N/A;  x4 using right greater saphenous vein and left internal mammary.   . Intraoperative transesophageal  echocardiogram N/A 06/06/2012    Procedure: INTRAOPERATIVE TRANSESOPHAGEAL ECHOCARDIOGRAM;  Surgeon: Grace Isaac, MD;  Location: [place];  Service: Open Heart Surgery;  Laterality: N/A;  . Endovein harvest of greater saphenous vein Right 06/06/2012    Procedure: ENDOVEIN HARVEST OF GREATER SAPHENOUS VEIN;  Surgeon: Grace Isaac, MD;  Location: [place];  Service: Open Heart Surgery;  Laterality: Right;    SOCIAL HISTORY:   History  Substance Use Topics  . Smoking status: Former Smoker -- 1.50 packs/day for 37 years    Types: Cigarettes    Quit date: 06/05/2012  . Smokeless tobacco: Never Used  . Alcohol Use: No    FAMILY HISTORY:   Family History  Problem Relation Age of Onset  . Hypertension Other     DRUG ALLERGIES:   Allergies  Allergen Reactions  . Sulfa Antibiotics     "Makes him feel worse then better when taken"per wife    REVIEW OF SYSTEMS:  REVIEW OF SYSTEMS:  CONSTITUTIONAL: Denies fevers, chills, fatigue, weakness.  EYES: Denies blurred vision, double vision, or eye pain.  EARS, NOSE, THROAT: Denies tinnitus, ear pain, hearing loss.  RESPIRATORY: denies cough, shortness of breath, wheezing  CARDIOVASCULAR: Denies chest pain, palpitations, edema.  GASTROINTESTINAL: Denies nausea, vomiting, diarrhea, abdominal pain.  GENITOURINARY: Denies dysuria, hematuria.  ENDOCRINE: Denies nocturia or thyroid problems. HEMATOLOGIC AND LYMPHATIC: Denies easy bruising or bleeding.  SKIN: Denies rash or lesions.  MUSCULOSKELETAL: Denies pain in neck, back, shoulder, knees, hips, or further arthritic symptoms.  NEUROLOGIC: Denies paralysis, paresthesias. Positive headache PSYCHIATRIC: Denies anxiety  or depressive symptoms. Otherwise full review of systems performed by me is negative.   MEDICATIONS AT HOME:   Prior to Admission medications   Medication Sig Start Date End Date Taking? Authorizing Provider  aspirin EC 325 MG EC tablet Take 1 tablet (325 mg total) by mouth  daily. 06/10/12   Wayne E Gold, PA-C  cetirizine (ZYRTEC) 10 MG tablet Take 10 mg by mouth every evening.    Historical Provider, MD  cholecalciferol (VITAMIN D) 1000 UNITS tablet Take 2,000 Units by mouth every evening.    Historical Provider, MD  metoprolol tartrate (LOPRESSOR) 25 MG tablet Take 1 tablet (25 mg total) by mouth 2 (two) times daily. 06/10/12   Wayne E Gold, PA-C  rosuvastatin (CRESTOR) 20 MG tablet Take 20 mg by mouth every evening.     Historical Provider, MD  traMADol (ULTRAM) 50 MG tablet Take 1 tablet (50 mg total) by mouth every 6 (six) hours as needed for pain. 09/05/12   Grace Isaac, MD      VITAL SIGNS:  Blood pressure 167/90, pulse 71, temperature 99.8 F (37.7 C), temperature source Oral, resp. rate 18, SpO2 96 %.  PHYSICAL EXAMINATION:  VITAL SIGNS: Filed Vitals:   07/18/14 2246  BP: 167/90  Pulse: 71  Temp:   Resp: 18   GENERAL:58 y.o.male currently in no acute distress.  HEAD: Normocephalic, atraumatic.  EYES: Pupils equal, round, reactive to light. Extraocular muscles intact. No scleral icterus.  MOUTH: Moist mucosal membrane. Dentition intact. No abscess noted.  EAR, NOSE, THROAT: Clear without exudates. No external lesions.  NECK: Supple. No thyromegaly. No nodules. No JVD.  PULMONARY: Clear to ascultation, without wheeze rails or rhonci. No use of accessory muscles, Good respiratory effort. good air entry bilaterally CHEST: Nontender to palpation.  CARDIOVASCULAR: S1 and S2. Regular rate and rhythm. No murmurs, rubs, or gallops. No edema. Pedal pulses 2+ bilaterally.  GASTROINTESTINAL: Soft, nontender, nondistended. No masses. Positive bowel sounds. No hepatosplenomegaly.  MUSCULOSKELETAL: No swelling, clubbing, or edema. Range of motion full in all extremities.  NEUROLOGIC: Cranial nerves II through XII are intact. No gross focal neurological deficits. Sensation intact. Reflexes intact.  SKIN: No ulceration, lesions, rashes, or cyanosis. Skin warm  and dry. Turgor intact.  PSYCHIATRIC: Mood, affect within normal limits. The patient is awake, alert and oriented x 3. Insight, judgment intact.    LABORATORY PANEL:   CBC  Recent Labs Lab 07/18/14 2050  WBC 14.5*  HGB 16.7  HCT 49.8  PLT 166   ------------------------------------------------------------------------------------------------------------------  Chemistries   Recent Labs Lab 07/18/14 2050  NA 142  K 3.6  CL 103  CO2 28  GLUCOSE 96  BUN 22*  CREATININE 1.22  CALCIUM 9.0   ------------------------------------------------------------------------------------------------------------------  Cardiac Enzymes  Recent Labs Lab 07/18/14 2245  TROPONINI 0.03   ------------------------------------------------------------------------------------------------------------------  RADIOLOGY:  Dg Chest 2 View  07/18/2014   CLINICAL DATA:  Hypertension. History of coronary artery disease, status post coronary artery bypass grafting.  EXAM: CHEST  2 VIEW  COMPARISON:  July 27, 2012  FINDINGS: Lungs are clear. Heart size and pulmonary vascularity are normal. No adenopathy. Patient is status post coronary artery bypass grafting. No bone lesions.  IMPRESSION: No edema or consolidation.   Electronically Signed   By: Lowella Grip III M.D.   On: 07/18/2014 21:06    EKG:   Orders placed or performed during the hospital encounter of 07/18/14  . ED EKG  . ED EKG    IMPRESSION AND PLAN:  59 year old Caucasian gentleman history of coronary artery disease status post CABG presenting with elevated blood pressure.  1. Hypertensive urgency: His headache has improved blood pressures to remain somewhat elevated, [name] doses home medications as he is yet to receive this evening, add when necessary hydralazine. Trend cardiac enzymes, place on telemetry, likely able to discharge in the morning 2. Coronary artery disease status post CABG: Continue with Crestor, Lopressor,  aspirin 3. Venous thrombi embolism prophylactic: Heparin subcutaneous    All the records are reviewed and case discussed with ED provider. Management plans discussed with the patient, family and they are in agreement.  CODE STATUS: Full  TOTAL TIME TAKING CARE OF THIS PATIENT: 35 minutes.    [place] Dani,  Karenann Cai.D on 07/19/2014 at 12:22 AM  Between 7am to 6pm - Pager - [phone]  After 6pm: House Pager: - [facility]  Office  [phone]  CC: Primary care physician; [name], MD

## 2014-07-19 NOTE — Progress Notes (Signed)
[place] at [place] NAME: [name]    MR#:  [id number]  DATE OF BIRTH:  [birth year]  SUBJECTIVE: Admitted for hypertensive urgency. Had the warm flushed feeling with headache yesterday. Blood pressure check at [employer] showed 228/84. Patient denies chest pain. No shortness of breath. Started on IV hydralazine he takes metoprolol at home.   CHIEF COMPLAINT:   Chief Complaint  Patient presents with  . Hypertension  . Dizziness    REVIEW OF SYSTEMS:    Review of Systems  Constitutional: Negative for fever and chills.  HENT: Negative for hearing loss.   Eyes: Negative for blurred vision, double vision and photophobia.  Respiratory: Negative for cough, hemoptysis and shortness of breath.   Cardiovascular: Negative for palpitations, orthopnea and leg swelling.  Gastrointestinal: Negative for vomiting, abdominal pain and diarrhea.  Genitourinary: Negative for dysuria and urgency.  Musculoskeletal: Negative for myalgias and neck pain.  Skin: Negative for rash.  Neurological: Negative for dizziness, focal weakness, seizures, weakness and headaches.  Psychiatric/Behavioral: Negative for memory loss. The patient does not have insomnia.     Nutrition: Tolerating Diet: Tolerating PT:      DRUG ALLERGIES:   Allergies  Allergen Reactions  . Sulfa Antibiotics     "Makes him feel worse then better when taken"per wife    VITALS:  Blood pressure 160/85, pulse 68, temperature 98 F (36.7 C), temperature source Oral, resp. rate 18, height 5\' 7"  (1.702 m), weight 97.251 kg (214 lb 6.4 oz), SpO2 98 %.  PHYSICAL EXAMINATION:   Physical Exam  GENERAL:  59 y.o.-year-old patient lying in the bed with no acute distress.  EYES: Pupils equal, round, reactive to light and accommodation. No scleral icterus. Extraocular muscles intact.  HEENT: Head atraumatic, normocephalic. Oropharynx and nasopharynx clear.  NECK:  Supple, no  jugular venous distention. No thyroid enlargement, no tenderness.  LUNGS: Normal breath sounds bilaterally, no wheezing, rales,rhonchi or crepitation. No use of accessory muscles of respiration.  CARDIOVASCULAR: S1, S2 normal. No murmurs, rubs, or gallops.  ABDOMEN: Soft, nontender, nondistended. Bowel sounds present. No organomegaly or mass.  EXTREMITIES: No pedal edema, cyanosis, or clubbing.  NEUROLOGIC: Cranial nerves II through XII are intact. Muscle strength 5/5 in all extremities. Sensation intact. Gait not checked.  PSYCHIATRIC: The patient is alert and oriented x 3.  SKIN: No obvious rash, lesion, or ulcer.    LABORATORY PANEL:   CBC  Recent Labs Lab 07/18/14 2050  WBC 14.5*  HGB 16.7  HCT 49.8  PLT 166   ------------------------------------------------------------------------------------------------------------------  Chemistries   Recent Labs Lab 07/18/14 2050  NA 142  K 3.6  CL 103  CO2 28  GLUCOSE 96  BUN 22*  CREATININE 1.22  CALCIUM 9.0   ------------------------------------------------------------------------------------------------------------------  Cardiac Enzymes  Recent Labs Lab 07/19/14 0749  TROPONINI 0.03   ------------------------------------------------------------------------------------------------------------------  RADIOLOGY:  Dg Chest 2 View  07/18/2014   CLINICAL DATA:  Hypertension. History of coronary artery disease, status post coronary artery bypass grafting.  EXAM: CHEST  2 VIEW  COMPARISON:  July 27, 2012  FINDINGS: Lungs are clear. Heart size and pulmonary vascularity are normal. No adenopathy. Patient is status post coronary artery bypass grafting. No bone lesions.  IMPRESSION: No edema or consolidation.   Electronically Signed   By: Lowella Grip III M.D.   On: 07/18/2014 21:06     ASSESSMENT AND PLAN:   Principal Problem:   Hypertensive urgency   1. Hypertensive urgency: Continue  metoprolol, 25 twice a day  added hydralazine. Continue every 8 hours.moniot on tele 2.h/o CAd s/p CABG;stable Possibl;e d/c am     All the records are reviewed and case discussed with Care Management/Social Workerr. Management plans discussed with the patient, family and they are in agreement.  CODE STATUS: full  TOTAL TIME TAKING CARE OF THIS PATIENT: 35  minutes.   POSSIBLE D/C IN 1-2DAYS, DEPENDING ON CLINICAL CONDITION.   Epifanio Lesches M.D on 07/19/2014 at 10:24 AM  Between 7am to 6pm - Pager - [phone]  After 6pm go to [email or web address] - password [email or web address] [facility]  Office  [phone]  CC: Primary care physician; [name], MD

## 2014-07-19 NOTE — Care Management (Signed)
Patient has required one dose of IV hydralazine during this shift.  Do not anticipate any discharge needs.

## 2014-07-19 NOTE — Progress Notes (Signed)
A&O. Independent. Admitted with high blood pressure. BP still high. Receiving IV hydralazine PRN. Yasmin confirmed skin. Resting quietly in bed. No complaints.

## 2014-07-20 MED ORDER — HYDRALAZINE HCL 25 MG PO TABS
37.5000 mg | ORAL_TABLET | Freq: Three times a day (TID) | ORAL | Status: DC
  Administered 2014-07-20: 37.5 mg via ORAL
  Filled 2014-07-20: qty 2

## 2014-07-20 MED ORDER — ACETAMINOPHEN 325 MG PO TABS: 650.0000 mg | ORAL_TABLET | Freq: Four times a day (QID) | ORAL | Status: DC | PRN

## 2014-07-20 MED ORDER — HYDRALAZINE HCL 25 MG PO TABS: 37.5000 mg | ORAL_TABLET | Freq: Three times a day (TID) | ORAL | Status: DC

## 2014-07-20 NOTE — Progress Notes (Signed)
Pt prepared for discharged. IV and tele removed. Discharged instructions/pt. Education/f/u appointments reviewed with patient. Understanding voiced.

## 2014-07-20 NOTE — Discharge Instructions (Signed)
Monitor blood pressure twice a day and maintain blood pressure log Follow-up with primary care physician in 3-5 days Follow-up with cardiology Dr. Ubaldo Glassing in a week  activity as tolerated Diet healthy heart

## 2014-07-20 NOTE — Progress Notes (Signed)
Notified provider of repeat blood pressure. Per provider pt is stable for discharge. Will prepare patient for discharge.

## 2014-07-20 NOTE — Progress Notes (Signed)
Spoke w/ Dr. Margaretmary Eddy. Informed of patient's blood pressure. Will recheck as indicated.

## 2014-07-20 NOTE — Discharge Summary (Signed)
[facility] [place] NAME: [name]    MR#:  [id number]  DATE OF BIRTH:  [birth year]  DATE OF ADMISSION:  07/18/2014 ADMITTING PHYSICIAN: Lytle Butte, MD  DATE OF DISCHARGE: 07/20/2014 PRIMARY CARE PHYSICIAN: [name], MD  Resting comfortably with no chest pain or shortness of breath. Denies any headache or dizziness. Denies any blurry vision  ADMISSION DIAGNOSIS:  Uncontrolled hypertension [I10] Chest pain, unspecified chest pain type [R07.9]  DISCHARGE DIAGNOSIS:  Principal Problem:   Hypertensive urgency   SECONDARY DIAGNOSIS:   Past Medical History  Diagnosis Date  . Coronary artery disease   . Hypertension   . Sleep apnea   . Anginal pain   . Myocardial infarction   . GERD (gastroesophageal reflux disease)   . Smoker   . Hypercholesterolemia   . Left main coronary artery disease 06/05/2012  . Essential hypertension, benign 06/05/2012  . Tobacco abuse 06/05/2012  . Hyperlipidemia 06/05/2012  . Obesity (BMI 30-39.9) 06/05/2012  . Obstructive sleep apnea 06/05/2012    HOSPITAL COURSE:   cc Hypertensive urgency   1. Hypertensive urgency: Continue metoprolol, 25 twice a day, added hydralazine, dose increased to 37.5 mg by mouth. Continue every 8 hours. Recommended to check blood pressure twice a day and maintain blood pressure log Follow-up with primary care physician in 3-5 days  2.h/o CAd s/p CABG;stable Outpatient follow-up with cardiology in a week  Anticipate discharge today   DISCHARGE CONDITIONS:   Satisfactory  CONSULTS OBTAINED:    None  PROCEDURES none  DRUG ALLERGIES:   Allergies  Allergen Reactions  . Sulfa Antibiotics     "Makes him feel worse then better when taken"per wife    DISCHARGE MEDICATIONS:   Current Discharge Medication List    START taking these medications   Details  acetaminophen (TYLENOL) 325 MG tablet Take 2 tablets (650 mg total) by mouth every 6 (six)  hours as needed for mild pain (or Fever >/= 101).    hydrALAZINE (APRESOLINE) 25 MG tablet Take 1.5 tablets (37.5 mg total) by mouth every 8 (eight) hours. Qty: 90 tablet, Refills: 0      CONTINUE these medications which have NOT CHANGED   Details  aspirin EC 325 MG EC tablet Take 1 tablet (325 mg total) by mouth daily.    cetirizine (ZYRTEC) 10 MG tablet Take 10 mg by mouth every evening.    cholecalciferol (VITAMIN D) 1000 UNITS tablet Take 2,000 Units by mouth every evening.    metoprolol tartrate (LOPRESSOR) 25 MG tablet Take 1 tablet (25 mg total) by mouth 2 (two) times daily. Qty: 60 tablet, Refills: 1    rosuvastatin (CRESTOR) 20 MG tablet Take 20 mg by mouth every evening.     traMADol (ULTRAM) 50 MG tablet Take 1 tablet (50 mg total) by mouth every 6 (six) hours as needed for pain. Qty: 40 tablet, Refills: 0   Associated Diagnoses: Post-op pain         DISCHARGE INSTRUCTIONS:   Follow-up with primary care physician in 3 days Follow-up with cardiology in a week  DIET:  Cardiac diet  DISCHARGE CONDITION:  Stable  ACTIVITY:  Activity as tolerated  OXYGEN:  Home Oxygen: No.   Oxygen Delivery: room air  DISCHARGE LOCATION:  home   If you experience worsening of your admission symptoms, develop shortness of breath, life threatening emergency, suicidal or homicidal thoughts you must seek medical attention immediately by calling 911 or calling  your MD immediately  if symptoms less severe.  You Must read complete instructions/literature along with all the possible adverse reactions/side effects for all the Medicines you take and that have been prescribed to you. Take any new Medicines after you have completely understood and accpet all the possible adverse reactions/side effects.   Please note  You were cared for by a hospitalist during your hospital stay. If you have any questions about your discharge medications or the care you received while you were in the  hospital after you are discharged, you can call the unit and asked to speak with the hospitalist on call if the hospitalist that took care of you is not available. Once you are discharged, your primary care physician will handle any further medical issues. Please note that NO REFILLS for any discharge medications will be authorized once you are discharged, as it is imperative that you return to your primary care physician (or establish a relationship with a primary care physician if you do not have one) for your aftercare needs so that they can reassess your need for medications and monitor your lab values.     Today  Chief Complaint  Patient presents with  . Hypertension  . Dizziness   Resting comfortably with no complaints  ROS: None 45 CONSTITUTIONAL: Denies fevers, chills. Denies any fatigue, weakness.  EYES: Denies blurry vision, double vision, eye pain. EARS, NOSE, THROAT: Denies tinnitus, ear pain, hearing loss. RESPIRATORY: Denies cough, wheeze, shortness of breath.  CARDIOVASCULAR: Denies chest pain, palpitations, edema.  GASTROINTESTINAL: Denies nausea, vomiting, diarrhea, abdominal pain. Denies bright red blood per rectum. GENITOURINARY: Denies dysuria, hematuria. ENDOCRINE: Denies nocturia or thyroid problems. HEMATOLOGIC AND LYMPHATIC: Denies easy bruising or bleeding. SKIN: Denies rash or lesion. MUSCULOSKELETAL: Denies pain in neck, back, shoulder, knees, hips or arthritic symptoms.  NEUROLOGIC: Denies paralysis, paresthesias.  PSYCHIATRIC: Denies anxiety or depressive symptoms.   VITAL SIGNS:  Blood pressure 175/105, pulse 62, temperature 98.5 F (36.9 C), temperature source Oral, resp. rate 19, height 5\' 7"  (1.702 m), weight 97.251 kg (214 lb 6.4 oz), SpO2 96 %.  I/O:   Intake/Output Summary (Last 24 hours) at 07/20/14 1144 Last data filed at 07/20/14 0900  Gross per 24 hour  Intake    360 ml  Output   1190 ml  Net   -830 ml    PHYSICAL EXAMINATION:   GENERAL:  59 y.o.-year-old patient lying in the bed with no acute distress.  EYES: Pupils equal, round, reactive to light and accommodation. No scleral icterus. Extraocular muscles intact.  HEENT: Head atraumatic, normocephalic. Oropharynx and nasopharynx clear.  NECK:  Supple, no jugular venous distention. No thyroid enlargement, no tenderness.  LUNGS: Normal breath sounds bilaterally, no wheezing, rales,rhonchi or crepitation. No use of accessory muscles of respiration.  CARDIOVASCULAR: S1, S2 normal. No murmurs, rubs, or gallops.  ABDOMEN: Soft, non-tender, non-distended. Bowel sounds present. No organomegaly or mass.  EXTREMITIES: No pedal edema, cyanosis, or clubbing.  NEUROLOGIC: Cranial nerves II through XII are intact. Muscle strength 5/5 in all extremities. Sensation intact. Gait not checked.  PSYCHIATRIC: The patient is alert and oriented x 3.  SKIN: No obvious rash, lesion, or ulcer.   DATA REVIEW:   CBC  Recent Labs Lab 07/18/14 2050  WBC 14.5*  HGB 16.7  HCT 49.8  PLT 166    Chemistries   Recent Labs Lab 07/18/14 2050  NA 142  K 3.6  CL 103  CO2 28  GLUCOSE 96  BUN 22*  CREATININE 1.22  CALCIUM 9.0    Cardiac Enzymes  Recent Labs Lab 07/19/14 1343  TROPONINI 0.03    Microbiology Results  Results for orders placed or performed during the hospital encounter of 06/05/12  Surgical pcr screen     Status: None   Collection Time: 06/05/12  5:38 PM  Result Value Ref Range Status   MRSA, PCR NEGATIVE NEGATIVE Final   Staphylococcus aureus NEGATIVE NEGATIVE Final    Comment:        The Xpert SA Assay (FDA approved for NASAL specimens in patients over 78 years of age), is one component of a comprehensive surveillance program.  Test performance has been validated by [employer] for patients greater than or equal to 55 year old. It is not intended to diagnose infection nor to guide or monitor treatment.    RADIOLOGY:  Dg Chest 2  View  07/18/2014   CLINICAL DATA:  Hypertension. History of coronary artery disease, status post coronary artery bypass grafting.  EXAM: CHEST  2 VIEW  COMPARISON:  July 27, 2012  FINDINGS: Lungs are clear. Heart size and pulmonary vascularity are normal. No adenopathy. Patient is status post coronary artery bypass grafting. No bone lesions.  IMPRESSION: No edema or consolidation.   Electronically Signed   By: Lowella Grip III M.D.   On: 07/18/2014 21:06    EKG:   Orders placed or performed during the hospital encounter of 07/18/14  . ED EKG  . ED EKG  . EKG 12-Lead  . EKG 12-Lead  . EKG 12-Lead  . EKG 12-Lead      Management plans discussed with the patient, family and they are in agreement.  CODE STATUS:     Code Status Orders        Start     Ordered   07/19/14 0006  Full code   Continuous     07/19/14 0005    Advance Directive Documentation        Most Recent Value   Type of Advance Directive  Living will   Pre-existing out of facility DNR order (yellow form or pink MOST form)     "MOST" Form in Place?        TOTAL TIME TAKING CARE OF THIS PATIENT: 45 minutes.    @[facility] @  on 07/20/2014 at 11:44 AM  Between 7am to 6pm - Pager - [phone]  After 6pm go to [email or web address] - password [email or web address] [facility]  Office  [phone]  CC: Primary care physician; [name], MD

## 2014-07-25 ENCOUNTER — Inpatient Hospital Stay: Payer: Self-pay | Admitting: Family Medicine

## 2014-07-25 DIAGNOSIS — Z87442 Personal history of urinary calculi: Secondary | ICD-10-CM | POA: Insufficient documentation

## 2014-07-25 DIAGNOSIS — E7800 Pure hypercholesterolemia, unspecified: Secondary | ICD-10-CM | POA: Insufficient documentation

## 2014-07-25 DIAGNOSIS — E559 Vitamin D deficiency, unspecified: Secondary | ICD-10-CM | POA: Insufficient documentation

## 2014-07-25 DIAGNOSIS — M542 Cervicalgia: Secondary | ICD-10-CM | POA: Insufficient documentation

## 2014-07-25 DIAGNOSIS — Z8619 Personal history of other infectious and parasitic diseases: Secondary | ICD-10-CM | POA: Insufficient documentation

## 2014-07-25 DIAGNOSIS — R079 Chest pain, unspecified: Secondary | ICD-10-CM | POA: Insufficient documentation

## 2014-07-25 DIAGNOSIS — Z951 Presence of aortocoronary bypass graft: Secondary | ICD-10-CM | POA: Insufficient documentation

## 2014-07-25 DIAGNOSIS — A6000 Herpesviral infection of urogenital system, unspecified: Secondary | ICD-10-CM | POA: Insufficient documentation

## 2014-07-25 DIAGNOSIS — I10 Essential (primary) hypertension: Secondary | ICD-10-CM | POA: Insufficient documentation

## 2014-07-25 DIAGNOSIS — R1013 Epigastric pain: Secondary | ICD-10-CM | POA: Insufficient documentation

## 2014-07-25 DIAGNOSIS — L920 Granuloma annulare: Secondary | ICD-10-CM | POA: Insufficient documentation

## 2014-07-25 DIAGNOSIS — J309 Allergic rhinitis, unspecified: Secondary | ICD-10-CM | POA: Insufficient documentation

## 2014-07-25 DIAGNOSIS — E669 Obesity, unspecified: Secondary | ICD-10-CM | POA: Insufficient documentation

## 2014-07-25 DIAGNOSIS — T7840XA Allergy, unspecified, initial encounter: Secondary | ICD-10-CM | POA: Insufficient documentation

## 2014-07-25 DIAGNOSIS — R739 Hyperglycemia, unspecified: Secondary | ICD-10-CM | POA: Insufficient documentation

## 2014-08-08 ENCOUNTER — Other Ambulatory Visit: Payer: Self-pay

## 2014-08-08 ENCOUNTER — Encounter: Payer: Self-pay | Admitting: Family Medicine

## 2014-08-08 ENCOUNTER — Ambulatory Visit (INDEPENDENT_AMBULATORY_CARE_PROVIDER_SITE_OTHER): Payer: BLUE CROSS/BLUE SHIELD | Admitting: Family Medicine

## 2014-08-08 VITALS — BP 135/80 | HR 60 | Temp 97.8°F | Resp 16 | Wt 218.4 lb

## 2014-08-08 DIAGNOSIS — I10 Essential (primary) hypertension: Secondary | ICD-10-CM | POA: Diagnosis not present

## 2014-08-08 DIAGNOSIS — T148XXA Other injury of unspecified body region, initial encounter: Secondary | ICD-10-CM

## 2014-08-08 DIAGNOSIS — T148 Other injury of unspecified body region: Secondary | ICD-10-CM

## 2014-08-08 NOTE — Progress Notes (Signed)
Patient: [name] Male    DOB: October 20, 1955   59 y.o.   MRN: [id number] Visit Date: 08/08/2014  Today's Provider: Vernie Murders, PA   Chief Complaint  Patient presents with  . Leg Injury    Injured left leg 13 days ago.   Subjective:    HPI [name] is here today with concerns about his left leg. Patient states he was loading a body in the back of a minivan when patient felt a pop between his shin and calf muscle. Patient had extreme pain and could barely put weight on the leg. This happened 13 days ago, and his leg is still swollen and bruised.    Patient Active Problem List   Diagnosis Date Noted  . Allergic rhinitis 07/25/2014  . Allergic state 07/25/2014  . Chest pain 07/25/2014  . Abdominal discomfort, epigastric 07/25/2014  . Essential (primary) hypertension 07/25/2014  . Genital herpes 07/25/2014  . 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 H/O coronary artery bypass surgery 07/25/2014  . Personal history of infectious and parasitic disease 07/25/2014  . Hypercholesteremia 07/25/2014  . Blood glucose elevated 07/25/2014  . BP (high blood pressure) 07/25/2014  . Personal history of urinary calculi 07/25/2014  . Cervical pain 07/25/2014  . Adiposity 07/25/2014  . Hypercholesterolemia without hypertriglyceridemia 07/25/2014  . Avitaminosis D 07/25/2014  . Hypertensive urgency 07/19/2014  . Left main coronary artery disease 06/05/2012  . Coronary artery disease 06/05/2012  . Essential hypertension, benign 06/05/2012  . Tobacco abuse 06/05/2012  . Hyperlipidemia 06/05/2012  . Obesity (BMI 30-39.9) 06/05/2012  . Obstructive sleep apnea 06/05/2012  . GERD (gastroesophageal reflux disease) 06/05/2012   Past Surgical History  Procedure Laterality Date  . Cardiac catheterization    . Tonsillectomy    . Coronary artery bypass graft N/A 06/06/2012    Procedure: CORONARY ARTERY BYPASS GRAFTING (CABG);  Surgeon: Grace Isaac, MD;  Location: [facility];  Service:  Open Heart Surgery;  Laterality: N/A;  x4 using right greater saphenous vein and left internal mammary.   . Intraoperative transesophageal echocardiogram N/A 06/06/2012    Procedure: INTRAOPERATIVE TRANSESOPHAGEAL ECHOCARDIOGRAM;  Surgeon: Grace Isaac, MD;  Location: [facility];  Service: Open Heart Surgery;  Laterality: N/A;  . Endovein harvest of greater saphenous vein Right 06/06/2012    Procedure: ENDOVEIN HARVEST OF GREATER SAPHENOUS VEIN;  Surgeon: Grace Isaac, MD;  Location: [facility];  Service: Open Heart Surgery;  Laterality: Right;  . Lithotripsy  01/11/2008   Family History  Problem Relation Age of Onset  . Hypertension Other    + Allergies  Allergen Reactions  . Sulfa Antibiotics     Other reaction(s): Unknown "Makes him feel worse then better when taken"per wife   Previous Medications   ACETAMINOPHEN (TYLENOL) 325 MG TABLET    Take 2 tablets (650 mg total) by mouth every 6 (six) hours as needed for mild pain (or Fever >/= 101).   ASPIRIN EC 325 MG EC TABLET    Take 1 tablet (325 mg total) by mouth daily.   CETIRIZINE (ZYRTEC) 10 MG TABLET    Take 10 mg by mouth every evening.   CHOLECALCIFEROL (VITAMIN D) 1000 UNITS TABLET    Take 2,000 Units by mouth every evening.   HYDRALAZINE (APRESOLINE) 25 MG TABLET    Take 1.5 tablets (37.5 mg total) by mouth every 8 (eight) hours.   LOSARTAN-HYDROCHLOROTHIAZIDE (HYZAAR) 100-12.5 MG PER TABLET       METOPROLOL SUCCINATE (TOPROL-XL) 50  MG 24 HR TABLET    TAKE 1 TABLET (50 MG TOTAL) BY MOUTH ONCE DAILY.   OMEPRAZOLE (PRILOSEC) 40 MG CAPSULE    TAKE ONE PILL ONCE DAILY, 30 MINUTES PRIOR TO EVENING MEAL.   ROSUVASTATIN (CRESTOR) 20 MG TABLET    Take 20 mg by mouth every evening.     Review of Systems  Constitutional: Negative.   HENT: Negative.   Eyes: Negative.   Respiratory: Negative.   Cardiovascular: Positive for leg swelling (Left leg).  Gastrointestinal: Negative.   Endocrine: Positive for cold intolerance.  Genitourinary:  Negative.   Musculoskeletal: Negative.        Patient states pulled calve muscle.  Skin: Negative.   Allergic/Immunologic: Negative.   Neurological: Negative.   Hematological: Bruises/bleeds easily (bruise around the ankle due to injury).  Psychiatric/Behavioral: Negative.     History  Substance Use Topics  . Smoking status: Former Smoker -- 1.50 packs/day for 37 years    Types: Cigarettes    Quit date: 06/05/2012  . Smokeless tobacco: Never Used  . Alcohol Use: No   Objective:   BP 135/80 mmHg  Pulse 60  Temp(Src) 97.8 F (36.6 C) (Oral)  Resp 16  Wt 218 lb 6.4 oz (99.066 kg)  Physical Exam  Constitutional: He is oriented to person, place, and time. He appears well-developed and well-nourished. No distress.  HENT:  Head: Normocephalic and atraumatic.  Right Ear: Hearing normal.  Left Ear: Hearing normal.  Nose: Nose normal.  Eyes: Conjunctivae and lids are normal. Right eye exhibits no discharge. Left eye exhibits no discharge. No scleral icterus.  Pulmonary/Chest: Effort normal. No respiratory distress.  Musculoskeletal: Normal range of motion.  Pain to dorsiflex left foot against resistance. Pain occurs in anterior muscles medial side of tibia. Slight indentation felt in area of discomfort. No Achille's tendon defect or tenderness.  Neurological: He is alert and oriented to person, place, and time.  Skin: Skin is intact. No lesion and no rash noted.  Psychiatric: He has a normal mood and affect. His speech is normal and behavior is normal. Thought content normal.      Assessment & Plan:     1. Muscle tear  Onset 13 days ago while lifting a body on a gurney table in to a minivan. Had immediate pain and pop. Still unable to dorsiflex foot fully due to persistent pain. Recommend using walking boot cast and schedule orthopedic referral for suspected anterior tibial muscle tear. - Ambulatory referral to Orthopedic Surgery  2. Essential hypertension, benign Well  controlled with medication changes per cardiologist (Dr. Ubaldo Glassing). Continue Losartan-HCTZ 100-12.5 mg qd, Toprol-SL 50 mg qd and Hydralazine 25 mg 1.5 tablets TID. Follow up with Dr. Ubaldo Glassing as planned.     Vernie Murders, PA  [facility]

## 2014-11-02 ENCOUNTER — Other Ambulatory Visit: Payer: Self-pay | Admitting: Family Medicine

## 2015-04-15 ENCOUNTER — Ambulatory Visit (INDEPENDENT_AMBULATORY_CARE_PROVIDER_SITE_OTHER): Payer: BLUE CROSS/BLUE SHIELD | Admitting: Family Medicine

## 2015-04-15 ENCOUNTER — Other Ambulatory Visit: Payer: Self-pay

## 2015-04-15 ENCOUNTER — Encounter: Payer: Self-pay | Admitting: Family Medicine

## 2015-04-15 VITALS — BP 142/90 | HR 68 | Temp 98.2°F | Resp 14 | Wt 219.0 lb

## 2015-04-15 DIAGNOSIS — K2100 Gastro-esophageal reflux disease with esophagitis, without bleeding: Secondary | ICD-10-CM

## 2015-04-15 DIAGNOSIS — R0789 Other chest pain: Secondary | ICD-10-CM

## 2015-04-15 DIAGNOSIS — I25810 Atherosclerosis of coronary artery bypass graft(s) without angina pectoris: Secondary | ICD-10-CM

## 2015-04-15 MED ORDER — DEXLANSOPRAZOLE 60 MG PO CPDR: 60.0000 mg | DELAYED_RELEASE_CAPSULE | Freq: Every day | ORAL | Status: DC

## 2015-04-15 NOTE — Progress Notes (Signed)
Patient ID: [name], male   DOB: 06/24/1955, 60 y.o.   MRN: [id number]   Patient: [name] Male    DOB: Jan 05, 1955   60 y.o.   MRN: [id number] Visit Date: 04/15/2015  Today's Provider: Vernie Murders, PA   Chief Complaint  Patient presents with  . Gastroesophageal Reflux  . Follow-up   Subjective:    Gastroesophageal Reflux He complains of coughing, heartburn and nausea. This is a chronic problem. The current episode started more than 1 year ago. The problem occurs frequently. The problem has been gradually worsening. The heartburn duration is more than one hour. The heartburn is located in the substernum. The heartburn wakes him from sleep. Exacerbated by: eating dinner late. He has tried an antacid and head elevation for the symptoms. The treatment provided mild relief.   Patient Active Problem List   Diagnosis Date Noted  . Allergic rhinitis 07/25/2014  . Allergic state 07/25/2014  . Chest pain 07/25/2014  . Abdominal discomfort, epigastric 07/25/2014  . Essential (primary) hypertension 07/25/2014  . Genital herpes 07/25/2014  . 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 H/O coronary artery bypass surgery 07/25/2014  . Personal history of infectious and parasitic disease 07/25/2014  . Hypercholesteremia 07/25/2014  . Blood glucose elevated 07/25/2014  . BP (high blood pressure) 07/25/2014  . Personal history of urinary calculi 07/25/2014  . Cervical pain 07/25/2014  . Adiposity 07/25/2014  . Hypercholesterolemia without hypertriglyceridemia 07/25/2014  . Avitaminosis D 07/25/2014  . Hypertensive urgency 07/19/2014  . Left main coronary artery disease 06/05/2012  . Coronary artery disease 06/05/2012  . Essential hypertension, benign 06/05/2012  . Tobacco abuse 06/05/2012  . Hyperlipidemia 06/05/2012  . Obesity (BMI 30-39.9) 06/05/2012  . Obstructive sleep apnea 06/05/2012  . GERD (gastroesophageal reflux disease) 06/05/2012   Past Surgical History  Procedure  Laterality Date  . Cardiac catheterization    . Tonsillectomy    . Coronary artery bypass graft N/A 06/06/2012    Procedure: CORONARY ARTERY BYPASS GRAFTING (CABG);  Surgeon: Grace Isaac, MD;  Location: [facility];  Service: Open Heart Surgery;  Laterality: N/A;  x4 using right greater saphenous vein and left internal mammary.   . Intraoperative transesophageal echocardiogram N/A 06/06/2012    Procedure: INTRAOPERATIVE TRANSESOPHAGEAL ECHOCARDIOGRAM;  Surgeon: Grace Isaac, MD;  Location: [facility];  Service: Open Heart Surgery;  Laterality: N/A;  . Endovein harvest of greater saphenous vein Right 06/06/2012    Procedure: ENDOVEIN HARVEST OF GREATER SAPHENOUS VEIN;  Surgeon: Grace Isaac, MD;  Location: [facility];  Service: Open Heart Surgery;  Laterality: Right;  . Lithotripsy  01/11/2008   Family History  Problem Relation Age of Onset  . Hypertension Other      Previous Medications   ACETAMINOPHEN (TYLENOL) 325 MG TABLET    Take 2 tablets (650 mg total) by mouth every 6 (six) hours as needed for mild pain (or Fever >/= 101).   ASPIRIN EC 325 MG EC TABLET    Take 1 tablet (325 mg total) by mouth daily.   CETIRIZINE (ZYRTEC) 10 MG TABLET    Take 10 mg by mouth every evening.   CHOLECALCIFEROL (VITAMIN D) 1000 UNITS TABLET    Take 2,000 Units by mouth every evening.   CRESTOR 20 MG TABLET    TAKE 1 TABLET BY MOUTH EVERY DAY   FLUTICASONE (FLONASE) 50 MCG/ACT NASAL SPRAY    SPRAY TWICE IN EACH NOSTRIL ONCE DAILY   HYDRALAZINE (APRESOLINE) 25 MG TABLET    Take 1.5  tablets (37.5 mg total) by mouth every 8 (eight) hours.   LOSARTAN-HYDROCHLOROTHIAZIDE (HYZAAR) 100-12.5 MG PER TABLET       METOPROLOL SUCCINATE (TOPROL-XL) 50 MG 24 HR TABLET    TAKE 1 TABLET (50 MG TOTAL) BY MOUTH ONCE DAILY.   OMEPRAZOLE (PRILOSEC) 40 MG CAPSULE    TAKE ONE PILL ONCE DAILY, 30 MINUTES PRIOR TO EVENING MEAL.   Allergies  Allergen Reactions  . Sulfa Antibiotics     Other reaction(s): Unknown "Makes him feel  worse then better when taken"per wife    Review of Systems  Constitutional: Negative.   HENT: Negative.   Eyes: Negative.   Respiratory: Positive for cough.   Cardiovascular: Negative.   Gastrointestinal: Positive for heartburn and nausea.  Endocrine: Negative.   Genitourinary: Negative.   Musculoskeletal: Negative.   Skin: Negative.   Allergic/Immunologic: Negative.   Neurological: Negative.   Hematological: Negative.   Psychiatric/Behavioral: Negative.     Social History  Substance Use Topics  . Smoking status: Former Smoker -- 1.50 packs/day for 37 years    Types: Cigarettes    Quit date: 06/05/2012  . Smokeless tobacco: Never Used  . Alcohol Use: No   Objective:   BP 142/90 mmHg  Pulse 68  Temp(Src) 98.2 F (36.8 C) (Oral)  Resp 14  Wt 219 lb (99.338 kg)  Physical Exam  Constitutional: He is oriented to person, place, and time. He appears well-developed and well-nourished.  HENT:  Head: Normocephalic.  Right Ear: External ear normal.  Left Ear: External ear normal.  Nose: Nose normal.  Mouth/Throat: Oropharynx is clear and moist.  Eyes: Conjunctivae and EOM are normal.  Neck: Neck supple.  Cardiovascular: Normal rate, regular rhythm and normal heart sounds.   Pulmonary/Chest: Effort normal and breath sounds normal.  Abdominal: Soft. Bowel sounds are normal. There is no rebound and no guarding.  Minimal epigastric discomfort. BS normal. No masses or organomegaly.  Lymphadenopathy:    He has no cervical adenopathy.  Neurological: He is alert and oriented to person, place, and time.  Psychiatric: His speech is normal and behavior is normal. Thought content normal. His mood appears anxious. Cognition and memory are normal.      Assessment & Plan:     1. Chest discomfort Pressure and hot gas sensation in chest and epigastric region. EKG essentially unchanged since having CABG 06-06-12 (T wave inversion in lead I and aVL). Recommend getting labs including  Troponin and follow up routinely with cardiologist. - EKG 12-Lead - Comprehensive metabolic panel - Troponin I  2. Gastroesophageal reflux disease with esophagitis Hot gas sensation in chest/epigastric region for the past 2 weeks. Has been on Omeprazole due to laryngeal irritation per Dr. Richardson Landry (ENT). Has tried Maalox and then 40 mg Omeprazole the past 2 days with some help. Pressure this morning has persisted. Will switch Omeprazole to Dexilant for a month and get H.pylori breat test. Denies melena, hematemesis or hematochezia. May need referral to GI for upper endoscopy pending reports and follow up. - CBC with Differential/Platelet - H. pylori breath test - dexlansoprazole (DEXILANT) 60 MG capsule; Take 1 capsule (60 mg total) by mouth daily.  Dispense: 30 capsule; Refill: 0  3. Coronary artery disease involving coronary bypass graft of native heart without angina pectoris Had 4 vessel CABG by Dr. Servando Snare (CVT surgeon in [place]) on 06-06-12. Has not had any significant discomfort until recent pressure "hot gas" sensation. Pending reports, may need earlier follow up with cardiologist.

## 2015-04-15 NOTE — Patient Instructions (Signed)
Food Choices for Gastroesophageal Reflux Disease, Adult When you have gastroesophageal reflux disease (GERD), the foods you eat and your eating habits are very important. Choosing the right foods can help ease the discomfort of GERD. WHAT GENERAL GUIDELINES DO I NEED TO FOLLOW?  Choose fruits, vegetables, whole grains, low-fat dairy products, and low-fat meat, fish, and poultry.  Limit fats such as oils, salad dressings, butter, nuts, and avocado.  Keep a food diary to identify foods that cause symptoms.  Avoid foods that cause reflux. These may be different for different people.  Eat frequent small meals instead of three large meals each day.  Eat your meals slowly, in a relaxed setting.  Limit fried foods.  Cook foods using methods other than frying.  Avoid drinking alcohol.  Avoid drinking large amounts of liquids with your meals.  Avoid bending over or lying down until 2-3 hours after eating. WHAT FOODS ARE NOT RECOMMENDED? The following are some foods and drinks that may worsen your symptoms: Vegetables Tomatoes. Tomato juice. Tomato and spaghetti sauce. Chili peppers. Onion and garlic. Horseradish. Fruits Oranges, grapefruit, and lemon (fruit and juice). Meats High-fat meats, fish, and poultry. This includes hot dogs, ribs, ham, sausage, salami, and bacon. Dairy Whole milk and chocolate milk. Sour cream. Cream. Butter. Ice cream. Cream cheese.  Beverages Coffee and tea, with or without caffeine. Carbonated beverages or energy drinks. Condiments Hot sauce. Barbecue sauce.  Sweets/Desserts Chocolate and cocoa. Donuts. Peppermint and spearmint. Fats and Oils High-fat foods, including French fries and potato chips. Other Vinegar. Strong spices, such as black pepper, white pepper, red pepper, cayenne, curry powder, cloves, ginger, and chili powder. The items listed above may not be a complete list of foods and beverages to avoid. Contact your dietitian for more  information.   This information is not intended to replace advice given to you by your health care provider. Make sure you discuss any questions you have with your health care provider.   Document Released: 12/21/2004 Document Revised: 01/11/2014 Document Reviewed: 10/25/2012 Elsevier Interactive Patient Education 2016 [employer] Inc.  

## 2015-04-16 ENCOUNTER — Telehealth: Payer: Self-pay | Admitting: Family Medicine

## 2015-04-16 NOTE — Telephone Encounter (Signed)
Pt called for lab results.  He had labs yesterday.  Please advise [phone]  Thanks Con Memos

## 2015-04-17 ENCOUNTER — Telehealth: Payer: Self-pay

## 2015-04-17 LAB — CBC WITH DIFFERENTIAL/PLATELET
BASOS ABS: 0 10*3/uL (ref 0.0–0.2)
Basos: 0 %
EOS (ABSOLUTE): 0 10*3/uL (ref 0.0–0.4)
Eos: 0 %
HEMOGLOBIN: 17.4 g/dL (ref 12.6–17.7)
Hematocrit: 49.9 % (ref 37.5–51.0)
Immature Grans (Abs): 0 10*3/uL (ref 0.0–0.1)
Immature Granulocytes: 0 %
LYMPHS ABS: 1.8 10*3/uL (ref 0.7–3.1)
Lymphs: 17 %
MCH: 32.9 pg (ref 26.6–33.0)
MCHC: 34.9 g/dL (ref 31.5–35.7)
MCV: 94 fL (ref 79–97)
MONOCYTES: 4 %
Monocytes Absolute: 0.5 10*3/uL (ref 0.1–0.9)
Neutrophils Absolute: 8.4 10*3/uL — ABNORMAL HIGH (ref 1.4–7.0)
Neutrophils: 79 %
PLATELETS: 216 10*3/uL (ref 150–379)
RBC: 5.29 x10E6/uL (ref 4.14–5.80)
RDW: 13 % (ref 12.3–15.4)
WBC: 10.7 10*3/uL (ref 3.4–10.8)

## 2015-04-17 LAB — COMPREHENSIVE METABOLIC PANEL
ALBUMIN: 4.7 g/dL (ref 3.6–4.8)
ALK PHOS: 67 IU/L (ref 39–117)
ALT: 22 IU/L (ref 0–44)
AST: 27 IU/L (ref 0–40)
Albumin/Globulin Ratio: 1.7 (ref 1.2–2.2)
BUN / CREAT RATIO: 26 — AB (ref 10–24)
BUN: 27 mg/dL (ref 8–27)
Bilirubin Total: 1.5 mg/dL — ABNORMAL HIGH (ref 0.0–1.2)
CO2: 25 mmol/L (ref 18–29)
CREATININE: 1.04 mg/dL (ref 0.76–1.27)
Calcium: 9.7 mg/dL (ref 8.6–10.2)
Chloride: 99 mmol/L (ref 96–106)
GFR calc Af Amer: 90 mL/min/{1.73_m2} (ref >59)
GFR calc non Af Amer: 78 mL/min/{1.73_m2} (ref >59)
GLOBULIN, TOTAL: 2.7 g/dL (ref 1.5–4.5)
GLUCOSE: 100 mg/dL — AB (ref 65–99)
Potassium: 4.3 mmol/L (ref 3.5–5.2)
SODIUM: 145 mmol/L — AB (ref 134–144)
Total Protein: 7.4 g/dL (ref 6.0–8.5)

## 2015-04-17 LAB — TROPONIN I: Troponin I: 0.02 ng/mL (ref 0.00–0.04)

## 2015-04-17 LAB — H. PYLORI BREATH TEST: H. pylori UBiT: NEGATIVE

## 2015-04-17 NOTE — Telephone Encounter (Signed)
-----   Message from Margo Common, [place] sent at 04/17/2015  8:17 AM EDT ----- Blood tests essentially normal except sodium/salt level slightly up. Restrict use of salt. Heart enzyme test and H.pylori test negative. Continue use of Dexilant daily for a month. If discomfort recurs frequently, should follow up with cardiologist and consider seeing gastroenterologist.

## 2015-04-17 NOTE — Telephone Encounter (Signed)
LMTCB ED 

## 2015-04-17 NOTE — Telephone Encounter (Signed)
Advised  ED 

## 2015-04-21 ENCOUNTER — Telehealth: Payer: Self-pay | Admitting: Family Medicine

## 2015-04-21 DIAGNOSIS — K219 Gastro-esophageal reflux disease without esophagitis: Secondary | ICD-10-CM

## 2015-04-21 NOTE — Telephone Encounter (Signed)
Please advise 

## 2015-04-21 NOTE — Telephone Encounter (Signed)
Pt states she acid reflux is a little better.  Pt states last night he was having heat in his throat and having lots of gas.  Pt is asking if he can get a referral to see a specialist to have a upper GI.  [id number]

## 2015-04-21 NOTE — Telephone Encounter (Signed)
Despite use of Dexilant, still having hot sensation in throat and lots of gas. Requests referral to GI for possible upper endoscopy. Will schedule and recommend he continue to use the Dexilant daily.

## 2015-04-22 NOTE — Telephone Encounter (Signed)
Left patient a voicemail advising him that referral is in progress and Judson Roch will call him back with appointment date and time.

## 2015-05-09 ENCOUNTER — Ambulatory Visit (INDEPENDENT_AMBULATORY_CARE_PROVIDER_SITE_OTHER): Payer: BLUE CROSS/BLUE SHIELD | Admitting: Family Medicine

## 2015-05-09 ENCOUNTER — Encounter: Payer: Self-pay | Admitting: Family Medicine

## 2015-05-09 VITALS — BP 100/70 | HR 72 | Temp 98.5°F | Resp 16 | Wt 207.0 lb

## 2015-05-09 DIAGNOSIS — R42 Dizziness and giddiness: Secondary | ICD-10-CM | POA: Diagnosis not present

## 2015-05-09 DIAGNOSIS — R34 Anuria and oliguria: Secondary | ICD-10-CM | POA: Diagnosis not present

## 2015-05-09 DIAGNOSIS — K2100 Gastro-esophageal reflux disease with esophagitis, without bleeding: Secondary | ICD-10-CM

## 2015-05-09 LAB — POCT URINALYSIS DIPSTICK
GLUCOSE UA: NEGATIVE
Leukocytes, UA: NEGATIVE
NITRITE UA: NEGATIVE
PH UA: 6
Protein, UA: 30
RBC UA: NEGATIVE
SPEC GRAV UA: 1.025
UROBILINOGEN UA: 0.2

## 2015-05-09 NOTE — Patient Instructions (Addendum)
Reduce hydralazine to 1 tablet per dose if your blood pressure is <140/90  Start back on omeprazole 20mg  every day in place of Dexilant

## 2015-05-09 NOTE — Progress Notes (Signed)
Patient: [name] Male    DOB: 1955-10-08   60 y.o.   MRN: [id number] Visit Date: 05/09/2015  Today's Provider: Lelon Huh, MD   Chief Complaint  Patient presents with  . Dizziness   Subjective:    Dizziness This is a new problem. The current episode started 1 to 4 weeks ago (10 days). The problem has been unchanged. Associated symptoms include a change in bowel habit, coughing, fatigue, nausea, urinary symptoms, a visual change and weakness. Pertinent negatives include no abdominal pain, arthralgias, chest pain, chills, congestion, fever, headaches, joint swelling, myalgias, neck pain, numbness, rash, sore throat, swollen glands, vertigo or vomiting. Nothing aggravates the symptoms. He has tried nothing for the symptoms.   Patient has had dizziness for 10 days. Patient stated that he did not have any dizziness before he started taking Dexilant 04/15/2015. Since starting medication has had lightheadedness and a dry cough. Has since seen Dr. Ubaldo Glassing and had negative stress test. Has had no other change in medications, but he did reduce hydralazine from TID to BID for a couple days, but couldn't really tell if it made any difference. Has had no spinning sensations. No change in diet and fluid intake. Has been constipated with gray stool since starting Dexilant. He had taken omeprazole intermittent in the past for reflux symptoms, but not on a regular basis. Has had no heartburn since starting Dexilant.   BP Readings from Last 3 Encounters:  05/09/15 100/70  04/15/15 142/90  08/08/14 135/80     Allergies  Allergen Reactions  . Sulfa Antibiotics     Other reaction(s): Unknown "Makes him feel worse then better when taken"per wife   Previous Medications   ACETAMINOPHEN (TYLENOL) 325 MG TABLET    Take 2 tablets (650 mg total) by mouth every 6 (six) hours as needed for mild pain (or Fever >/= 101).   ASPIRIN EC 325 MG EC TABLET    Take 1 tablet (325 mg total) by mouth daily.   CETIRIZINE (ZYRTEC) 10 MG TABLET    Take 10 mg by mouth every evening.   CHOLECALCIFEROL (VITAMIN D) 1000 UNITS TABLET    Take 2,000 Units by mouth every evening.   CRESTOR 20 MG TABLET    TAKE 1 TABLET BY MOUTH EVERY DAY   DEXLANSOPRAZOLE (DEXILANT) 60 MG CAPSULE    Take 1 capsule (60 mg total) by mouth daily.   FLUTICASONE (FLONASE) 50 MCG/ACT NASAL SPRAY    SPRAY TWICE IN EACH NOSTRIL ONCE DAILY   HYDRALAZINE (APRESOLINE) 25 MG TABLET    Take 1.5 tablets (37.5 mg total) by mouth every 8 (eight) hours.   LOSARTAN-HYDROCHLOROTHIAZIDE (HYZAAR) 100-12.5 MG PER TABLET       METOPROLOL SUCCINATE (TOPROL-XL) 50 MG 24 HR TABLET    TAKE 1 TABLET (50 MG TOTAL) BY MOUTH ONCE DAILY.    Review of Systems  Constitutional: Positive for fatigue. Negative for fever, chills and appetite change.  HENT: Negative for congestion and sore throat.   Respiratory: Positive for cough. Negative for chest tightness, shortness of breath and wheezing.   Cardiovascular: Negative for chest pain and palpitations.  Gastrointestinal: Positive for nausea and change in bowel habit. Negative for vomiting and abdominal pain.  Musculoskeletal: Negative for myalgias, joint swelling, arthralgias and neck pain.  Skin: Negative for rash.  Neurological: Positive for dizziness and weakness. Negative for vertigo, numbness and headaches.    Social History  Substance Use Topics  . Smoking status: Former Smoker --  1.50 packs/day for 37 years    Types: Cigarettes    Quit date: 06/05/2012  . Smokeless tobacco: Never Used  . Alcohol Use: No   Objective:   BP 100/70 mmHg  Pulse 72  Temp(Src) 98.5 F (36.9 C) (Oral)  Resp 16  Wt 207 lb (93.895 kg)  SpO2 95%  Physical Exam   General Appearance:    Alert, cooperative, no distress  Eyes:    PERRL, conjunctiva/corneas clear, EOM's intact       Lungs:     Clear to auscultation bilaterally, respirations unlabored  Heart:    Regular rate and rhythm  Neurologic:   Awake, alert,  oriented x 3. No apparent focal neurological           defect. Normal finger to nose. Negative rhomberg. No nystagmus. No sensory deficits.        Results for orders placed or performed in visit on 05/09/15  POCT urinalysis dipstick  Result Value Ref Range   Color, UA Amber    Clarity, UA Cloudy    Glucose, UA Neg    Bilirubin, UA Small    Ketones, UA Trace    Spec Grav, UA 1.025    Blood, UA Neg    pH, UA 6.0    Protein, UA 30    Urobilinogen, UA 0.2    Nitrite, UA Neg    Leukocytes, UA Negative Negative       Assessment & Plan:     1. Decreased urination  - POCT urinalysis dipstick  2. Dizziness He is somewhat hypotensive today, I suspect his BP has been labile and causing symptoms. Unclear if Dexilant has any role in this. Will change to omeprazole as below. Check labs. He has home BP cuff and advised to check before each dose of hydralazine. IF SBP <140, or DBP<90 he is only to take 1 tablet instead of 1.5 tablets until we receive results of labs.  - CBC - Comprehensive metabolic panel  3. Gastroesophageal reflux disease with esophagitis Resolved since starting Dexilant, but unclear if this is contributing to dizziness. He has taken omeprazole in the past and has plenty at home. Will change to omeprazole daily for the time being.        Lelon Huh, MD  [facility]

## 2015-05-10 LAB — CBC
HEMOGLOBIN: 16.6 g/dL (ref 12.6–17.7)
Hematocrit: 47.7 % (ref 37.5–51.0)
MCH: 32.4 pg (ref 26.6–33.0)
MCHC: 34.8 g/dL (ref 31.5–35.7)
MCV: 93 fL (ref 79–97)
Platelets: 221 10*3/uL (ref 150–379)
RBC: 5.12 x10E6/uL (ref 4.14–5.80)
RDW: 13 % (ref 12.3–15.4)
WBC: 10.6 10*3/uL (ref 3.4–10.8)

## 2015-05-10 LAB — COMPREHENSIVE METABOLIC PANEL
ALBUMIN: 4.8 g/dL (ref 3.6–4.8)
ALT: 25 IU/L (ref 0–44)
AST: 24 IU/L (ref 0–40)
Albumin/Globulin Ratio: 2.1 (ref 1.2–2.2)
Alkaline Phosphatase: 69 IU/L (ref 39–117)
BILIRUBIN TOTAL: 1.1 mg/dL (ref 0.0–1.2)
BUN / CREAT RATIO: 15 (ref 10–24)
BUN: 17 mg/dL (ref 8–27)
CALCIUM: 9.6 mg/dL (ref 8.6–10.2)
CHLORIDE: 97 mmol/L (ref 96–106)
CO2: 26 mmol/L (ref 18–29)
Creatinine, Ser: 1.1 mg/dL (ref 0.76–1.27)
GFR, EST AFRICAN AMERICAN: 84 mL/min/{1.73_m2} (ref >59)
GFR, EST NON AFRICAN AMERICAN: 73 mL/min/{1.73_m2} (ref >59)
GLUCOSE: 91 mg/dL (ref 65–99)
Globulin, Total: 2.3 g/dL (ref 1.5–4.5)
Potassium: 3.8 mmol/L (ref 3.5–5.2)
Sodium: 140 mmol/L (ref 134–144)
TOTAL PROTEIN: 7.1 g/dL (ref 6.0–8.5)

## 2015-05-30 ENCOUNTER — Encounter: Payer: Self-pay | Admitting: Family Medicine

## 2015-05-30 ENCOUNTER — Ambulatory Visit (INDEPENDENT_AMBULATORY_CARE_PROVIDER_SITE_OTHER): Payer: BLUE CROSS/BLUE SHIELD | Admitting: Family Medicine

## 2015-05-30 VITALS — BP 110/62 | HR 52 | Temp 98.7°F | Resp 16 | Wt 213.0 lb

## 2015-05-30 DIAGNOSIS — K2100 Gastro-esophageal reflux disease with esophagitis, without bleeding: Secondary | ICD-10-CM

## 2015-05-30 DIAGNOSIS — I10 Essential (primary) hypertension: Secondary | ICD-10-CM

## 2015-05-30 NOTE — Progress Notes (Signed)
Patient ID: [name], male   DOB: 1955-06-27, 60 y.o.   MRN: [id number]       Patient: [name] Male    DOB: 07/04/1955   60 y.o.   MRN: [id number] Visit Date: 05/30/2015  Today's Provider: Vernie Murders, PA   Chief Complaint  Patient presents with  . Hypertension   Subjective:    Hypertension The problem is controlled. Associated symptoms include headaches. (Lightheadedness, dizziness) There are no compliance problems.    Patient was seen by Dr. Caryn Section on 05/09/15 and c/o dizziness. He was advised if SBP <140, and DBP <90 he is to take 1 tablet of hydralazine instead of 1.5 tablets. Patient also mentions that he had a F/U visit with Dr. Ubaldo Glassing last week and he recommended that the patient discontinue hydralazine altogether. Patient reports that since he stopped taking the medication, his dizziness has resolved. No longer having dyspepsia and has stopped the Dexilant. Occasionally will use Omeprazole 20 mg qd prn.     Past Medical History  Diagnosis Date  . Coronary artery disease   . Hypertension   . Sleep apnea   . Anginal pain ([place])   . Myocardial infarction ([place])   . GERD (gastroesophageal reflux disease)   . Smoker   . Hypercholesterolemia   . Left main coronary artery disease 06/05/2012  . Essential hypertension, benign 06/05/2012  . Tobacco abuse 06/05/2012  . Hyperlipidemia 06/05/2012  . Obesity (BMI 30-39.9) 06/05/2012  . Obstructive sleep apnea 06/05/2012  . Vitamin D deficiency   . Allergy   . History of herpes zoster   . Colon polyp 2008    Colonoscopy due to repeat in 2011 due to polyps   Past Surgical History  Procedure Laterality Date  . Cardiac catheterization    . Tonsillectomy    . Coronary artery bypass graft N/A 06/06/2012    Procedure: CORONARY ARTERY BYPASS GRAFTING (CABG);  Surgeon: Grace Isaac, MD;  Location: [facility];  Service: Open Heart Surgery;  Laterality: N/A;  x4 using right greater saphenous vein and left internal mammary.   .  Intraoperative transesophageal echocardiogram N/A 06/06/2012    Procedure: INTRAOPERATIVE TRANSESOPHAGEAL ECHOCARDIOGRAM;  Surgeon: Grace Isaac, MD;  Location: [facility];  Service: Open Heart Surgery;  Laterality: N/A;  . Endovein harvest of greater saphenous vein Right 06/06/2012    Procedure: ENDOVEIN HARVEST OF GREATER SAPHENOUS VEIN;  Surgeon: Grace Isaac, MD;  Location: [facility];  Service: Open Heart Surgery;  Laterality: Right;  . Lithotripsy  01/11/2008   Family History  Problem Relation Age of Onset  . Hypertension Other    Allergies  Allergen Reactions  . Sulfa Antibiotics     Other reaction(s): Unknown "Makes him feel worse then better when taken"per wife   Previous Medications   ACETAMINOPHEN (TYLENOL) 325 MG TABLET    Take 2 tablets (650 mg total) by mouth every 6 (six) hours as needed for mild pain (or Fever >/= 101).   ASPIRIN EC 325 MG EC TABLET    Take 1 tablet (325 mg total) by mouth daily.   CETIRIZINE (ZYRTEC) 10 MG TABLET    Take 10 mg by mouth every evening.   CHOLECALCIFEROL (VITAMIN D) 1000 UNITS TABLET    Take 2,000 Units by mouth every evening.   CRESTOR 20 MG TABLET    TAKE 1 TABLET BY MOUTH EVERY DAY   FLUTICASONE (FLONASE) 50 MCG/ACT NASAL SPRAY    SPRAY TWICE IN EACH NOSTRIL ONCE DAILY   LOSARTAN-HYDROCHLOROTHIAZIDE (  HYZAAR) 100-12.5 MG PER TABLET       METOPROLOL SUCCINATE (TOPROL-XL) 50 MG 24 HR TABLET    TAKE 1 TABLET (50 MG TOTAL) BY MOUTH ONCE DAILY.    Review of Systems  Constitutional: Negative.   Respiratory: Negative.   Cardiovascular: Negative.   Musculoskeletal: Negative.   Neurological: Positive for light-headedness and headaches.    Social History  Substance Use Topics  . Smoking status: Former Smoker -- 1.50 packs/day for 37 years    Types: Cigarettes    Quit date: 06/05/2012  . Smokeless tobacco: Never Used  . Alcohol Use: No   Objective:   BP 110/62 mmHg  Pulse 52  Temp(Src) 98.7 F (37.1 C)  Resp 16  Wt 213 lb (96.616 kg)   SpO2 96%  Physical Exam  Constitutional: He is oriented to person, place, and time. He appears well-developed and well-nourished. No distress.  HENT:  Head: Normocephalic and atraumatic.  Right Ear: Hearing normal.  Left Ear: Hearing normal.  Nose: Nose normal.  Eyes: Conjunctivae and lids are normal. Right eye exhibits no discharge. Left eye exhibits no discharge. No scleral icterus.  Neck: Neck supple.  Cardiovascular: Normal rate and regular rhythm.   Pulmonary/Chest: Effort normal and breath sounds normal. No respiratory distress.  Abdominal: Soft. Bowel sounds are normal.  Musculoskeletal: Normal range of motion.  Neurological: He is alert and oriented to person, place, and time.  Skin: Skin is intact. No lesion and no rash noted.  Psychiatric: He has a normal mood and affect. His speech is normal and behavior is normal. Thought content normal.      Assessment & Plan:     1. Essential (primary) hypertension Good control of BP. Cardiologist stopped the Hydralazine and Dexilant. Feeling much better and rare dyspepsia now. Will continue Hyzaar 100-12.5 mg qd with Toprol-XL 50 mg qd. Had good report from functional study (no ischemia). Recheck in 4 months and get repeat labs at that time.  2. Gastroesophageal reflux disease with esophagitis Improved with elevation of head of bed and rarely using Omeprazole 20 mg now. Has appointment scheduled with gastroenterologist (Dr. Donnella Sham) for 06-10-15 and possible upper endoscopy. Dyspepsia greatly improved now.       Vernie Murders, PA  [facility]

## 2015-06-09 ENCOUNTER — Ambulatory Visit: Payer: BLUE CROSS/BLUE SHIELD | Admitting: Family Medicine

## 2015-06-09 ENCOUNTER — Encounter: Payer: Self-pay | Admitting: *Deleted

## 2015-06-10 ENCOUNTER — Ambulatory Visit: Payer: BLUE CROSS/BLUE SHIELD | Admitting: Anesthesiology

## 2015-06-10 ENCOUNTER — Encounter
Admission: RE | Disposition: A | Discharge status: Home / Self Care | Payer: Self-pay | Source: Ambulatory Visit | Attending: Gastroenterology

## 2015-06-10 ENCOUNTER — Ambulatory Visit
Admission: RE | Admit: 2015-06-10 | Discharge: 2015-06-10 | Disposition: A | Discharge status: Home / Self Care | Payer: BLUE CROSS/BLUE SHIELD | Source: Ambulatory Visit | Attending: Gastroenterology | Admitting: Gastroenterology

## 2015-06-10 DIAGNOSIS — K219 Gastro-esophageal reflux disease without esophagitis: Secondary | ICD-10-CM | POA: Diagnosis present

## 2015-06-10 DIAGNOSIS — I2510 Atherosclerotic heart disease of native coronary artery without angina pectoris: Secondary | ICD-10-CM | POA: Diagnosis not present

## 2015-06-10 DIAGNOSIS — E7800 Pure hypercholesterolemia, unspecified: Secondary | ICD-10-CM | POA: Insufficient documentation

## 2015-06-10 DIAGNOSIS — Z7982 Long term (current) use of aspirin: Secondary | ICD-10-CM | POA: Diagnosis not present

## 2015-06-10 DIAGNOSIS — Z951 Presence of aortocoronary bypass graft: Secondary | ICD-10-CM | POA: Insufficient documentation

## 2015-06-10 DIAGNOSIS — K5730 Diverticulosis of large intestine without perforation or abscess without bleeding: Secondary | ICD-10-CM | POA: Diagnosis not present

## 2015-06-10 DIAGNOSIS — K2210 Ulcer of esophagus without bleeding: Secondary | ICD-10-CM | POA: Insufficient documentation

## 2015-06-10 DIAGNOSIS — E559 Vitamin D deficiency, unspecified: Secondary | ICD-10-CM | POA: Diagnosis not present

## 2015-06-10 DIAGNOSIS — I252 Old myocardial infarction: Secondary | ICD-10-CM | POA: Diagnosis not present

## 2015-06-10 DIAGNOSIS — E785 Hyperlipidemia, unspecified: Secondary | ICD-10-CM | POA: Diagnosis not present

## 2015-06-10 DIAGNOSIS — I10 Essential (primary) hypertension: Secondary | ICD-10-CM | POA: Diagnosis not present

## 2015-06-10 DIAGNOSIS — G4733 Obstructive sleep apnea (adult) (pediatric): Secondary | ICD-10-CM | POA: Diagnosis not present

## 2015-06-10 DIAGNOSIS — K210 Gastro-esophageal reflux disease with esophagitis: Secondary | ICD-10-CM | POA: Diagnosis not present

## 2015-06-10 DIAGNOSIS — E669 Obesity, unspecified: Secondary | ICD-10-CM | POA: Diagnosis not present

## 2015-06-10 DIAGNOSIS — K2980 Duodenitis without bleeding: Secondary | ICD-10-CM | POA: Diagnosis not present

## 2015-06-10 DIAGNOSIS — Z86010 Personal history of colonic polyps: Secondary | ICD-10-CM | POA: Insufficient documentation

## 2015-06-10 DIAGNOSIS — Z79899 Other long term (current) drug therapy: Secondary | ICD-10-CM | POA: Diagnosis not present

## 2015-06-10 DIAGNOSIS — Z6833 Body mass index (BMI) 33.0-33.9, adult: Secondary | ICD-10-CM | POA: Insufficient documentation

## 2015-06-10 DIAGNOSIS — K259 Gastric ulcer, unspecified as acute or chronic, without hemorrhage or perforation: Secondary | ICD-10-CM | POA: Diagnosis not present

## 2015-06-10 DIAGNOSIS — K224 Dyskinesia of esophagus: Secondary | ICD-10-CM | POA: Diagnosis not present

## 2015-06-10 DIAGNOSIS — Z1211 Encounter for screening for malignant neoplasm of colon: Secondary | ICD-10-CM | POA: Diagnosis not present

## 2015-06-10 DIAGNOSIS — K2950 Unspecified chronic gastritis without bleeding: Secondary | ICD-10-CM | POA: Diagnosis not present

## 2015-06-10 HISTORY — PX: COLONOSCOPY WITH PROPOFOL: SHX5780

## 2015-06-10 HISTORY — PX: ESOPHAGOGASTRODUODENOSCOPY (EGD) WITH PROPOFOL: SHX5813

## 2015-06-10 SURGERY — ESOPHAGOGASTRODUODENOSCOPY (EGD) WITH PROPOFOL
Anesthesia: General

## 2015-06-10 MED ORDER — SODIUM CHLORIDE 0.9 % IV SOLN
INTRAVENOUS | Status: DC
  Administered 2015-06-10 (×2): via INTRAVENOUS

## 2015-06-10 MED ORDER — FENTANYL CITRATE (PF) 100 MCG/2ML IJ SOLN
INTRAMUSCULAR | Status: DC | PRN
  Administered 2015-06-10: 50 ug via INTRAVENOUS

## 2015-06-10 MED ORDER — METOPROLOL TARTRATE 25 MG PO TABS
25.0000 mg | ORAL_TABLET | Freq: Once | ORAL | Status: AC
  Administered 2015-06-10: 25 mg via ORAL

## 2015-06-10 MED ORDER — METOPROLOL TARTRATE 25 MG PO TABS
ORAL_TABLET | ORAL | Status: AC
  Administered 2015-06-10: 25 mg via ORAL
  Filled 2015-06-10: qty 1

## 2015-06-10 MED ORDER — PROPOFOL 10 MG/ML IV BOLUS
INTRAVENOUS | Status: DC | PRN
  Administered 2015-06-10: 50 mg via INTRAVENOUS

## 2015-06-10 MED ORDER — SODIUM CHLORIDE 0.9 % IV SOLN: INTRAVENOUS | Status: DC

## 2015-06-10 MED ORDER — PROPOFOL 500 MG/50ML IV EMUL
INTRAVENOUS | Status: DC | PRN
  Administered 2015-06-10: 125 ug/kg/min via INTRAVENOUS

## 2015-06-10 MED ORDER — MIDAZOLAM HCL 2 MG/2ML IJ SOLN
INTRAMUSCULAR | Status: DC | PRN
  Administered 2015-06-10: 1 mg via INTRAVENOUS

## 2015-06-10 NOTE — Transfer of Care (Signed)
Immediate Anesthesia Transfer of Care Note  Patient: [name]  Procedure(s) Performed: Procedure(s): ESOPHAGOGASTRODUODENOSCOPY (EGD) WITH PROPOFOL (N/A) COLONOSCOPY WITH PROPOFOL (N/A)  Patient Location: PACU  Anesthesia Type:General  Level of Consciousness: sedated  Airway & Oxygen Therapy: Patient Spontanous Breathing and Patient connected to nasal cannula oxygen  Post-op Assessment: Report given to RN and Post -op Vital signs reviewed and stable  Post vital signs: Reviewed and stable  Last Vitals:  Filed Vitals:   06/10/15 1314 06/10/15 1507  BP: 156/90 94/56  Pulse: 94 66  Temp: 36.3 C 36.2 C  Resp: 17 14    Last Pain: There were no vitals filed for this visit.       Complications: No apparent anesthesia complications

## 2015-06-10 NOTE — Anesthesia Postprocedure Evaluation (Signed)
Anesthesia Post Note  Patient: [name]  Procedure(s) Performed: Procedure(s) (LRB): ESOPHAGOGASTRODUODENOSCOPY (EGD) WITH PROPOFOL (N/A) COLONOSCOPY WITH PROPOFOL (N/A)  Patient location during evaluation: Endoscopy Anesthesia Type: General Level of consciousness: awake and alert Pain management: pain level controlled Vital Signs Assessment: post-procedure vital signs reviewed and stable Respiratory status: spontaneous breathing and respiratory function stable Cardiovascular status: stable Anesthetic complications: no    Last Vitals:  Filed Vitals:   06/10/15 1314 06/10/15 1507  BP: 156/90 94/56  Pulse: 94 66  Temp: 36.3 C 36.2 C  Resp: 17 14    Last Pain: There were no vitals filed for this visit.               Itzamar Traynor K

## 2015-06-10 NOTE — Anesthesia Preprocedure Evaluation (Signed)
Anesthesia Evaluation  Patient identified by MRN, date of birth, ID band Patient awake    Reviewed: Allergy & Precautions, NPO status , Patient's Chart, lab work & pertinent test results  History of Anesthesia Complications Negative for: history of anesthetic complications  Airway Mallampati: II       Dental   Pulmonary sleep apnea and Continuous Positive Airway Pressure Ventilation , former smoker,           Cardiovascular hypertension, Pt. on medications and Pt. on home beta blockers + angina + CAD, + Past MI and + CABG       Neuro/Psych negative neurological ROS     GI/Hepatic Neg liver ROS, GERD  Medicated,  Endo/Other  negative endocrine ROS  Renal/GU Renal disease (Stones)negative Renal ROS     Musculoskeletal   Abdominal   Peds  Hematology negative hematology ROS (+)   Anesthesia Other Findings   Reproductive/Obstetrics                             Anesthesia Physical Anesthesia Plan  ASA: III  Anesthesia Plan: General   Post-op Pain Management:    Induction: Intravenous  Airway Management Planned: Nasal Cannula  Additional Equipment:   Intra-op Plan:   Post-operative Plan:   Informed Consent: I have reviewed the patients History and Physical, chart, labs and discussed the procedure including the risks, benefits and alternatives for the proposed anesthesia with the patient or authorized representative who has indicated his/her understanding and acceptance.     Plan Discussed with:   Anesthesia Plan Comments:         Anesthesia Quick Evaluation

## 2015-06-10 NOTE — H&P (Signed)
Outpatient short stay form Pre-procedure 06/10/2015 2:23 PM Thomas Sails MD  Primary Physician: Dr. Jordan Booker  Reason for visit:  EGD and colonoscopy  History of present illness:  Patient is a 60 year old male presenting today for procedures as above. He has a history of severe gastroesophageal reflux for which she has been on medications and then off medications. He is been having nighttime awakenings with choking and shortness of breath. He also has a personal history of obstructive sleep apnea. He was diagnosed with this about 10 years ago but does not use any device for this. He does take 81 mg aspirin that he is held for a couple days. He denies use of any other aspirin products for her thinning agents. Size a personal history of adenomatous colon polyps last colonoscopy being done in 2008.  Current facility-administered medications:  .  0.9 %  sodium chloride infusion, , Intravenous, Continuous, Thomas Sails, MD, Last Rate: 20 mL/hr at 06/10/15 1323 .  0.9 %  sodium chloride infusion, , Intravenous, Continuous, Thomas Sails, MD  Prescriptions prior to admission  Medication Sig Dispense Refill Last Dose  . acetaminophen (TYLENOL) 325 MG tablet Take 2 tablets (650 mg total) by mouth every 6 (six) hours as needed for mild pain (or Fever >/= 101).   Taking  . aspirin EC 325 MG EC tablet Take 1 tablet (325 mg total) by mouth daily.   Taking  . cetirizine (ZYRTEC) 10 MG tablet Take 10 mg by mouth every evening.   Taking  . cholecalciferol (VITAMIN D) 1000 UNITS tablet Take 2,000 Units by mouth every evening.   Taking  . CRESTOR 20 MG tablet TAKE 1 TABLET BY MOUTH EVERY DAY 30 tablet 6 Taking  . fluticasone (FLONASE) 50 MCG/ACT nasal spray SPRAY TWICE IN EACH NOSTRIL ONCE DAILY  12 Taking  . losartan-hydrochlorothiazide (HYZAAR) 100-12.5 MG per tablet   10 Taking  . metoprolol succinate (TOPROL-XL) 50 MG 24 hr tablet TAKE 1 TABLET (50 MG TOTAL) BY MOUTH ONCE DAILY.  11 Taking      Allergies  Allergen Reactions  . Sulfa Antibiotics     Other reaction(s): Unknown "Makes him feel worse then better when taken"per wife     Past Medical History  Diagnosis Date  . Coronary artery disease   . Hypertension   . Sleep apnea   . Anginal pain ([place])   . Myocardial infarction ([place])   . GERD (gastroesophageal reflux disease)   . Smoker   . Hypercholesterolemia   . Left main coronary artery disease 06/05/2012  . Essential hypertension, benign 06/05/2012  . Tobacco abuse 06/05/2012  . Hyperlipidemia 06/05/2012  . Obesity (BMI 30-39.9) 06/05/2012  . Obstructive sleep apnea 06/05/2012  . Vitamin D deficiency   . Allergy   . History of herpes zoster   . Colon polyp 2008    Colonoscopy due to repeat in 2011 due to polyps    Review of systems:      Physical Exam    Heart and lungs: Regular rate and rhythm without rub or gallop, lungs are bilaterally clear.    HEENT:  septic atraumatic eyes are anicteric    Other:     Pertinant exam for procedure:  off nontender nondistended bowel sounds positive normoactive.    Planned proceedures:  EGD, colonoscopy and indicated procedures. I have discussed the risks benefits and complications of procedures to include not limited to bleeding, infection, perforation and the risk of sedation and the patient wishes  to proceed.    Thomas Sails, MD Gastroenterology 06/10/2015  2:23 PM

## 2015-06-10 NOTE — Op Note (Signed)
[facility] Gastroenterology Patient Name: [name] Procedure Date: 06/10/2015 2:19 PM MRN: [id number] Account #: [id number] Date of Birth: 12/02/1955 Admit Type: Outpatient Age: 60 Room: [facility] ENDO ROOM 2 Gender: Male Note Status: Finalized Procedure:            Colonoscopy Indications:          Personal history of colonic polyps Providers:            Lollie Sails, MD Referring MD:         Signe Colt (Referring MD) Medicines:            Monitored Anesthesia Care Complications:        No immediate complications. Procedure:            Pre-Anesthesia Assessment:                       - ASA Grade Assessment: III - A patient with severe                        systemic disease.                       After obtaining informed consent, the colonoscope was                        passed under direct vision. Throughout the procedure,                        the patient's blood pressure, pulse, and oxygen                        saturations were monitored continuously. The                        Colonoscope was introduced through the anus and                        advanced to the the cecum, identified by appendiceal                        orifice and ileocecal valve. The colonoscopy was                        performed without difficulty. The patient tolerated the                        procedure well. The quality of the bowel preparation                        was fair. Findings:      Multiple small-mouthed diverticula were found in the sigmoid colon and       descending colon.      The exam was otherwise without abnormality.      The retroflexed view of the distal rectum and anal verge was normal and       showed no anal or rectal abnormalities.      The digital rectal exam was normal. Impression:           - Preparation of the colon was fair.                       -  Diverticulosis in the sigmoid colon and in the                        descending colon.                      - The examination was otherwise normal.                       - The distal rectum and anal verge are normal on                        retroflexion view.                       - No specimens collected. Recommendation:       - Discharge patient to home.                       - Repeat colonoscopy in 10 years for screening purposes. Procedure Code(s):    --- Professional ---                       [phone], Colonoscopy, flexible; diagnostic, including                        collection of specimen(s) by brushing or washing, when                        performed (separate procedure) Diagnosis Code(s):    --- Professional ---                       Z86.010, Personal history of colonic polyps                       K57.30, Diverticulosis of large intestine without                        perforation or abscess without bleeding CPT copyright 2016 American [employer] Association. All rights reserved. The codes documented in this report are preliminary and upon coder review may  be revised to meet current compliance requirements. Lollie Sails, MD 06/10/2015 3:02:06 PM This report has been signed electronically. Number of Addenda: 0 Note Initiated On: 06/10/2015 2:19 PM Scope Withdrawal Time: 0 hours 6 minutes 8 seconds  Total Procedure Duration: 0 hours 15 minutes 42 seconds       [facility]

## 2015-06-10 NOTE — Op Note (Signed)
[facility] Gastroenterology Patient Name: [name] Procedure Date: 06/10/2015 2:20 PM MRN: [id number] Account #: [id number] Date of Birth: [birth year] Admit Type: Outpatient Age: 60 Room: [facility] ENDO ROOM 2 Gender: Male Note Status: Finalized Procedure:            Upper GI endoscopy Indications:          Esophageal reflux symptoms that recur despite                        appropriate therapy Providers:            Lollie Sails, MD Referring MD:         Signe Colt (Referring MD) Medicines:            Monitored Anesthesia Care Complications:        No immediate complications. Procedure:            Pre-Anesthesia Assessment:                       - ASA Grade Assessment: III - A patient with severe                        systemic disease.                       After obtaining informed consent, the endoscope was                        passed under direct vision. Throughout the procedure,                        the patient's blood pressure, pulse, and oxygen                        saturations were monitored continuously. The                        Colonoscope was introduced through the mouth, and                        advanced to the third part of duodenum. The upper GI                        endoscopy was accomplished without difficulty. The                        patient tolerated the procedure well. Findings:      LA Grade B (one or more mucosal breaks greater than 5 mm, not extending       between the tops of two mucosal folds) esophagitis with no bleeding was       found. Biopsies were taken with a cold forceps for histology.      The exam of the esophagus was otherwise normal. Of note, therre is       minimal evidence of ittitation at the aretynoid area.      Patchy moderate inflammation characterized by congestion (edema) and       linear erosions was found in the gastric body. Biopsies were taken with       a cold forceps for histology. Biopsies  were taken with a cold forceps       for  Helicobacter pylori testing.      Patchy mild inflammation characterized by erythema and granularity was       found in the duodenal bulb.      The cardia and gastric fundus were normal on retroflexion. Impression:           - LA Grade B erosive esophagitis. Biopsied.                       - Erosive gastritis. Biopsied.                       - Duodenitis. Recommendation:       - Await pathology results.                       - Use Protonix (pantoprazole) 40 mg PO daily daily.                       - Return to GI clinic in 5 weeks. Procedure Code(s):    --- Professional ---                       [phone], Esophagogastroduodenoscopy, flexible, transoral;                        with biopsy, single or multiple CPT copyright 2016 American Medical Association. All rights reserved. The codes documented in this report are preliminary and upon coder review may  be revised to meet current compliance requirements. Lollie Sails, MD 06/10/2015 2:41:43 PM This report has been signed electronically. Number of Addenda: 0 Note Initiated On: 06/10/2015 2:20 PM      [facility]

## 2015-06-10 NOTE — Anesthesia Procedure Notes (Signed)
Date/Time: 06/10/2015 2:35 PM Performed by: Nelda Marseille Pre-anesthesia Checklist: Patient identified, Emergency Drugs available, Suction available, Patient being monitored and Timeout performed Oxygen Delivery Method: Nasal cannula

## 2015-06-11 ENCOUNTER — Encounter: Payer: Self-pay | Admitting: Gastroenterology

## 2015-06-11 ENCOUNTER — Telehealth: Payer: Self-pay | Admitting: Family Medicine

## 2015-06-11 NOTE — Telephone Encounter (Signed)
error 

## 2015-06-13 LAB — SURGICAL PATHOLOGY

## 2015-06-23 NOTE — Telephone Encounter (Signed)
-----   Message from Margo Common, [place] sent at 06/13/2015 12:05 PM EDT ----- Upper endoscopy by Dr. Donnella Sham showed reflux gastroesophagitis. No sign of dysplasia or malignancy. Some esophageal spasms, duodenitis and diverticulosis noted. Proceed with instructions by Dr. Donnella Sham or Omeprazole as needed.

## 2015-06-23 NOTE — Telephone Encounter (Signed)
Patient advised as directed below.  Thanks,  -Toy Eisemann 

## 2015-10-31 ENCOUNTER — Encounter: Payer: Self-pay | Admitting: Family Medicine

## 2015-10-31 ENCOUNTER — Ambulatory Visit (INDEPENDENT_AMBULATORY_CARE_PROVIDER_SITE_OTHER): Payer: 59 | Admitting: Family Medicine

## 2015-10-31 VITALS — BP 118/82 | HR 58 | Temp 97.7°F | Resp 14 | Wt 213.2 lb

## 2015-10-31 DIAGNOSIS — L821 Other seborrheic keratosis: Secondary | ICD-10-CM

## 2015-10-31 DIAGNOSIS — E7800 Pure hypercholesterolemia, unspecified: Secondary | ICD-10-CM | POA: Diagnosis not present

## 2015-10-31 DIAGNOSIS — K2100 Gastro-esophageal reflux disease with esophagitis, without bleeding: Secondary | ICD-10-CM

## 2015-10-31 DIAGNOSIS — I10 Essential (primary) hypertension: Secondary | ICD-10-CM

## 2015-10-31 NOTE — Progress Notes (Signed)
Patient: [name] Male    DOB: [birth year]   60 y.o.   MRN: [id number] Visit Date: 10/31/2015  Today's Provider: Vernie Murders, PA   Chief Complaint  Patient presents with  . Hyperlipidemia  . Gastroesophageal Reflux  . Hypertension  . Follow-up   Subjective:    HPI   Hypertension, follow-up:  BP Readings from Last 3 Encounters:  10/31/15 118/82  06/10/15 (!) 89/67  05/30/15 110/62    He was last seen for hypertension 5 months ago.  BP at that visit was 110/62. Management since that visit includes continue Hyzaar and Toprol.He reports excellent compliance with treatment. He is not having side effects.  He is not exercising. He is not adherent to low salt diet.   Outside blood pressures are being checked. He is experiencing none.  Patient denies chest pain, irregular heart beat and palpitations.   Cardiovascular risk factors include advanced age (older than 37 for men, 50 for women), dyslipidemia, hypertension, male gender and obesity (BMI >= 30 kg/m2).  Use of agents associated with hypertension: none.   ------------------------------------------------------------------------    Lipid/Cholesterol, Follow-up:   Last seen for this 5 months ago.  Management since that visit includes continue Crestor.  Last Lipid Panel:    Component Value Date/Time   CHOL 155 08/27/2013   TRIG 130 08/27/2013   HDL 45 08/27/2013   CHOLHDL 3.4 06/06/2012 0436   VLDL 22 06/06/2012 0436   LDLCALC 84 08/27/2013    He reports excellent compliance with treatment. He is not having side effects.   Wt Readings from Last 3 Encounters:  10/31/15 213 lb 3.2 oz (96.7 kg)  06/10/15 210 lb (95.3 kg)  05/30/15 213 lb (96.6 kg)    ------------------------------------------------------------------------  GERD, Follow up:  The patient was last seen for GERD 5 months ago. Changes made since that visit include continue Omeprazole and scheduled appt with Dr. Donnella Sham for possible  upper endoscopy.  He reports excellent compliance with treatment. He is not having side effects. Marland Kitchen  He IS experiencing no symptoms. He is NOT experiencing any symptoms  ------------------------------------------------------------------------  Past Medical History:  Diagnosis Date  . Allergy   . Anginal pain ([place])   . Colon polyp 2008   Colonoscopy due to repeat in 2011 due to polyps  . Coronary artery disease   . Essential hypertension, benign 06/05/2012  . GERD (gastroesophageal reflux disease)   . History of herpes zoster   . Hypercholesterolemia   . Hyperlipidemia 06/05/2012  . Hypertension   . Left main coronary artery disease 06/05/2012  . Myocardial infarction   . Obesity (BMI 30-39.9) 06/05/2012  . Obstructive sleep apnea 06/05/2012  . Sleep apnea   . Smoker   . Tobacco abuse 06/05/2012  . Vitamin D deficiency    Past Surgical History:  Procedure Laterality Date  . CARDIAC CATHETERIZATION    . COLONOSCOPY WITH PROPOFOL N/A 06/10/2015   Procedure: COLONOSCOPY WITH PROPOFOL;  Surgeon: Lollie Sails, MD;  Location: [facility] ENDOSCOPY;  Service: Endoscopy;  Laterality: N/A;  . CORONARY ARTERY BYPASS GRAFT N/A 06/06/2012   Procedure: CORONARY ARTERY BYPASS GRAFTING (CABG);  Surgeon: Grace Isaac, MD;  Location: [place];  Service: Open Heart Surgery;  Laterality: N/A;  x4 using right greater saphenous vein and left internal mammary.   . ENDOVEIN HARVEST OF GREATER SAPHENOUS VEIN Right 06/06/2012   Procedure: ENDOVEIN HARVEST OF GREATER SAPHENOUS VEIN;  Surgeon: Grace Isaac, MD;  Location: [place];  Service: Open Heart Surgery;  Laterality: Right;  . ESOPHAGOGASTRODUODENOSCOPY (EGD) WITH PROPOFOL N/A 06/10/2015   Procedure: ESOPHAGOGASTRODUODENOSCOPY (EGD) WITH PROPOFOL;  Surgeon: Lollie Sails, MD;  Location: [facility] ENDOSCOPY;  Service: Endoscopy;  Laterality: N/A;  . INTRAOPERATIVE TRANSESOPHAGEAL ECHOCARDIOGRAM N/A 06/06/2012   Procedure: INTRAOPERATIVE TRANSESOPHAGEAL  ECHOCARDIOGRAM;  Surgeon: Grace Isaac, MD;  Location: [place];  Service: Open Heart Surgery;  Laterality: N/A;  . LITHOTRIPSY  01/11/2008  . TONSILLECTOMY     Family History  Problem Relation Age of Onset  . Hypertension Other    Allergies  Allergen Reactions  . Sulfa Antibiotics     Other reaction(s): Unknown "Makes him feel worse then better when taken"per wife     Previous Medications   ACETAMINOPHEN (TYLENOL) 325 MG TABLET    Take 2 tablets (650 mg total) by mouth every 6 (six) hours as needed for mild pain (or Fever >/= 101).   ASPIRIN EC 325 MG EC TABLET    Take 1 tablet (325 mg total) by mouth daily.   CETIRIZINE (ZYRTEC) 10 MG TABLET    Take 10 mg by mouth every evening.   CHOLECALCIFEROL (VITAMIN D) 1000 UNITS TABLET    Take 2,000 Units by mouth every evening.   CRESTOR 20 MG TABLET    TAKE 1 TABLET BY MOUTH EVERY DAY   FLUTICASONE (FLONASE) 50 MCG/ACT NASAL SPRAY    SPRAY TWICE IN EACH NOSTRIL ONCE DAILY   LOSARTAN-HYDROCHLOROTHIAZIDE (HYZAAR) 100-12.5 MG PER TABLET       METOPROLOL SUCCINATE (TOPROL-XL) 50 MG 24 HR TABLET    TAKE 1 TABLET (50 MG TOTAL) BY MOUTH ONCE DAILY.   PANTOPRAZOLE (PROTONIX) 40 MG TABLET    TAKE 1 TABLET (40 MG TOTAL) BY MOUTH ONCE DAILY. ONE HOUR BEFORE A MEAL.    Review of Systems  Constitutional: Negative.   Respiratory: Negative.   Cardiovascular: Negative.   Gastrointestinal: Negative.   Musculoskeletal: Negative.     Social History  Substance Use Topics  . Smoking status: Former Smoker    Packs/day: 1.50    Years: 37.00    Types: Cigarettes    Quit date: 06/05/2012  . Smokeless tobacco: Never Used  . Alcohol use No   Objective:   BP 118/82 (BP Location: Right Arm, Patient Position: Sitting, Cuff Size: Large)   Pulse (!) 58   Temp 97.7 F (36.5 C) (Oral)   Resp 14   Wt 213 lb 3.2 oz (96.7 kg)   BMI 34.41 kg/m   Physical Exam  Constitutional: He is oriented to person, place, and time. He appears well-developed and  well-nourished. No distress.  HENT:  Head: Normocephalic and atraumatic.  Right Ear: Hearing normal.  Left Ear: Hearing normal.  Nose: Nose normal.  Eyes: Conjunctivae and lids are normal. Right eye exhibits no discharge. Left eye exhibits no discharge. No scleral icterus.  Neck: Neck supple.  Cardiovascular: Normal rate and regular rhythm.   Pulmonary/Chest: Effort normal and breath sounds normal. No respiratory distress.  Abdominal: Soft. Bowel sounds are normal.  Musculoskeletal: Normal range of motion.  Neurological: He is alert and oriented to person, place, and time.  Skin: Skin is intact. No lesion noted.  4-5 mm cauliflower-top flesh colored lesion on left forehead.  Psychiatric: He has a normal mood and affect. His speech is normal and behavior is normal. Thought content normal.      Assessment & Plan:     1. Hypercholesterolemia without hypertriglyceridemia Tolerating Crestor 20 mg qd without side effects. Trying to follow low  fat diet restrictions. Will get lab tests and follow up pending report. - Comprehensive metabolic panel - Lipid panel - TSH  2. Gastroesophageal reflux disease with esophagitis Stable and controlled by Pantoprazole. Followed by Dr. Donnella Sham (gastroenterologist). - CBC with Differential/Platelet - Comprehensive metabolic panel  3. Essential hypertension, benign Well controlled. Cardiologist stopped the Hydralazine. Still taking the Hyzaar and Metoprolol daily. Recheck labs and follow up in 3 months. - CBC with Differential/Platelet - Comprehensive metabolic panel - Lipid panel - TSH  4. Seborrheic keratoses Lesion on forehead frozen with Cryo-Pen for 90 seconds. Applied Band-Aid dressing. Recheck prn.

## 2015-11-08 LAB — CBC WITH DIFFERENTIAL/PLATELET
BASOS: 0 %
Basophils Absolute: 0 10*3/uL (ref 0.0–0.2)
EOS (ABSOLUTE): 0.1 10*3/uL (ref 0.0–0.4)
EOS: 2 %
HEMATOCRIT: 46.9 % (ref 37.5–51.0)
HEMOGLOBIN: 16.5 g/dL (ref 12.6–17.7)
Immature Grans (Abs): 0 10*3/uL (ref 0.0–0.1)
Immature Granulocytes: 0 %
LYMPHS ABS: 2.5 10*3/uL (ref 0.7–3.1)
Lymphs: 34 %
MCH: 31.4 pg (ref 26.6–33.0)
MCHC: 35.2 g/dL (ref 31.5–35.7)
MCV: 89 fL (ref 79–97)
MONOCYTES: 6 %
MONOS ABS: 0.5 10*3/uL (ref 0.1–0.9)
NEUTROS ABS: 4.2 10*3/uL (ref 1.4–7.0)
Neutrophils: 58 %
Platelets: 200 10*3/uL (ref 150–379)
RBC: 5.26 x10E6/uL (ref 4.14–5.80)
RDW: 12.9 % (ref 12.3–15.4)
WBC: 7.3 10*3/uL (ref 3.4–10.8)

## 2015-11-08 LAB — COMPREHENSIVE METABOLIC PANEL
A/G RATIO: 1.7 (ref 1.2–2.2)
ALBUMIN: 4.3 g/dL (ref 3.6–4.8)
ALK PHOS: 68 IU/L (ref 39–117)
ALT: 20 IU/L (ref 0–44)
AST: 24 IU/L (ref 0–40)
BILIRUBIN TOTAL: 1.1 mg/dL (ref 0.0–1.2)
BUN / CREAT RATIO: 16 (ref 10–24)
BUN: 18 mg/dL (ref 8–27)
CO2: 26 mmol/L (ref 18–29)
CREATININE: 1.15 mg/dL (ref 0.76–1.27)
Calcium: 9.5 mg/dL (ref 8.6–10.2)
Chloride: 99 mmol/L (ref 96–106)
GFR calc Af Amer: 80 mL/min/{1.73_m2} (ref >59)
GFR calc non Af Amer: 69 mL/min/{1.73_m2} (ref >59)
GLOBULIN, TOTAL: 2.5 g/dL (ref 1.5–4.5)
Glucose: 100 mg/dL — ABNORMAL HIGH (ref 65–99)
POTASSIUM: 3.9 mmol/L (ref 3.5–5.2)
SODIUM: 142 mmol/L (ref 134–144)
Total Protein: 6.8 g/dL (ref 6.0–8.5)

## 2015-11-08 LAB — LIPID PANEL
CHOL/HDL RATIO: 3.4 ratio (ref 0.0–5.0)
CHOLESTEROL TOTAL: 147 mg/dL (ref 100–199)
HDL: 43 mg/dL (ref >39)
LDL CALC: 84 mg/dL (ref 0–99)
TRIGLYCERIDES: 98 mg/dL (ref 0–149)
VLDL Cholesterol Cal: 20 mg/dL (ref 5–40)

## 2015-11-08 LAB — TSH: TSH: 1.94 u[IU]/mL (ref 0.450–4.500)

## 2015-11-10 ENCOUNTER — Telehealth: Payer: Self-pay

## 2015-11-10 NOTE — Telephone Encounter (Signed)
Patient advised.

## 2015-11-10 NOTE — Telephone Encounter (Signed)
-----   Message from Margo Common, [place] sent at 11/10/2015 10:11 AM EST ----- All blood tests normal. Continue present medication regimen and recheck in 4 months as planned.

## 2015-11-16 IMAGING — CR DG CHEST 2V
2 series · 2 of 2 positions shown · non-contrast
Comparison: July 27, 2012

CLINICAL DATA: Hypertension. History of coronary artery disease,
status post coronary artery bypass grafting.

EXAM:
CHEST  2 VIEW

[chest pa]
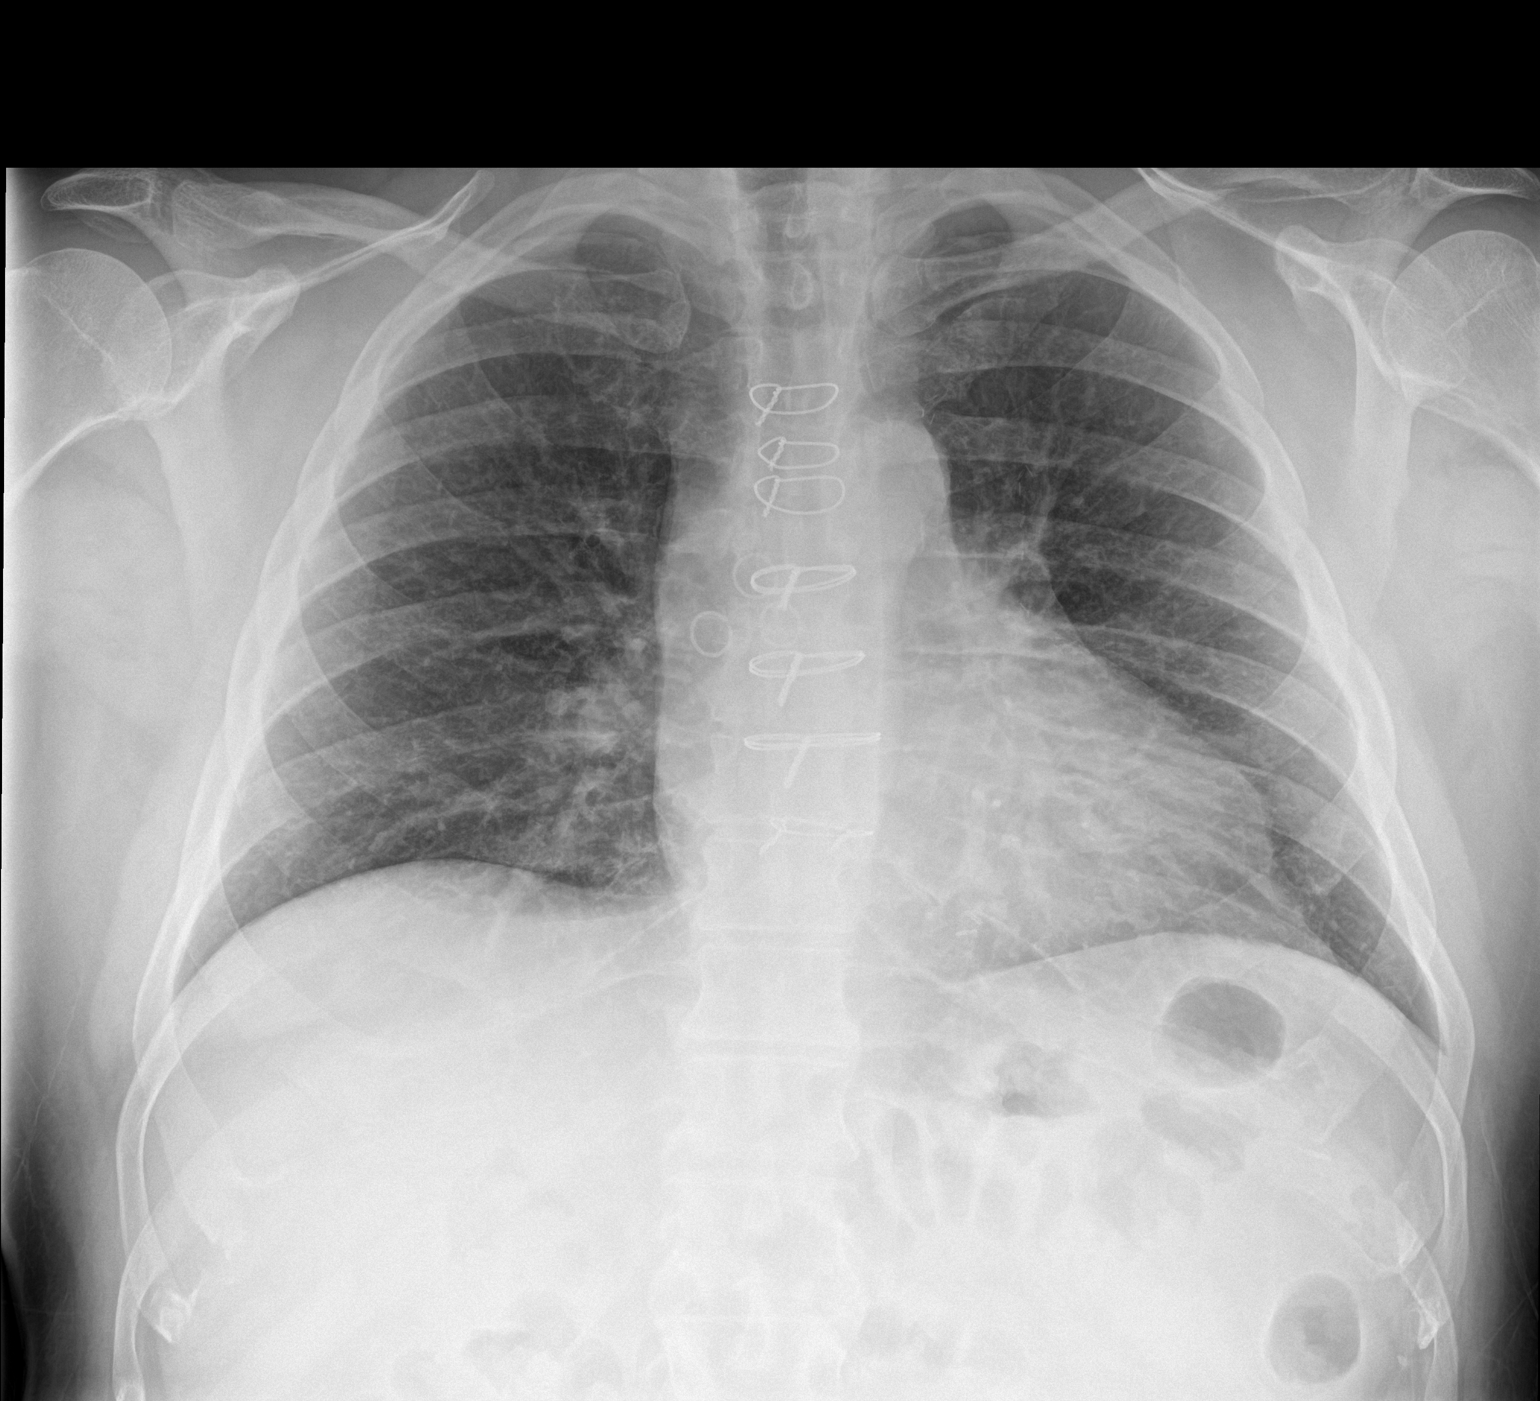

[chest lat]
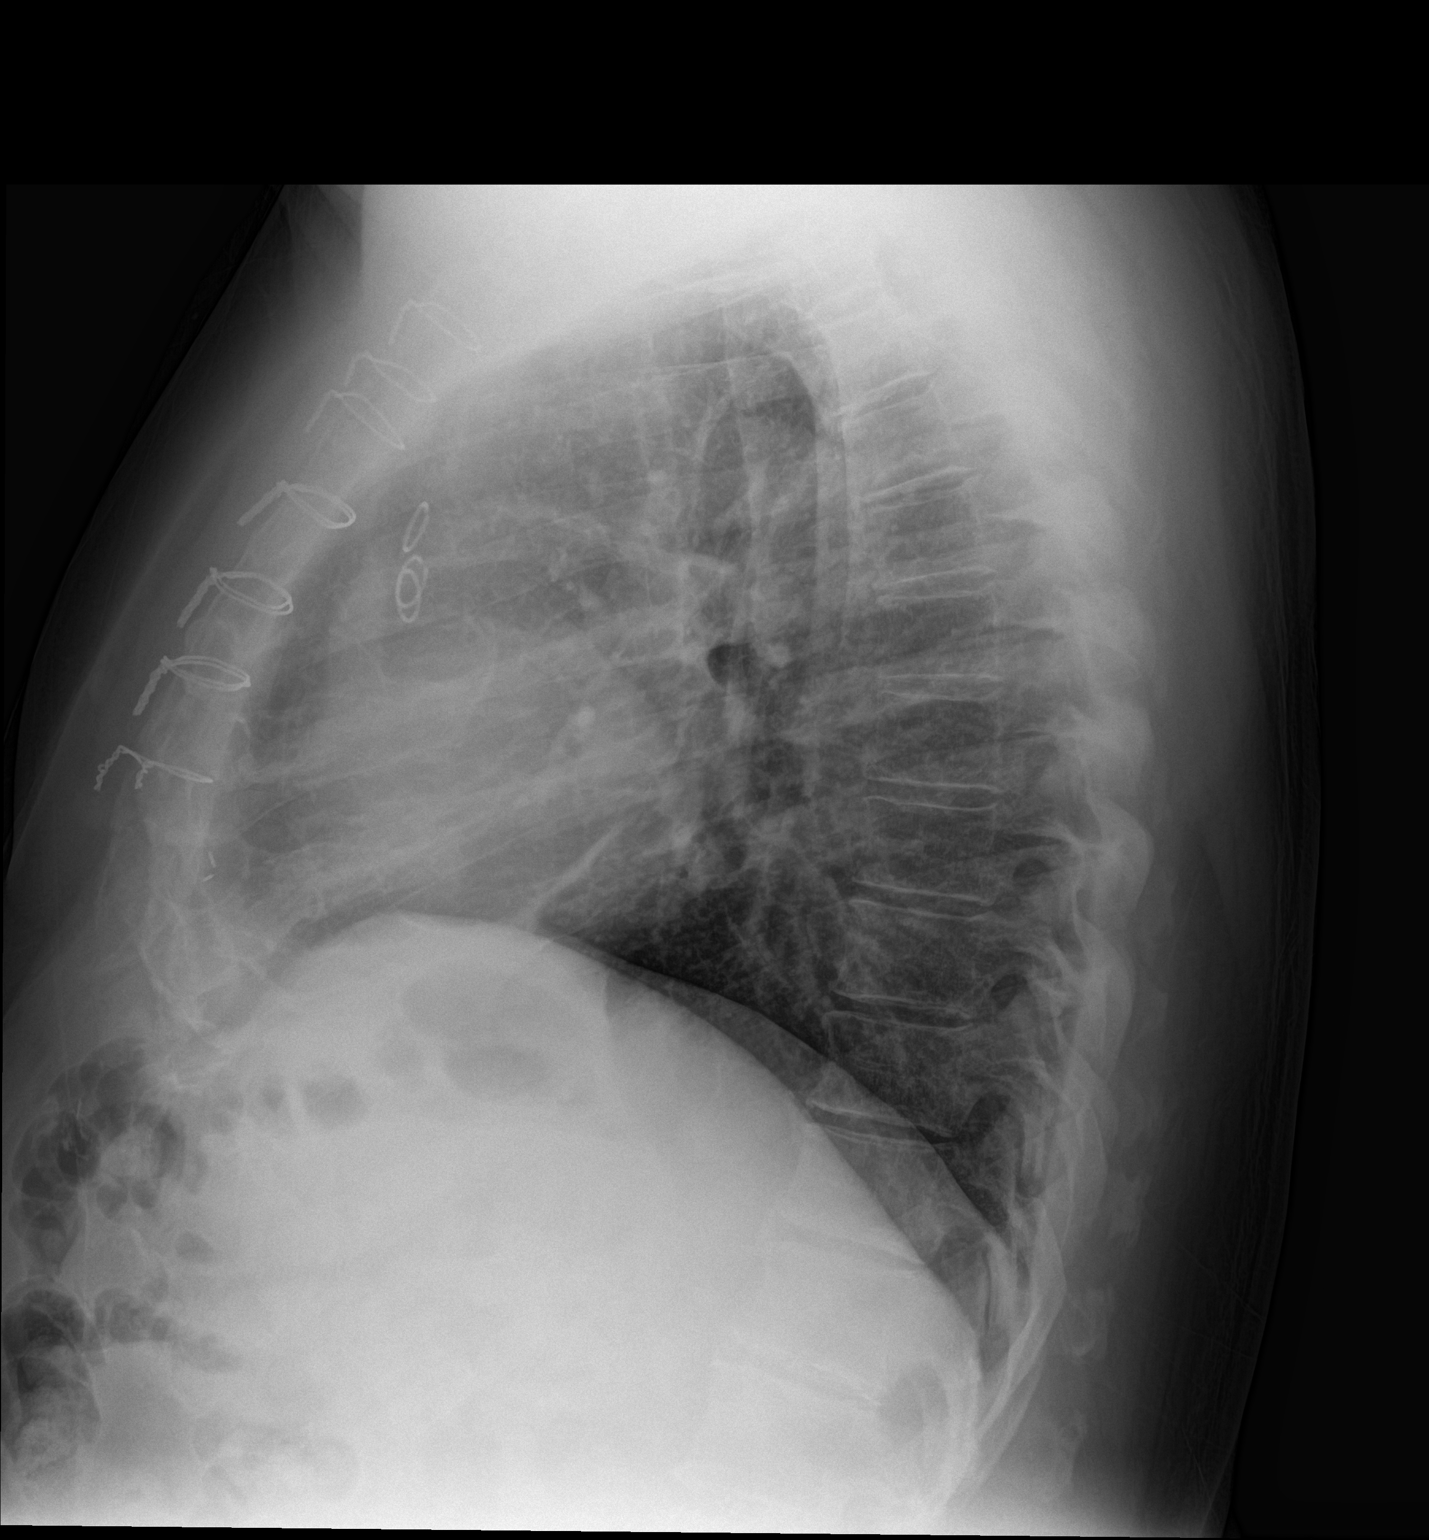

[2 of 2 positions shown; findings below may reference images not displayed]

FINDINGS: Lungs are clear. Heart size and pulmonary vascularity are normal. No
adenopathy. Patient is status post coronary artery bypass grafting.
No bone lesions.
IMPRESSION: No edema or consolidation.

## 2018-02-20 NOTE — Progress Notes (Signed)
Patient: [name], Male    DOB: [birth year], 63 y.o.   MRN: [id number] Visit Date: 02/21/2018  Today's Provider: Vernie Murders, PA   Chief Complaint  Patient presents with  . Annual Exam   Subjective:     Annual physical exam [name] is a 63 y.o. male who presents today for health maintenance and complete physical. He feels well. He reports exercising none. He reports he is sleeping well.  ---------------------------------------------------------------   Review of Systems  All other systems reviewed and are negative.   Social History      He  reports that he quit smoking about 5 years ago. His smoking use included cigarettes. He has a 55.50 pack-year smoking history. He has never used smokeless tobacco. He reports that he does not drink alcohol or use drugs.       Social History   Socioeconomic History  . Marital status: Married    Spouse name: Not on file  . Number of children: Not on file  . Years of education: Not on file  . Highest education level: Not on file  Occupational History  . Occupation: retired  Scientific laboratory technician  . Financial resource strain: Not on file  . Food insecurity:    Worry: Not on file    Inability: Not on file  . Transportation needs:    Medical: Not on file    Non-medical: Not on file  Tobacco Use  . Smoking status: Former Smoker    Packs/day: 1.50    Years: 37.00    Pack years: 55.50    Types: Cigarettes    Last attempt to quit: 06/05/2012    Years since quitting: 5.7  . Smokeless tobacco: Never Used  Substance and Sexual Activity  . Alcohol use: No  . Drug use: No  . Sexual activity: Not on file  Lifestyle  . Physical activity:    Days per week: Not on file    Minutes per session: Not on file  . Stress: Not on file  Relationships  . Social connections:    Talks on phone: Not on file    Gets together: Not on file    Attends religious service: Not on file    Active member of club or organization: Not on file    Attends meetings of clubs or organizations: Not on file    Relationship status: Not on file  Other Topics Concern  . Not on file  Social History Narrative  . Not on file    Past Medical History:  Diagnosis Date  . Allergy   . Anginal pain ([place])   . Colon polyp 2008   Colonoscopy due to repeat in 2011 due to polyps  . Coronary artery disease   . Essential hypertension, benign 06/05/2012  . GERD (gastroesophageal reflux disease)   . History of herpes zoster   . Hypercholesterolemia   . Hyperlipidemia 06/05/2012  . Hypertension   . Left main coronary artery disease 06/05/2012  . Myocardial infarction ([place])   . Obesity (BMI 30-39.9) 06/05/2012  . Obstructive sleep apnea 06/05/2012  . Sleep apnea   . Smoker   . Tobacco abuse 06/05/2012  . Vitamin D deficiency    Patient Active Problem List   Diagnosis Date Noted  . Allergic rhinitis 07/25/2014  . Allergic state 07/25/2014  . Chest pain 07/25/2014  . Abdominal discomfort, epigastric 07/25/2014  . Essential (primary) hypertension 07/25/2014  . Genital herpes 07/25/2014  . 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  H/O coronary artery bypass surgery 07/25/2014  . Personal history of infectious and parasitic disease 07/25/2014  . Hypercholesteremia 07/25/2014  . Blood glucose elevated 07/25/2014  . BP (high blood pressure) 07/25/2014  . Personal history of urinary calculi 07/25/2014  . Cervical pain 07/25/2014  . Adiposity 07/25/2014  . Hypercholesterolemia without hypertriglyceridemia 07/25/2014  . Avitaminosis D 07/25/2014  . Hypertensive urgency 07/19/2014  . Left main coronary artery disease 06/05/2012  . Coronary artery disease 06/05/2012  . Essential hypertension, benign 06/05/2012  . Tobacco abuse 06/05/2012  . Hyperlipidemia 06/05/2012  . Obesity (BMI 30-39.9) 06/05/2012  . Obstructive sleep apnea 06/05/2012  . GERD (gastroesophageal reflux disease) 06/05/2012   Past Surgical History:  Procedure Laterality Date  . CARDIAC  CATHETERIZATION    . COLONOSCOPY WITH PROPOFOL N/A 06/10/2015   Procedure: COLONOSCOPY WITH PROPOFOL;  Surgeon: Lollie Sails, MD;  Location: [facility] ENDOSCOPY;  Service: Endoscopy;  Laterality: N/A;  . CORONARY ARTERY BYPASS GRAFT N/A 06/06/2012   Procedure: CORONARY ARTERY BYPASS GRAFTING (CABG);  Surgeon: Grace Isaac, MD;  Location: [place];  Service: Open Heart Surgery;  Laterality: N/A;  x4 using right greater saphenous vein and left internal mammary.   . ENDOVEIN HARVEST OF GREATER SAPHENOUS VEIN Right 06/06/2012   Procedure: ENDOVEIN HARVEST OF GREATER SAPHENOUS VEIN;  Surgeon: Grace Isaac, MD;  Location: [place];  Service: Open Heart Surgery;  Laterality: Right;  . ESOPHAGOGASTRODUODENOSCOPY (EGD) WITH PROPOFOL N/A 06/10/2015   Procedure: ESOPHAGOGASTRODUODENOSCOPY (EGD) WITH PROPOFOL;  Surgeon: Lollie Sails, MD;  Location: [facility] ENDOSCOPY;  Service: Endoscopy;  Laterality: N/A;  . INTRAOPERATIVE TRANSESOPHAGEAL ECHOCARDIOGRAM N/A 06/06/2012   Procedure: INTRAOPERATIVE TRANSESOPHAGEAL ECHOCARDIOGRAM;  Surgeon: Grace Isaac, MD;  Location: [place];  Service: Open Heart Surgery;  Laterality: N/A;  . LITHOTRIPSY  01/11/2008  . TONSILLECTOMY     Family History        Family Status  Relation Name Status  . Mother  Alive  . Father MI Deceased  . Brother  Alive  . Other  (Not Specified)        His family history includes Hypertension in an other family member.     Allergies  Allergen Reactions  . Sulfa Antibiotics     Other reaction(s): Unknown "Makes him feel worse then better when taken"per wife    Current Outpatient Medications:  .  aspirin EC 325 MG EC tablet, Take 1 tablet (325 mg total) by mouth daily., Disp: , Rfl:  .  cetirizine (ZYRTEC) 10 MG tablet, Take 10 mg by mouth every evening., Disp: , Rfl:  .  cholecalciferol (VITAMIN D) 1000 UNITS tablet, Take 2,000 Units by mouth every evening., Disp: , Rfl:  .  CRESTOR 20 MG tablet, TAKE 1 TABLET BY MOUTH EVERY DAY,  Disp: 30 tablet, Rfl: 6 .  fluticasone (FLONASE) 50 MCG/ACT nasal spray, SPRAY TWICE IN EACH NOSTRIL ONCE DAILY, Disp: , Rfl: 12 .  losartan-hydrochlorothiazide (HYZAAR) 100-12.5 MG per tablet, , Disp: , Rfl: 10 .  metoprolol succinate (TOPROL-XL) 50 MG 24 hr tablet, TAKE 1 TABLET (50 MG TOTAL) BY MOUTH ONCE DAILY., Disp: , Rfl: 11 .  pantoprazole (PROTONIX) 40 MG tablet, TAKE 1 TABLET (40 MG TOTAL) BY MOUTH ONCE DAILY. ONE HOUR BEFORE A MEAL., Disp: , Rfl:  .  acetaminophen (TYLENOL) 325 MG tablet, Take 2 tablets (650 mg total) by mouth every 6 (six) hours as needed for mild pain (or Fever >/= 101). (Patient not taking: Reported on 02/21/2018), Disp: , Rfl:  Patient Care Team: Chrismon, Vickki Muff, PA as PCP - General (Physician Assistant) Teodoro Spray, MD as Attending Physician (Cardiology)   Objective:    Vitals: BP 102/78 (BP Location: Right Arm, Patient Position: Sitting, Cuff Size: Large)   Pulse (!) 56   Temp 97.9 F (36.6 C) (Oral)   Resp 16   Ht 5\' 7"  (1.702 m)   Wt 226 lb (102.5 kg)   SpO2 94%   BMI 35.40 kg/m    Wt Readings from Last 3 Encounters:  02/21/18 226 lb (102.5 kg)  10/31/15 213 lb 3.2 oz (96.7 kg)  06/10/15 210 lb (95.3 kg)   Vitals:   02/21/18 0904  BP: 102/78  Pulse: (!) 56  Resp: 16  Temp: 97.9 F (36.6 C)  TempSrc: Oral  SpO2: 94%  Weight: 226 lb (102.5 kg)  Height: 5\' 7"  (1.702 m)    Physical Exam Constitutional:      General: He is not in acute distress.    Appearance: He is well-developed.  HENT:     Head: Normocephalic and atraumatic.     Right Ear: Hearing and external ear normal.     Left Ear: Hearing and external ear normal.     Nose: Nose normal.  Eyes:     General: Lids are normal. No scleral icterus.       Right eye: No discharge.        Left eye: No discharge.     Conjunctiva/sclera: Conjunctivae normal.     Pupils: Pupils are equal, round, and reactive to light.  Neck:     Musculoskeletal: Normal range of motion and neck  supple.     Thyroid: No thyromegaly.     Trachea: No tracheal deviation.  Cardiovascular:     Rate and Rhythm: Normal rate and regular rhythm.     Heart sounds: Normal heart sounds. No murmur.  Pulmonary:     Effort: Pulmonary effort is normal. No respiratory distress.     Breath sounds: Normal breath sounds. No wheezing or rales.  Chest:     Chest wall: No tenderness.  Abdominal:     General: There is no distension.     Palpations: Abdomen is soft. There is no mass.     Tenderness: There is no abdominal tenderness. There is no guarding or rebound.  Genitourinary:    Penis: Normal.      Rectum: Normal. Guaiac result negative.     Comments: Questionably enlarged prostate. Thickened pink rash with pruritis and pinpoint raw spots on scrotum. Musculoskeletal: Normal range of motion.        General: No tenderness.  Lymphadenopathy:     Cervical: No cervical adenopathy.  Skin:    General: Skin is warm and dry.     Findings: No erythema, lesion or rash.  Neurological:     Mental Status: He is alert and oriented to person, place, and time.     Cranial Nerves: No cranial nerve deficit.     Motor: No abnormal muscle tone.     Coordination: Coordination normal.     Deep Tendon Reflexes: Reflexes are normal and symmetric. Reflexes normal.  Psychiatric:        Speech: Speech normal.        Behavior: Behavior normal.        Thought Content: Thought content normal.        Judgment: Judgment normal.     Depression Screen PHQ 2/9 Scores 02/21/2018  PHQ - 2 Score 0  PHQ- 9 Score 0     Assessment & Plan:     Routine Health Maintenance and Physical Exam  Exercise Activities and Dietary recommendations Goals   Run up and down steps at work but difficult to schedule regular workout time due to erratic work schedule.    Immunization History  Administered Date(s) Administered  . Tdap 12/21/2011    Health Maintenance  Topic Date Due  . Hepatitis C Screening  1955-09-24  . HIV  Screening  11/16/1969  . INFLUENZA VACCINE  08/04/2017  . TETANUS/TDAP  12/20/2021  . COLONOSCOPY  06/09/2025     Discussed health benefits of physical activity, and encouraged him to engage in regular exercise appropriate for his age and condition.    -------------------------------------------------------------------- 1. Annual physical exam General health stable. Feeling well. Last colonoscopy and EGD was 06-10-15 by Dr. Gustavo Lah (GI). Counseled regarding Shingrix and Flu shot (he will check with his insurance regarding coverage of Shingrix - declines influenza vaccination today). Given anticipatory counseling.   2. Essential hypertension, benign Well controlled by Hyzaar 100-12.5 mg qd and Metoprolol Succinate 50 mg qd. Continue to restrict salt in diet and work on weight loss. Recheck CBC, CMP, TSH and Lipid Panel.  - CBC with Differential/Platelet - Comprehensive metabolic panel - Lipid panel - TSH  3. Coronary artery disease involving coronary bypass graft of native heart without angina pectoris Followed by Dr. Ubaldo Glassing (cardiologist) with CAD status post CABG in 2014, LIMA to LAD, SVG to first intermediate, SVG to second intermediate, SVG to distal RCA. The patient reports that he is doing well from a cardiac standpoint. He denies chest pain, shortness of breath, or palpitations. Continues Metoprolol Succinate 50 mg qd, ASA-EC 325 mg qd and Crestor 20 mg qd. Recheck routine labs and follow up pending reports.  - CBC with Differential/Platelet - Comprehensive metabolic panel - Lipid panel  4. Gastroesophageal reflux disease with esophagitis Feeling well without hematochezia, melena or hematemesis. Heart burn well controlled by use of Pantoprazole 40 mg qd as recommended by Dr. Gustavo Lah (had EGD that showed LA Grade B esophagitis, erosive gastritis and duodenitis on 06-10-15). Recheck CBC and continue follow up with Dr. Gustavo Lah every 1-2 years. - CBC with Differential/Platelet  5. Mixed  hyperlipidemia Tolerating the Crestor 20 mg qd without side effects. Recheck labs and continue low fat diet. Need to lose some weight - has gained 5 lbs in the past 9 months. - Comprehensive metabolic panel - Lipid panel - TSH  6. Dermatitis Long term scrotal rash with itching. Area is 3 x 4.5 cm on the lower anterior scrotum with pinpoint scabs from scratching. Area is pink and thickened. Will treat with Triamcinolone cream and Zeasorb-AF powder BID. Recheck if no better in 10-14 days.  - CBC with Differential/Platelet - triamcinolone cream (KENALOG) 0.1 %; Apply 1 application topically 2 (two) times daily.  Dispense: 30 g; Refill: 0  7. Screening for HIV (human immunodeficiency virus) - HIV Antibody (routine testing w rflx)  8. Need for hepatitis C screening test - Hepatitis C antibody  9. Enlarged prostate Some frequency of urination with decreased stream over the past several months. No hematuria or urinary retention. Occasionally some urge incontinence/leakage. Wears pads/diapers when traveling a lot at work. Suspect BPH. Will get PSA test to rule our prostate CA. May need Flomax if symptoms persist. - PSA    Vernie Murders, PA  [facility]

## 2018-02-21 ENCOUNTER — Encounter: Payer: Self-pay | Admitting: Family Medicine

## 2018-02-21 ENCOUNTER — Ambulatory Visit (INDEPENDENT_AMBULATORY_CARE_PROVIDER_SITE_OTHER): Payer: 59 | Admitting: Family Medicine

## 2018-02-21 VITALS — BP 102/78 | HR 56 | Temp 97.9°F | Resp 16 | Ht 67.0 in | Wt 226.0 lb

## 2018-02-21 DIAGNOSIS — Z1159 Encounter for screening for other viral diseases: Secondary | ICD-10-CM

## 2018-02-21 DIAGNOSIS — K2100 Gastro-esophageal reflux disease with esophagitis, without bleeding: Secondary | ICD-10-CM

## 2018-02-21 DIAGNOSIS — N400 Enlarged prostate without lower urinary tract symptoms: Secondary | ICD-10-CM

## 2018-02-21 DIAGNOSIS — I25810 Atherosclerosis of coronary artery bypass graft(s) without angina pectoris: Secondary | ICD-10-CM | POA: Diagnosis not present

## 2018-02-21 DIAGNOSIS — E782 Mixed hyperlipidemia: Secondary | ICD-10-CM

## 2018-02-21 DIAGNOSIS — I10 Essential (primary) hypertension: Secondary | ICD-10-CM | POA: Diagnosis not present

## 2018-02-21 DIAGNOSIS — Z0000 Encounter for general adult medical examination without abnormal findings: Secondary | ICD-10-CM

## 2018-02-21 DIAGNOSIS — L309 Dermatitis, unspecified: Secondary | ICD-10-CM

## 2018-02-21 DIAGNOSIS — Z114 Encounter for screening for human immunodeficiency virus [HIV]: Secondary | ICD-10-CM

## 2018-02-21 MED ORDER — TRIAMCINOLONE ACETONIDE 0.1 % EX CREA: 1.0000 "application " | TOPICAL_CREAM | Freq: Two times a day (BID) | CUTANEOUS | 0 refills | Status: DC

## 2018-02-22 LAB — COMPREHENSIVE METABOLIC PANEL
ALBUMIN: 4.4 g/dL (ref 3.8–4.8)
ALT: 23 IU/L (ref 0–44)
AST: 21 IU/L (ref 0–40)
Albumin/Globulin Ratio: 1.7 (ref 1.2–2.2)
Alkaline Phosphatase: 77 IU/L (ref 39–117)
BUN/Creatinine Ratio: 17 (ref 10–24)
BUN: 19 mg/dL (ref 8–27)
Bilirubin Total: 1 mg/dL (ref 0.0–1.2)
CALCIUM: 9.4 mg/dL (ref 8.6–10.2)
CO2: 24 mmol/L (ref 20–29)
CREATININE: 1.15 mg/dL (ref 0.76–1.27)
Chloride: 99 mmol/L (ref 96–106)
GFR calc Af Amer: 78 mL/min/{1.73_m2} (ref >59)
GFR, EST NON AFRICAN AMERICAN: 67 mL/min/{1.73_m2} (ref >59)
GLOBULIN, TOTAL: 2.6 g/dL (ref 1.5–4.5)
Glucose: 97 mg/dL (ref 65–99)
Potassium: 3.9 mmol/L (ref 3.5–5.2)
Sodium: 139 mmol/L (ref 134–144)
TOTAL PROTEIN: 7 g/dL (ref 6.0–8.5)

## 2018-02-22 LAB — CBC WITH DIFFERENTIAL/PLATELET
BASOS: 0 %
Basophils Absolute: 0 10*3/uL (ref 0.0–0.2)
EOS (ABSOLUTE): 0.2 10*3/uL (ref 0.0–0.4)
EOS: 2 %
HEMATOCRIT: 49.5 % (ref 37.5–51.0)
Hemoglobin: 17.5 g/dL (ref 13.0–17.7)
IMMATURE GRANULOCYTES: 0 %
Immature Grans (Abs): 0 10*3/uL (ref 0.0–0.1)
LYMPHS ABS: 2.2 10*3/uL (ref 0.7–3.1)
Lymphs: 27 %
MCH: 31.5 pg (ref 26.6–33.0)
MCHC: 35.4 g/dL (ref 31.5–35.7)
MCV: 89 fL (ref 79–97)
Monocytes Absolute: 0.5 10*3/uL (ref 0.1–0.9)
Monocytes: 6 %
NEUTROS PCT: 65 %
Neutrophils Absolute: 5.3 10*3/uL (ref 1.4–7.0)
Platelets: 201 10*3/uL (ref 150–450)
RBC: 5.56 x10E6/uL (ref 4.14–5.80)
RDW: 12.5 % (ref 11.6–15.4)
WBC: 8.2 10*3/uL (ref 3.4–10.8)

## 2018-02-22 LAB — HEPATITIS C ANTIBODY: Hep C Virus Ab: <0.1 s/co ratio (ref 0.0–0.9)

## 2018-02-22 LAB — LIPID PANEL
CHOLESTEROL TOTAL: 156 mg/dL (ref 100–199)
Chol/HDL Ratio: 3.6 ratio (ref 0.0–5.0)
HDL: 43 mg/dL (ref >39)
LDL Calculated: 86 mg/dL (ref 0–99)
Triglycerides: 133 mg/dL (ref 0–149)
VLDL Cholesterol Cal: 27 mg/dL (ref 5–40)

## 2018-02-22 LAB — PSA: Prostate Specific Ag, Serum: 2.6 ng/mL (ref 0.0–4.0)

## 2018-02-22 LAB — TSH: TSH: 1.48 u[IU]/mL (ref 0.450–4.500)

## 2018-02-22 LAB — HIV ANTIBODY (ROUTINE TESTING W REFLEX): HIV Screen 4th Generation wRfx: NONREACTIVE

## 2018-02-24 ENCOUNTER — Telehealth: Payer: Self-pay | Admitting: *Deleted

## 2018-02-24 MED ORDER — TAMSULOSIN HCL 0.4 MG PO CAPS: 0.4000 mg | ORAL_CAPSULE | Freq: Every day | ORAL | 0 refills | Status: DC

## 2018-02-24 NOTE — Telephone Encounter (Signed)
-----   Message from [place], [place] sent at 02/23/2018  1:05 PM EST ----- All blood tests normal. Suspect urinary symptoms are due to prostate enlargement. No indication of prostate cancer. May try Flomax 0.4 mg qd #30 & 3 RF to see if this helps control. Many time need to consider Finasteride with this to help shrink gland. Recheck progress in 3-4 weeks.

## 2018-02-24 NOTE — Telephone Encounter (Signed)
Patient was notified of results. Expressed understanding. Rx sent to pharmacy. 

## 2018-03-20 ENCOUNTER — Encounter: Payer: Self-pay | Admitting: Family Medicine

## 2018-03-20 ENCOUNTER — Other Ambulatory Visit: Payer: Self-pay

## 2018-03-20 ENCOUNTER — Ambulatory Visit: Payer: 59 | Admitting: Family Medicine

## 2018-03-20 VITALS — BP 102/60 | HR 61 | Temp 98.0°F | Resp 16 | Wt 228.0 lb

## 2018-03-20 DIAGNOSIS — R351 Nocturia: Secondary | ICD-10-CM

## 2018-03-20 DIAGNOSIS — N401 Enlarged prostate with lower urinary tract symptoms: Secondary | ICD-10-CM | POA: Diagnosis not present

## 2018-03-20 DIAGNOSIS — I10 Essential (primary) hypertension: Secondary | ICD-10-CM

## 2018-03-20 MED ORDER — FINASTERIDE 5 MG PO TABS: 5.0000 mg | ORAL_TABLET | Freq: Every day | ORAL | 1 refills | Status: DC

## 2018-03-20 NOTE — Progress Notes (Signed)
Patient: [name] Male    DOB: 06-09-1955   63 y.o.   MRN: [id number] Visit Date: 03/20/2018  Today's Provider: Vernie Murders, PA   Chief Complaint  Patient presents with  . Follow-up   Subjective:     HPI   Enlarged prostate From 02/21/2018-labs checked. Given rx for Flomax 0.4 mg qd #30 & 3 RF to see if this helps control. May need to consider Finasteride with this to help shrink gland. Recheck progress in 3-4 weeks.  Patient stated he is doing well on Flomax.  Past Medical History:  Diagnosis Date  . Allergy   . Anginal pain ([place])   . Colon polyp 2008   Colonoscopy due to repeat in 2011 due to polyps  . Coronary artery disease   . Essential hypertension, benign 06/05/2012  . GERD (gastroesophageal reflux disease)   . History of herpes zoster   . Hypercholesterolemia   . Hyperlipidemia 06/05/2012  . Hypertension   . Left main coronary artery disease 06/05/2012  . Myocardial infarction ([place])   . Obesity (BMI 30-39.9) 06/05/2012  . Obstructive sleep apnea 06/05/2012  . Sleep apnea   . Smoker   . Tobacco abuse 06/05/2012  . Vitamin D deficiency    Past Surgical History:  Procedure Laterality Date  . CARDIAC CATHETERIZATION    . COLONOSCOPY WITH PROPOFOL N/A 06/10/2015   Procedure: COLONOSCOPY WITH PROPOFOL;  Surgeon: Lollie Sails, MD;  [facility] ENDOSCOPY;  Service: Endoscopy;  Laterality: N/A;  . CORONARY ARTERY BYPASS GRAFT N/A 06/06/2012   Procedure: CORONARY ARTERY BYPASS GRAFTING (CABG);  Surgeon: Grace Isaac, MD;  Location: [place];  Service: Open Heart Surgery;  Laterality: N/A;  x4 using right greater saphenous vein and left internal mammary.   . ENDOVEIN HARVEST OF GREATER SAPHENOUS VEIN Right 06/06/2012   Procedure: ENDOVEIN HARVEST OF GREATER SAPHENOUS VEIN;  Surgeon: Grace Isaac, MD;  Location: [place];  Service: Open Heart Surgery;  Laterality: Right;  . ESOPHAGOGASTRODUODENOSCOPY (EGD) WITH PROPOFOL N/A 06/10/2015   Procedure:  ESOPHAGOGASTRODUODENOSCOPY (EGD) WITH PROPOFOL;  Surgeon: Lollie Sails, MD;  Location[facility] ENDOSCOPY;  Service: Endoscopy;  Laterality: N/A;  . INTRAOPERATIVE TRANSESOPHAGEAL ECHOCARDIOGRAM N/A 06/06/2012   Procedure: INTRAOPERATIVE TRANSESOPHAGEAL ECHOCARDIOGRAM;  Surgeon: Grace Isaac, MD;  Location: [place];  Service: Open Heart Surgery;  Laterality: N/A;  . LITHOTRIPSY  01/11/2008  . TONSILLECTOMY     Family History  Problem Relation Age of Onset  . Hypertension Other    Allergies  Allergen Reactions  . Sulfa Antibiotics     Other reaction(s): Unknown "Makes him feel worse then better when taken"per wife    Current Outpatient Medications:  .  acetaminophen (TYLENOL) 325 MG tablet, Take 2 tablets (650 mg total) by mouth every 6 (six) hours as needed for mild pain (or Fever >/= 101)., Disp: , Rfl:  .  aspirin EC 325 MG EC tablet, Take 1 tablet (325 mg total) by mouth daily., Disp: , Rfl:  .  cetirizine (ZYRTEC) 10 MG tablet, Take 10 mg by mouth every evening., Disp: , Rfl:  .  cholecalciferol (VITAMIN D) 1000 UNITS tablet, Take 2,000 Units by mouth every evening., Disp: , Rfl:  .  CRESTOR 20 MG tablet, TAKE 1 TABLET BY MOUTH EVERY DAY, Disp: 30 tablet, Rfl: 6 .  fluticasone (FLONASE) 50 MCG/ACT nasal spray, SPRAY TWICE IN EACH NOSTRIL ONCE DAILY, Disp: , Rfl: 12 .  losartan-hydrochlorothiazide (HYZAAR) 100-12.5 MG per tablet, ,  Disp: , Rfl: 10 .  metoprolol succinate (TOPROL-XL) 50 MG 24 hr tablet, TAKE 1 TABLET (50 MG TOTAL) BY MOUTH ONCE DAILY., Disp: , Rfl: 11 .  pantoprazole (PROTONIX) 40 MG tablet, TAKE 1 TABLET (40 MG TOTAL) BY MOUTH ONCE DAILY. ONE HOUR BEFORE A MEAL., Disp: , Rfl:  .  tamsulosin (FLOMAX) 0.4 MG CAPS capsule, Take 1 capsule (0.4 mg total) by mouth daily., Disp: 90 capsule, Rfl: 0 .  triamcinolone cream (KENALOG) 0.1 %, Apply 1 application topically 2 (two) times daily., Disp: 30 g, Rfl: 0  Review of Systems  Constitutional: Negative for appetite change,  chills and fever.  Respiratory: Negative for chest tightness, shortness of breath and wheezing.   Cardiovascular: Negative for chest pain and palpitations.  Gastrointestinal: Negative for abdominal pain, nausea and vomiting.   Social History   Tobacco Use  . Smoking status: Former Smoker    Packs/day: 1.50    Years: 37.00    Pack years: 55.50    Types: Cigarettes    Last attempt to quit: 06/05/2012    Years since quitting: 5.7  . Smokeless tobacco: Never Used  Substance Use Topics  . Alcohol use: No     Objective:   BP 102/60 (BP Location: Right Arm, Patient Position: Sitting, Cuff Size: Large)   Pulse 61   Temp 98 F (36.7 C) (Oral)   Resp 16   Wt 228 lb (103.4 kg)   SpO2 96%   BMI 35.71 kg/m    BP Readings from Last 3 Encounters:  03/20/18 102/60  02/21/18 102/78  10/31/15 118/82   Vitals:   03/20/18 0851  BP: 102/60  Pulse: 61  Resp: 16  Temp: 98 F (36.7 C)  TempSrc: Oral  SpO2: 96%  Weight: 228 lb (103.4 kg)   Physical Exam Constitutional:      General: He is not in acute distress.    Appearance: He is well-developed.  HENT:     Head: Normocephalic and atraumatic.     Right Ear: Hearing normal.     Left Ear: Hearing normal.     Nose: Nose normal.  Eyes:     General: Lids are normal. No scleral icterus.       Right eye: No discharge.        Left eye: No discharge.     Conjunctiva/sclera: Conjunctivae normal.  Neck:     Musculoskeletal: Neck supple.  Cardiovascular:     Rate and Rhythm: Normal rate and regular rhythm.     Pulses: Normal pulses.     Heart sounds: Normal heart sounds.  Pulmonary:     Effort: Pulmonary effort is normal. No respiratory distress.  Abdominal:     General: Bowel sounds are normal.     Palpations: Abdomen is soft.  Musculoskeletal: Normal range of motion.  Skin:    Findings: No lesion or rash.  Neurological:     Mental Status: He is alert and oriented to person, place, and time.  Psychiatric:        Speech:  Speech normal.        Behavior: Behavior normal.        Thought Content: Thought content normal.       Assessment & Plan    1. BPH associated with nocturia Enlarged prostate with decreased stream, frequency and PSA 2.6 on 02-21-18. Responded quickly to Flomax ).4 mg qd and ready to start the Finasteride. Will continue Flomax and recheck in 6 months to see if we can  taper off the Flomax. - finasteride (PROSCAR) 5 MG tablet; Take 1 tablet (5 mg total) by mouth daily.  Dispense: 90 tablet; Refill: 1  2. Essential hypertension, benign BP well controlled on the Hyzaar 100-25 mg qd and Metoprolol Succinate 50 mg qd. CMP Latest Ref Rng & Units 02/21/2018 11/07/2015 05/09/2015  Glucose 65 - 99 mg/dL 97 100(H) 91  BUN 8 - 27 mg/dL 19 18 17   Creatinine 0.76 - 1.27 mg/dL 1.15 1.15 1.10  Sodium 134 - 144 mmol/L 139 142 140  Potassium 3.5 - 5.2 mmol/L 3.9 3.9 3.8  Chloride 96 - 106 mmol/L 99 99 97  CO2 20 - 29 mmol/L 24 26 26   Calcium 8.6 - 10.2 mg/dL 9.4 9.5 9.6  Total Protein 6.0 - 8.5 g/dL 7.0 6.8 7.1  Total Bilirubin 0.0 - 1.2 mg/dL 1.0 1.1 1.1  Alkaline Phos 39 - 117 IU/L 77 68 69  AST 0 - 40 IU/L 21 24 24   ALT 0 - 44 IU/L 23 20 25       Vernie Murders, PA  [facility]

## 2018-04-27 ENCOUNTER — Other Ambulatory Visit: Payer: Self-pay | Admitting: Family Medicine

## 2018-07-10 ENCOUNTER — Encounter: Payer: Self-pay | Admitting: Family Medicine

## 2018-07-10 ENCOUNTER — Ambulatory Visit: Payer: 59 | Admitting: Family Medicine

## 2018-07-10 ENCOUNTER — Other Ambulatory Visit: Payer: Self-pay

## 2018-07-10 VITALS — BP 118/84 | HR 68 | Temp 98.2°F | Resp 18 | Wt 225.6 lb

## 2018-07-10 DIAGNOSIS — M545 Low back pain, unspecified: Secondary | ICD-10-CM

## 2018-07-10 LAB — POCT URINALYSIS DIPSTICK
Bilirubin, UA: NEGATIVE
Glucose, UA: NEGATIVE
Ketones, UA: NEGATIVE
Leukocytes, UA: NEGATIVE
Nitrite, UA: NEGATIVE
Protein, UA: POSITIVE — AB
Spec Grav, UA: 1.02 (ref 1.010–1.025)
Urobilinogen, UA: 1 E.U./dL
pH, UA: 6 (ref 5.0–8.0)

## 2018-07-10 NOTE — Progress Notes (Signed)
Patient: [name] Male    DOB: May 27, 1955   63 y.o.   MRN: [id number] Visit Date: 07/10/2018  Today's Provider: Vernie Murders, PA   Chief Complaint  Patient presents with  . Back Pain   Subjective:     HPI  Patient states that he is having lower back pain that has been on and off for the last two weeks. Back pain has gotten worse over night with pain starting in the flank area and moving down the right thigh. He also experienced some nausea.   Past Medical History:  Diagnosis Date  . Allergy   . Anginal pain ([place])   . Colon polyp 2008   Colonoscopy due to repeat in 2011 due to polyps  . Coronary artery disease   . Essential hypertension, benign 06/05/2012  . GERD (gastroesophageal reflux disease)   . History of herpes zoster   . Hypercholesterolemia   . Hyperlipidemia 06/05/2012  . Hypertension   . Left main coronary artery disease 06/05/2012  . Myocardial infarction ([place])   . Obesity (BMI 30-39.9) 06/05/2012  . Obstructive sleep apnea 06/05/2012  . Sleep apnea   . Smoker   . Tobacco abuse 06/05/2012  . Vitamin D deficiency    Past Surgical History:  Procedure Laterality Date  . CARDIAC CATHETERIZATION    . COLONOSCOPY WITH PROPOFOL N/A 06/10/2015   Procedure: COLONOSCOPY WITH PROPOFOL;  Surgeon: Lollie Sails, MD;  Location: [facility] ENDOSCOPY;  Service: Endoscopy;  Laterality: N/A;  . CORONARY ARTERY BYPASS GRAFT N/A 06/06/2012   Procedure: CORONARY ARTERY BYPASS GRAFTING (CABG);  Surgeon: Grace Isaac, MD;  Location: [place];  Service: Open Heart Surgery;  Laterality: N/A;  x4 using right greater saphenous vein and left internal mammary.   . ENDOVEIN HARVEST OF GREATER SAPHENOUS VEIN Right 06/06/2012   Procedure: ENDOVEIN HARVEST OF GREATER SAPHENOUS VEIN;  Surgeon: Grace Isaac, MD;  Location: [place];  Service: Open Heart Surgery;  Laterality: Right;  . ESOPHAGOGASTRODUODENOSCOPY (EGD) WITH PROPOFOL N/A 06/10/2015   Procedure: ESOPHAGOGASTRODUODENOSCOPY (EGD)  WITH PROPOFOL;  Surgeon: Lollie Sails, MD;  Location: [facility] ENDOSCOPY;  Service: Endoscopy;  Laterality: N/A;  . INTRAOPERATIVE TRANSESOPHAGEAL ECHOCARDIOGRAM N/A 06/06/2012   Procedure: INTRAOPERATIVE TRANSESOPHAGEAL ECHOCARDIOGRAM;  Surgeon: Grace Isaac, MD;  Location: [place];  Service: Open Heart Surgery;  Laterality: N/A;  . LITHOTRIPSY  01/11/2008  . TONSILLECTOMY     Family History  Problem Relation Age of Onset  . Hypertension Other    Allergies  Allergen Reactions  . Sulfa Antibiotics     Other reaction(s): Unknown "Makes him feel worse then better when taken"per wife    Current Outpatient Medications:  .  aspirin EC 325 MG EC tablet, Take 1 tablet (325 mg total) by mouth daily., Disp: , Rfl:  .  cetirizine (ZYRTEC) 10 MG tablet, Take 10 mg by mouth every evening., Disp: , Rfl:  .  cholecalciferol (VITAMIN D) 1000 UNITS tablet, Take 2,000 Units by mouth every evening., Disp: , Rfl:  .  CRESTOR 20 MG tablet, TAKE 1 TABLET BY MOUTH EVERY DAY, Disp: 30 tablet, Rfl: 6 .  fluticasone (FLONASE) 50 MCG/ACT nasal spray, SPRAY TWICE IN EACH NOSTRIL ONCE DAILY, Disp: , Rfl: 12 .  losartan-hydrochlorothiazide (HYZAAR) 100-12.5 MG per tablet, , Disp: , Rfl: 10 .  metoprolol succinate (TOPROL-XL) 50 MG 24 hr tablet, TAKE 1 TABLET (50 MG TOTAL) BY MOUTH ONCE DAILY., Disp: , Rfl: 11 .  pantoprazole (PROTONIX) 40  MG tablet, TAKE 1 TABLET (40 MG TOTAL) BY MOUTH ONCE DAILY. ONE HOUR BEFORE A MEAL., Disp: , Rfl:  .  tamsulosin (FLOMAX) 0.4 MG CAPS capsule, TAKE 1 CAPSULE BY MOUTH  DAILY, Disp: 90 capsule, Rfl: 1 .  acetaminophen (TYLENOL) 325 MG tablet, Take 2 tablets (650 mg total) by mouth every 6 (six) hours as needed for mild pain (or Fever >/= 101). (Patient not taking: Reported on 07/10/2018), Disp: , Rfl:  .  finasteride (PROSCAR) 5 MG tablet, Take 1 tablet (5 mg total) by mouth daily. (Patient not taking: Reported on 07/10/2018), Disp: 90 tablet, Rfl: 1 .  triamcinolone cream (KENALOG)  0.1 %, Apply 1 application topically 2 (two) times daily. (Patient not taking: Reported on 07/10/2018), Disp: 30 g, Rfl: 0  Review of Systems  Gastrointestinal: Positive for nausea.  Musculoskeletal: Positive for back pain.  All other systems reviewed and are negative.  Social History   Tobacco Use  . Smoking status: Former Smoker    Packs/day: 1.50    Years: 37.00    Pack years: 55.50    Types: Cigarettes    Quit date: 06/05/2012    Years since quitting: 6.0  . Smokeless tobacco: Never Used  Substance Use Topics  . Alcohol use: No     Objective:   BP 118/84 (BP Location: Right Arm, Patient Position: Sitting, Cuff Size: Large)   Pulse 68   Temp 98.2 F (36.8 C) (Oral)   Resp 18   Wt 225 lb 9.6 oz (102.3 kg)   SpO2 98%   BMI 35.33 kg/m  Vitals:   07/10/18 1116  BP: 118/84  Pulse: 68  Resp: 18  Temp: 98.2 F (36.8 C)  TempSrc: Oral  SpO2: 98%  Weight: 225 lb 9.6 oz (102.3 kg)   Physical Exam Constitutional:      General: He is not in acute distress.    Appearance: He is well-developed.  HENT:     Head: Normocephalic and atraumatic.     Right Ear: Hearing normal.     Left Ear: Hearing normal.     Nose: Nose normal.  Eyes:     General: Lids are normal. No scleral icterus.       Right eye: No discharge.        Left eye: No discharge.     Conjunctiva/sclera: Conjunctivae normal.  Cardiovascular:     Rate and Rhythm: Normal rate and regular rhythm.     Heart sounds: Normal heart sounds.  Pulmonary:     Effort: Pulmonary effort is normal. No respiratory distress.     Breath sounds: Normal breath sounds.  Abdominal:     General: Bowel sounds are normal.     Palpations: Abdomen is soft.     Tenderness: There is no abdominal tenderness. There is no right CVA tenderness or left CVA tenderness.  Musculoskeletal: Normal range of motion.     Comments: Only mild discomfort in the right lower lumbar musculature. No pain with test of ROM or SLR's to 90 degrees  bilaterally. No radiation or numbness.  Skin:    Findings: No lesion or rash.  Neurological:     Mental Status: He is alert and oriented to person, place, and time.  Psychiatric:        Speech: Speech normal.        Behavior: Behavior normal.        Thought Content: Thought content normal.       Assessment & Plan  1. Right-sided low back pain without sciatica, unspecified chronicity Has had intermittent right low back pain the past 2 weeks. Was sitting in his car for 8 hours yesterday and had a persistent recurrence with some radiation to the right posterior thigh. States he feels this is the same pain he has had with kidney stones in the past (had lithotripsy in 2010). Urinalysis negative for RBC's, WBC's or crystals per hpf. Suspect muscular strain with restarting his "fast" walking exercise. May apply moist heat and should stretch before and after walking exercise. Increase water intake to flush out urinary tract and limit tea intake. Recheck prn. No pain at the present. - POCT urinalysis dipstick     Vernie Murders, PA  [facility]

## 2018-09-21 ENCOUNTER — Ambulatory Visit: Payer: Self-pay | Admitting: Family Medicine

## 2018-12-23 ENCOUNTER — Other Ambulatory Visit: Payer: Self-pay | Admitting: Family Medicine

## 2019-05-14 NOTE — Progress Notes (Signed)
Complete physical exam     I,Elena D DeSanto,acting as a scribe for [employer], PA.,have documented all relevant documentation on the behalf of Vernie Murders, PA,as directed by  [employer], PA while in the presence of [employer], [place].  Patient: [name]   DOB: 1955/10/29   64 y.o. Male  MRN: [id number] Visit Date: 05/17/2019  Today's healthcare provider: Vernie Murders, PA   Chief Complaint  Patient presents with  . Annual Exam   Subjective    [name] is a 64 y.o. male who presents today for a complete physical exam.  He reports consuming a general diet. He is not exercising He generally feels well. He reports sleeping well. He does have additional problems to discuss today. He would like for you to look at and freeze a skin tag he has on his left ear.    Past Medical History:  Diagnosis Date  . Allergy   . Anginal pain ([place])   . Colon polyp 2008   Colonoscopy due to repeat in 2011 due to polyps  . Coronary artery disease   . Essential hypertension, benign 06/05/2012  . GERD (gastroesophageal reflux disease)   . History of herpes zoster   . Hypercholesterolemia   . Hyperlipidemia 06/05/2012  . Hypertension   . Left main coronary artery disease 06/05/2012  . Myocardial infarction ([place])   . Obesity (BMI 30-39.9) 06/05/2012  . Obstructive sleep apnea 06/05/2012  . Sleep apnea   . Smoker   . Tobacco abuse 06/05/2012  . Vitamin D deficiency    Past Surgical History:  Procedure Laterality Date  . CARDIAC CATHETERIZATION    . COLONOSCOPY WITH PROPOFOL N/A 06/10/2015   Procedure: COLONOSCOPY WITH PROPOFOL;  Surgeon: Lollie Sails, MD;  Location: [facility] ENDOSCOPY;  Service: Endoscopy;  Laterality: N/A;  . CORONARY ARTERY BYPASS GRAFT N/A 06/06/2012   Procedure: CORONARY ARTERY BYPASS GRAFTING (CABG);  Surgeon: Grace Isaac, MD;  Location: [place];  Service: Open Heart Surgery;  Laterality: N/A;  x4 using right greater saphenous vein and left internal  mammary.   . ENDOVEIN HARVEST OF GREATER SAPHENOUS VEIN Right 06/06/2012   Procedure: ENDOVEIN HARVEST OF GREATER SAPHENOUS VEIN;  Surgeon: Grace Isaac, MD;  Location: [place];  Service: Open Heart Surgery;  Laterality: Right;  . ESOPHAGOGASTRODUODENOSCOPY (EGD) WITH PROPOFOL N/A 06/10/2015   Procedure: ESOPHAGOGASTRODUODENOSCOPY (EGD) WITH PROPOFOL;  Surgeon: Lollie Sails, MD;  Location: [facility] ENDOSCOPY;  Service: Endoscopy;  Laterality: N/A;  . INTRAOPERATIVE TRANSESOPHAGEAL ECHOCARDIOGRAM N/A 06/06/2012   Procedure: INTRAOPERATIVE TRANSESOPHAGEAL ECHOCARDIOGRAM;  Surgeon: Grace Isaac, MD;  Location: [place];  Service: Open Heart Surgery;  Laterality: N/A;  . LITHOTRIPSY  01/11/2008  . TONSILLECTOMY     Social History   Socioeconomic History  . Marital status: Married    Spouse name: Not on file  . Number of children: Not on file  . Years of education: Not on file  . Highest education level: Not on file  Occupational History  . Occupation: retired  Tobacco Use  . Smoking status: Former Smoker    Packs/day: 1.50    Years: 37.00    Pack years: 55.50    Types: Cigarettes    Quit date: 06/05/2012    Years since quitting: 6.9  . Smokeless tobacco: Never Used  Substance and Sexual Activity  . Alcohol use: No  . Drug use: No  . Sexual activity: Not on file  Other Topics Concern  . Not on  file  Social History Narrative  . Not on file   Social Determinants of Health   Financial Resource Strain:   . Difficulty of Paying Living Expenses:   Food Insecurity:   . Worried About Charity fundraiser in the Last Year:   . Arboriculturist in the Last Year:   Transportation Needs:   . Film/video editor (Medical):   Marland Kitchen Lack of Transportation (Non-Medical):   Physical Activity:   . Days of Exercise per Week:   . Minutes of Exercise per Session:   Stress:   . Feeling of Stress :   Social Connections:   . Frequency of Communication with Friends and Family:   . Frequency of  Social Gatherings with Friends and Family:   . Attends Religious Services:   . Active Member of Clubs or Organizations:   . Attends Archivist Meetings:   Marland Kitchen Marital Status:   Intimate Partner Violence:   . Fear of Current or Ex-Partner:   . Emotionally Abused:   Marland Kitchen Physically Abused:   . Sexually Abused:    Family Status  Relation Name Status  . Mother  Alive  . Father MI Deceased  . Brother  Alive  . Other  (Not Specified)   Family History  Problem Relation Age of Onset  . Hypertension Other    Allergies  Allergen Reactions  . Sulfa Antibiotics     Other reaction(s): Unknown "Makes him feel worse then better when taken"per wife    Patient Care Team: Derek Huneycutt, Vickki Muff, PA as PCP - General (Physician Assistant) Teodoro Spray, MD as Attending Physician (Cardiology)   Medications: Outpatient Medications Prior to Visit  Medication Sig  . aspirin EC 325 MG EC tablet Take 1 tablet (325 mg total) by mouth daily.  . cetirizine (ZYRTEC) 10 MG tablet Take 10 mg by mouth every evening.  . cholecalciferol (VITAMIN D) 1000 UNITS tablet Take 2,000 Units by mouth every evening.  Marland Kitchen CRESTOR 20 MG tablet TAKE 1 TABLET BY MOUTH EVERY DAY  . fluticasone (FLONASE) 50 MCG/ACT nasal spray SPRAY TWICE IN EACH NOSTRIL ONCE DAILY  . losartan-hydrochlorothiazide (HYZAAR) 100-12.5 MG per tablet   . metoprolol succinate (TOPROL-XL) 50 MG 24 hr tablet TAKE 1 TABLET (50 MG TOTAL) BY MOUTH ONCE DAILY.  . pantoprazole (PROTONIX) 40 MG tablet TAKE 1 TABLET (40 MG TOTAL) BY MOUTH ONCE DAILY. ONE HOUR BEFORE A MEAL.  . tamsulosin (FLOMAX) 0.4 MG CAPS capsule TAKE 1 CAPSULE BY MOUTH  DAILY  . [DISCONTINUED] acetaminophen (TYLENOL) 325 MG tablet Take 2 tablets (650 mg total) by mouth every 6 (six) hours as needed for mild pain (or Fever >/= 101). (Patient not taking: Reported on 07/10/2018)  . [DISCONTINUED] finasteride (PROSCAR) 5 MG tablet Take 1 tablet (5 mg total) by mouth daily. (Patient not  taking: Reported on 07/10/2018)  . [DISCONTINUED] triamcinolone cream (KENALOG) 0.1 % Apply 1 application topically 2 (two) times daily. (Patient not taking: Reported on 07/10/2018)   No facility-administered medications prior to visit.    Review of Systems  Constitutional: Negative.   HENT: Negative.   Eyes: Negative.   Respiratory: Negative.   Cardiovascular: Negative.   Gastrointestinal: Negative.   Endocrine: Negative.   Genitourinary: Negative.   Musculoskeletal: Negative.   Skin: Negative.   Allergic/Immunologic: Negative.   Neurological: Negative.   Hematological: Negative.   Psychiatric/Behavioral: Negative.      Objective    BP 124/80 (BP Location: Left Arm, Patient Position:  Sitting, Cuff Size: Normal)   Pulse 63   Temp (!) 96.9 F (36.1 C) (Skin)   Wt 225 lb (102.1 kg)   SpO2 99%   BMI 35.24 kg/m  Wt Readings from Last 3 Encounters:  05/17/19 225 lb (102.1 kg)  07/10/18 225 lb 9.6 oz (102.3 kg)  03/20/18 228 lb (103.4 kg)   Physical Exam Constitutional:      Appearance: Normal appearance. He is well-developed and normal weight.  HENT:     Head: Normocephalic and atraumatic.     Right Ear: Tympanic membrane, ear canal and external ear normal.     Left Ear: Tympanic membrane, ear canal and external ear normal.     Nose: Nose normal.     Mouth/Throat:     Mouth: Mucous membranes are moist.     Pharynx: Oropharynx is clear.  Eyes:     General:        Right eye: No discharge.     Extraocular Movements: Extraocular movements intact.     Conjunctiva/sclera: Conjunctivae normal.     Pupils: Pupils are equal, round, and reactive to light.  Neck:     Thyroid: No thyromegaly.     Trachea: No tracheal deviation.  Cardiovascular:     Rate and Rhythm: Normal rate and regular rhythm.     Pulses: Normal pulses.     Heart sounds: Normal heart sounds. No murmur.  Pulmonary:     Effort: Pulmonary effort is normal. No respiratory distress.     Breath sounds: Normal  breath sounds. No wheezing or rales.  Chest:     Chest wall: No tenderness.  Abdominal:     General: Abdomen is flat. Bowel sounds are normal. There is no distension.     Palpations: Abdomen is soft. There is no mass.     Tenderness: There is no abdominal tenderness. There is no guarding or rebound.  Genitourinary:    Penis: Normal.      Testes: Normal.     Prostate: Normal.     Rectum: Normal. Guaiac result negative.  Musculoskeletal:        General: No tenderness. Normal range of motion.     Cervical back: Normal range of motion and neck supple.  Lymphadenopathy:     Cervical: No cervical adenopathy.  Skin:    General: Skin is warm and dry.     Findings: No erythema or rash.  Neurological:     General: No focal deficit present.     Mental Status: He is alert and oriented to person, place, and time. Mental status is at baseline.     Cranial Nerves: No cranial nerve deficit.     Motor: No abnormal muscle tone.     Coordination: Coordination normal.     Deep Tendon Reflexes: Reflexes are normal and symmetric. Reflexes normal.  Psychiatric:        Mood and Affect: Mood normal.        Behavior: Behavior normal.        Thought Content: Thought content normal.        Judgment: Judgment normal.    Depression Screen  PHQ 2/9 Scores 05/17/2019 02/21/2018  PHQ - 2 Score 0 0  PHQ- 9 Score 0 0   Fall Risk  05/17/2019  Falls in the past year? 0    Functional Status Survey: Is the patient deaf or have difficulty hearing?: No Does the patient have difficulty seeing, even when wearing glasses/contacts?: No Does the patient have difficulty  concentrating, remembering, or making decisions?: No Does the patient have difficulty walking or climbing stairs?: No Does the patient have difficulty dressing or bathing?: No Does the patient have difficulty doing errands alone such as visiting a doctor's office or shopping?: No     Office Visit from 05/17/2019 in [facility]  AUDIT-C  Score  0      Current Exercise Habits: The patient does not participate in regular exercise at present    Home Exercise  05/17/2019  Current Exercise Habits The patient does not participate in regular exercise at present     Assessment & Plan    Routine Health Maintenance and Physical Exam  Exercise Activities and Dietary recommendations Goals   No regular exercise program. Recommend low fat diet and work on weight loss.     Immunization History  Administered Date(s) Administered  . Tdap 12/21/2011    Health Maintenance  Topic Date Due  . COVID-19 Vaccine (1) Never done  . INFLUENZA VACCINE  08/05/2019  . TETANUS/TDAP  12/20/2021  . COLONOSCOPY  06/09/2025  . Hepatitis C Screening  Completed  . HIV Screening  Completed    Discussed health benefits of physical activity, and encouraged him to engage in regular exercise appropriate for his age and condition.  1. Annual physical exam General health stable. Has had COVID vaccination. Given anticipatory counseling and recheck routine labs. - CBC with Differential/Platelet - Comprehensive metabolic panel - Lipid Panel With LDL/HDL Ratio - TSH  2. Essential (primary) hypertension Well controlled by Hyzaar 100-12.5 mg qd and Metoprolol Succinate 50 mg qd. Continue to restrict salt in diet and work on weight loss. Recheck CBC, CMP, TSH and Lipid Panel. - CBC with Differential/Platelet - Comprehensive metabolic panel - TSH  3. H/O coronary artery bypass surgery Followed by Dr. Ubaldo Glassing (cardiologist) with CAD status post CABG in 2014, LIMA to LAD, SVG to first intermediate, SVG to second intermediate, SVG to distal RCA. The patient reports that he is doing well from a cardiac standpoint. He denies chest pain, shortness of breath, or palpitations. Continues Metoprolol Succinate 50 mg qd, ASA-EC 325 mg qd and Crestor 20 mg qd  4. Hypercholesterolemia without hypertriglyceridemia Continues to tolerate the Crestor 20 mg qd without  side effects. Recheck follow up labs. Encouraged to follow low fat diet and continue to work on weight loss. - Comprehensive metabolic panel - Lipid Panel With LDL/HDL Ratio - TSH  5. Obstructive sleep apnea Use CPAP prn.  6. Obesity (BMI 30-39.9) BMI over 35 but weight stable the past year. Will check follow up labs and counseled regarding diet and exercise. - CBC with Differential/Platelet - Comprehensive metabolic panel - Lipid Panel With LDL/HDL Ratio - TSH  7. BPH associated with nocturia Nocturia improved with use of Flomax daily. Did not continue the Proscar to shrink the prostate size. DRE does not indicate significant enlargement or nodules today. Still using pads for urge incontinence/leakage when traveling a lot at work.. - PSA   No follow-ups on file.     Andres Shad, PA, have reviewed all documentation for this visit. The documentation on 05/18/19 for the exam, diagnosis, procedures, and orders are all accurate and complete.    Vernie Murders, [place] [phone] (phone) [phone] (fax)  [place]

## 2019-05-17 ENCOUNTER — Other Ambulatory Visit: Payer: Self-pay

## 2019-05-17 ENCOUNTER — Ambulatory Visit (INDEPENDENT_AMBULATORY_CARE_PROVIDER_SITE_OTHER): Payer: 59 | Admitting: Family Medicine

## 2019-05-17 VITALS — BP 124/80 | HR 63 | Temp 96.9°F | Wt 225.0 lb

## 2019-05-17 DIAGNOSIS — E7800 Pure hypercholesterolemia, unspecified: Secondary | ICD-10-CM

## 2019-05-17 DIAGNOSIS — I10 Essential (primary) hypertension: Secondary | ICD-10-CM | POA: Diagnosis not present

## 2019-05-17 DIAGNOSIS — R351 Nocturia: Secondary | ICD-10-CM

## 2019-05-17 DIAGNOSIS — G4733 Obstructive sleep apnea (adult) (pediatric): Secondary | ICD-10-CM

## 2019-05-17 DIAGNOSIS — Z125 Encounter for screening for malignant neoplasm of prostate: Secondary | ICD-10-CM

## 2019-05-17 DIAGNOSIS — Z0000 Encounter for general adult medical examination without abnormal findings: Secondary | ICD-10-CM

## 2019-05-17 DIAGNOSIS — N401 Enlarged prostate with lower urinary tract symptoms: Secondary | ICD-10-CM

## 2019-05-17 DIAGNOSIS — Z951 Presence of aortocoronary bypass graft: Secondary | ICD-10-CM | POA: Diagnosis not present

## 2019-05-17 DIAGNOSIS — E669 Obesity, unspecified: Secondary | ICD-10-CM

## 2019-05-18 ENCOUNTER — Telehealth: Payer: Self-pay

## 2019-05-18 ENCOUNTER — Encounter: Payer: Self-pay | Admitting: Family Medicine

## 2019-05-18 LAB — LIPID PANEL WITH LDL/HDL RATIO
Cholesterol, Total: 135 mg/dL (ref 100–199)
HDL: 43 mg/dL (ref >39)
LDL Chol Calc (NIH): 75 mg/dL (ref 0–99)
LDL/HDL Ratio: 1.7 ratio (ref 0.0–3.6)
Triglycerides: 90 mg/dL (ref 0–149)
VLDL Cholesterol Cal: 17 mg/dL (ref 5–40)

## 2019-05-18 LAB — COMPREHENSIVE METABOLIC PANEL
ALT: 21 IU/L (ref 0–44)
AST: 20 IU/L (ref 0–40)
Albumin/Globulin Ratio: 1.9 (ref 1.2–2.2)
Albumin: 4.5 g/dL (ref 3.8–4.8)
Alkaline Phosphatase: 79 IU/L (ref 39–117)
BUN/Creatinine Ratio: 18 (ref 10–24)
BUN: 22 mg/dL (ref 8–27)
Bilirubin Total: 1.1 mg/dL (ref 0.0–1.2)
CO2: 24 mmol/L (ref 20–29)
Calcium: 9.6 mg/dL (ref 8.6–10.2)
Chloride: 100 mmol/L (ref 96–106)
Creatinine, Ser: 1.21 mg/dL (ref 0.76–1.27)
GFR calc Af Amer: 73 mL/min/{1.73_m2} (ref >59)
GFR calc non Af Amer: 63 mL/min/{1.73_m2} (ref >59)
Globulin, Total: 2.4 g/dL (ref 1.5–4.5)
Glucose: 100 mg/dL — ABNORMAL HIGH (ref 65–99)
Potassium: 3.8 mmol/L (ref 3.5–5.2)
Sodium: 137 mmol/L (ref 134–144)
Total Protein: 6.9 g/dL (ref 6.0–8.5)

## 2019-05-18 LAB — CBC WITH DIFFERENTIAL/PLATELET
Basophils Absolute: 0 10*3/uL (ref 0.0–0.2)
Basos: 0 %
EOS (ABSOLUTE): 0.1 10*3/uL (ref 0.0–0.4)
Eos: 1 %
Hematocrit: 52.7 % — ABNORMAL HIGH (ref 37.5–51.0)
Hemoglobin: 17.9 g/dL — ABNORMAL HIGH (ref 13.0–17.7)
Immature Grans (Abs): 0 10*3/uL (ref 0.0–0.1)
Immature Granulocytes: 0 %
Lymphocytes Absolute: 2.1 10*3/uL (ref 0.7–3.1)
Lymphs: 28 %
MCH: 32 pg (ref 26.6–33.0)
MCHC: 34 g/dL (ref 31.5–35.7)
MCV: 94 fL (ref 79–97)
Monocytes Absolute: 0.5 10*3/uL (ref 0.1–0.9)
Monocytes: 6 %
Neutrophils Absolute: 4.8 10*3/uL (ref 1.4–7.0)
Neutrophils: 65 %
Platelets: 205 10*3/uL (ref 150–450)
RBC: 5.6 x10E6/uL (ref 4.14–5.80)
RDW: 12.6 % (ref 11.6–15.4)
WBC: 7.5 10*3/uL (ref 3.4–10.8)

## 2019-05-18 LAB — PSA: Prostate Specific Ag, Serum: 3.2 ng/mL (ref 0.0–4.0)

## 2019-05-18 LAB — TSH: TSH: 1.79 u[IU]/mL (ref 0.450–4.500)

## 2019-05-18 NOTE — Telephone Encounter (Signed)
Patient called, left VM to return the call to the office for instructions about lab results.

## 2019-05-18 NOTE — Telephone Encounter (Signed)
-----   Message from Margo Common, [place] sent at 05/18/2019  9:45 AM EDT ----- All blood tests normal. Continue present medication regimen and recheck in 6 months.

## 2019-05-18 NOTE — Telephone Encounter (Signed)
LMTCB, [facility] Triage Nurse may give patient results  

## 2019-05-18 NOTE — Telephone Encounter (Signed)
Pt given lab results per notes of Vernie Murders, PA on 05/18/19. Pt verbalized understanding.

## 2019-05-30 ENCOUNTER — Encounter: Payer: Self-pay | Admitting: Family Medicine

## 2019-05-31 ENCOUNTER — Other Ambulatory Visit: Payer: Self-pay | Admitting: Family Medicine

## 2019-05-31 DIAGNOSIS — N401 Enlarged prostate with lower urinary tract symptoms: Secondary | ICD-10-CM

## 2019-05-31 MED ORDER — FINASTERIDE 5 MG PO TABS: 5.0000 mg | ORAL_TABLET | Freq: Every day | ORAL | 3 refills | Status: DC

## 2019-06-03 ENCOUNTER — Other Ambulatory Visit: Payer: Self-pay | Admitting: Family Medicine

## 2019-06-06 ENCOUNTER — Other Ambulatory Visit: Payer: Self-pay | Admitting: Family Medicine

## 2019-06-06 DIAGNOSIS — R351 Nocturia: Secondary | ICD-10-CM

## 2019-06-06 DIAGNOSIS — N401 Enlarged prostate with lower urinary tract symptoms: Secondary | ICD-10-CM

## 2019-06-06 MED ORDER — FINASTERIDE 5 MG PO TABS: 5.0000 mg | ORAL_TABLET | Freq: Every day | ORAL | 3 refills | Status: DC

## 2020-01-09 ENCOUNTER — Other Ambulatory Visit: Payer: Self-pay

## 2020-01-09 ENCOUNTER — Other Ambulatory Visit
Admission: RE | Admit: 2020-01-09 | Discharge: 2020-01-09 | Disposition: A | Discharge status: Home / Self Care | Payer: PPO | Source: Ambulatory Visit | Attending: Gastroenterology | Admitting: Gastroenterology

## 2020-01-09 DIAGNOSIS — Z20822 Contact with and (suspected) exposure to covid-19: Secondary | ICD-10-CM | POA: Diagnosis not present

## 2020-01-09 DIAGNOSIS — Z01818 Encounter for other preprocedural examination: Secondary | ICD-10-CM | POA: Insufficient documentation

## 2020-01-09 LAB — SARS CORONAVIRUS 2 (TAT 6-24 HRS): SARS Coronavirus 2: NEGATIVE

## 2020-01-10 ENCOUNTER — Encounter: Payer: Self-pay | Admitting: *Deleted

## 2020-01-11 ENCOUNTER — Other Ambulatory Visit: Payer: Self-pay

## 2020-01-11 ENCOUNTER — Ambulatory Visit
Admission: RE | Admit: 2020-01-11 | Discharge: 2020-01-11 | Disposition: A | Discharge status: Home / Self Care | Payer: PPO | Attending: Gastroenterology | Admitting: Gastroenterology

## 2020-01-11 ENCOUNTER — Encounter: Payer: Self-pay | Admitting: *Deleted

## 2020-01-11 ENCOUNTER — Ambulatory Visit: Payer: PPO | Admitting: Certified Registered Nurse Anesthetist

## 2020-01-11 ENCOUNTER — Encounter
Admission: RE | Disposition: A | Discharge status: Home / Self Care | Payer: Self-pay | Source: Home / Self Care | Attending: Gastroenterology

## 2020-01-11 DIAGNOSIS — Z79899 Other long term (current) drug therapy: Secondary | ICD-10-CM | POA: Insufficient documentation

## 2020-01-11 DIAGNOSIS — K635 Polyp of colon: Secondary | ICD-10-CM | POA: Diagnosis not present

## 2020-01-11 DIAGNOSIS — D123 Benign neoplasm of transverse colon: Secondary | ICD-10-CM | POA: Insufficient documentation

## 2020-01-11 DIAGNOSIS — G4733 Obstructive sleep apnea (adult) (pediatric): Secondary | ICD-10-CM | POA: Diagnosis not present

## 2020-01-11 DIAGNOSIS — Z87442 Personal history of urinary calculi: Secondary | ICD-10-CM | POA: Insufficient documentation

## 2020-01-11 DIAGNOSIS — I2510 Atherosclerotic heart disease of native coronary artery without angina pectoris: Secondary | ICD-10-CM | POA: Diagnosis not present

## 2020-01-11 DIAGNOSIS — D122 Benign neoplasm of ascending colon: Secondary | ICD-10-CM | POA: Insufficient documentation

## 2020-01-11 DIAGNOSIS — Z882 Allergy status to sulfonamides status: Secondary | ICD-10-CM | POA: Diagnosis not present

## 2020-01-11 DIAGNOSIS — Z1211 Encounter for screening for malignant neoplasm of colon: Secondary | ICD-10-CM | POA: Diagnosis not present

## 2020-01-11 DIAGNOSIS — Z951 Presence of aortocoronary bypass graft: Secondary | ICD-10-CM | POA: Diagnosis not present

## 2020-01-11 DIAGNOSIS — K649 Unspecified hemorrhoids: Secondary | ICD-10-CM | POA: Diagnosis not present

## 2020-01-11 DIAGNOSIS — Z7982 Long term (current) use of aspirin: Secondary | ICD-10-CM | POA: Insufficient documentation

## 2020-01-11 DIAGNOSIS — I129 Hypertensive chronic kidney disease with stage 1 through stage 4 chronic kidney disease, or unspecified chronic kidney disease: Secondary | ICD-10-CM | POA: Diagnosis not present

## 2020-01-11 DIAGNOSIS — K640 First degree hemorrhoids: Secondary | ICD-10-CM | POA: Insufficient documentation

## 2020-01-11 DIAGNOSIS — K5730 Diverticulosis of large intestine without perforation or abscess without bleeding: Secondary | ICD-10-CM | POA: Insufficient documentation

## 2020-01-11 DIAGNOSIS — Z86010 Personal history of colonic polyps: Secondary | ICD-10-CM | POA: Diagnosis not present

## 2020-01-11 DIAGNOSIS — N189 Chronic kidney disease, unspecified: Secondary | ICD-10-CM | POA: Diagnosis not present

## 2020-01-11 DIAGNOSIS — E785 Hyperlipidemia, unspecified: Secondary | ICD-10-CM | POA: Diagnosis not present

## 2020-01-11 DIAGNOSIS — K5790 Diverticulosis of intestine, part unspecified, without perforation or abscess without bleeding: Secondary | ICD-10-CM | POA: Diagnosis not present

## 2020-01-11 HISTORY — PX: COLONOSCOPY: SHX5424

## 2020-01-11 HISTORY — DX: Chronic kidney disease, unspecified: N18.9

## 2020-01-11 HISTORY — DX: Anxiety disorder, unspecified: F41.9

## 2020-01-11 SURGERY — COLONOSCOPY
Anesthesia: General

## 2020-01-11 MED ORDER — PROPOFOL 10 MG/ML IV BOLUS
INTRAVENOUS | Status: AC
  Filled 2020-01-11: qty 20

## 2020-01-11 MED ORDER — PROPOFOL 500 MG/50ML IV EMUL
INTRAVENOUS | Status: DC | PRN
  Administered 2020-01-11: 125 ug/kg/min via INTRAVENOUS

## 2020-01-11 MED ORDER — LIDOCAINE HCL (CARDIAC) PF 100 MG/5ML IV SOSY
PREFILLED_SYRINGE | INTRAVENOUS | Status: DC | PRN
  Administered 2020-01-11: 50 mg via INTRAVENOUS

## 2020-01-11 MED ORDER — SODIUM CHLORIDE 0.9 % IV SOLN: INTRAVENOUS | Status: DC

## 2020-01-11 MED ORDER — PROPOFOL 500 MG/50ML IV EMUL
INTRAVENOUS | Status: AC
  Filled 2020-01-11: qty 50

## 2020-01-11 MED ORDER — PROPOFOL 10 MG/ML IV BOLUS
INTRAVENOUS | Status: DC | PRN
  Administered 2020-01-11: 50 mg via INTRAVENOUS

## 2020-01-11 NOTE — Anesthesia Postprocedure Evaluation (Signed)
Anesthesia Post Note  Patient: [name]  Procedure(s) Performed: COLONOSCOPY (N/A )  Patient location during evaluation: Endoscopy Anesthesia Type: General Level of consciousness: awake and alert Pain management: pain level controlled Vital Signs Assessment: post-procedure vital signs reviewed and stable Respiratory status: spontaneous breathing, nonlabored ventilation, respiratory function stable and patient connected to nasal cannula oxygen Cardiovascular status: blood pressure returned to baseline and stable Postop Assessment: no apparent nausea or vomiting Anesthetic complications: no   No complications documented.   Last Vitals:  Vitals:   01/11/20 1246 01/11/20 1256  BP: 127/79 131/77  Pulse: 70 63  Resp: 14 12  Temp:    SpO2: 98% 97%    Last Pain:  Vitals:   01/11/20 1256  TempSrc:   PainSc: 0-No pain                 Martha Clan

## 2020-01-11 NOTE — H&P (Signed)
Outpatient short stay form Pre-procedure 01/11/2020 11:58 AM Raylene Miyamoto MD, MPH  Primary Physician: PA [place]  Reason for visit:  Surveillance  History of present illness:   65 y/o gentleman with history of adenomatous polyps here for surveillance colonoscopy. No family history of GI malignancies. No blood thinners. No abdominal surgeries. No new GI symptoms.    Current Facility-Administered Medications:  .  0.9 %  sodium chloride infusion, , Intravenous, Continuous, Deloria Brassfield, Hilton Cork, MD, Last Rate: 20 mL/hr at 01/11/20 1124, New Bag at 01/11/20 1124  Medications Prior to Admission  Medication Sig Dispense Refill Last Dose  . CRESTOR 20 MG tablet TAKE 1 TABLET BY MOUTH EVERY DAY 30 tablet 6 01/10/2020  . fluticasone (FLONASE) 50 MCG/ACT nasal spray SPRAY TWICE IN EACH NOSTRIL ONCE DAILY  12 Past Week at Unknown time  . losartan-hydrochlorothiazide (HYZAAR) 100-12.5 MG per tablet   10 01/10/2020  . metoprolol succinate (TOPROL-XL) 50 MG 24 hr tablet TAKE 1 TABLET (50 MG TOTAL) BY MOUTH ONCE DAILY.  11 01/10/2020  . aspirin EC 325 MG EC tablet Take 1 tablet (325 mg total) by mouth daily.   01/09/2020  . cetirizine (ZYRTEC) 10 MG tablet Take 10 mg by mouth every evening.   01/09/2020  . cholecalciferol (VITAMIN D) 1000 UNITS tablet Take 2,000 Units by mouth every evening.   01/09/2020  . finasteride (PROSCAR) 5 MG tablet Take 1 tablet (5 mg total) by mouth daily. 90 tablet 3 01/09/2020  . pantoprazole (PROTONIX) 40 MG tablet TAKE 1 TABLET (40 MG TOTAL) BY MOUTH ONCE DAILY. ONE HOUR BEFORE A MEAL.   01/09/2020  . tamsulosin (FLOMAX) 0.4 MG CAPS capsule TAKE 1 CAPSULE BY MOUTH  DAILY 90 capsule 3 01/09/2020     Allergies  Allergen Reactions  . Sulfa Antibiotics     Other reaction(s): Unknown "Makes him feel worse then better when taken"per wife     Past Medical History:  Diagnosis Date  . Allergy   . Anginal pain ([place])   . Anxiety   . Chronic kidney disease    kidney stones  . Colon  polyp 2008   Colonoscopy due to repeat in 2011 due to polyps  . Coronary artery disease   . Essential hypertension, benign 06/05/2012  . GERD (gastroesophageal reflux disease)   . History of herpes zoster   . Hypercholesterolemia   . Hyperlipidemia 06/05/2012  . Hypertension   . Left main coronary artery disease 06/05/2012  . Myocardial infarction ([place])   . Obesity (BMI 30-39.9) 06/05/2012  . Obstructive sleep apnea 06/05/2012  . Sleep apnea   . Smoker   . Tobacco abuse 06/05/2012  . Vitamin D deficiency     Review of systems:  Otherwise negative.    Physical Exam  Gen: Alert, oriented. Appears stated age.  HEENT: PERRLA. Lungs: No respiratory distress CV: RRR Abd: soft, benign, no masses Ext: No edema    Planned procedures: Proceed with colonoscopy. The patient understands the nature of the planned procedure, indications, risks, alternatives and potential complications including but not limited to bleeding, infection, perforation, damage to internal organs and possible oversedation/side effects from anesthesia. The patient agrees and gives consent to proceed.  Please refer to procedure notes for findings, recommendations and patient disposition/instructions.     Raylene Miyamoto MD, MPH Gastroenterology 01/11/2020  11:58 AM

## 2020-01-11 NOTE — Transfer of Care (Signed)
Immediate Anesthesia Transfer of Care Note  Patient: [name]  Procedure(s) Performed: COLONOSCOPY (N/A )  Patient Location: PACU  Anesthesia Type:General  Level of Consciousness: awake, alert  and oriented  Airway & Oxygen Therapy: Patient Spontanous Breathing and Patient connected to nasal cannula oxygen  Post-op Assessment: Report given to RN and Post -op Vital signs reviewed and stable  Post vital signs: Reviewed and stable  Last Vitals:  Vitals Value Taken Time  BP    Temp    Pulse    Resp    SpO2      Last Pain:  Vitals:   01/11/20 1112  TempSrc: Temporal  PainSc: 0-No pain         Complications: No complications documented.

## 2020-01-11 NOTE — Op Note (Signed)
[facility] Gastroenterology Patient Name: [name] Procedure Date: 01/11/2020 11:53 AM MRN: [id number] Account #: [id number] Date of Birth: 1955-11-16 Admit Type: Outpatient Age: 65 Room: [facility] ENDO ROOM 3 Gender: Male Note Status: Finalized Procedure:             Colonoscopy Indications:           High risk colon cancer surveillance: Personal history                         of colonic polyps Providers:             Andrey Farmer MD, MD Medicines:             Monitored Anesthesia Care Complications:         No immediate complications. Estimated blood loss:                         Minimal. Procedure:             Pre-Anesthesia Assessment:                        - Prior to the procedure, a History and Physical was                         performed, and patient medications and allergies were                         reviewed. The patient is competent. The risks and                         benefits of the procedure and the sedation options and                         risks were discussed with the patient. All questions                         were answered and informed consent was obtained.                         Patient identification and proposed procedure were                         verified by the physician, the nurse, the anesthetist                         and the technician in the endoscopy suite. Mental                         Status Examination: alert and oriented. Airway                         Examination: normal oropharyngeal airway and neck                         mobility. Respiratory Examination: clear to                         auscultation. CV Examination: normal. Prophylactic  Antibiotics: The patient does not require prophylactic                         antibiotics. Prior Anticoagulants: The patient has                         taken no previous anticoagulant or antiplatelet                         agents. ASA Grade  Assessment: II - A patient with mild                         systemic disease. After reviewing the risks and                         benefits, the patient was deemed in satisfactory                         condition to undergo the procedure. The anesthesia                         plan was to use monitored anesthesia care (MAC).                         Immediately prior to administration of medications,                         the patient was re-assessed for adequacy to receive                         sedatives. The heart rate, respiratory rate, oxygen                         saturations, blood pressure, adequacy of pulmonary                         ventilation, and response to care were monitored                         throughout the procedure. The physical status of the                         patient was re-assessed after the procedure.                        After obtaining informed consent, the colonoscope was                         passed under direct vision. Throughout the procedure,                         the patient's blood pressure, pulse, and oxygen                         saturations were monitored continuously. The                         Colonoscope was introduced through the anus and  advanced to the the terminal ileum. The colonoscopy                         was performed without difficulty. The patient                         tolerated the procedure well. The quality of the bowel                         preparation was good. Findings:      The perianal and digital rectal examinations were normal.      The terminal ileum appeared normal.      A 7 mm polyp was found in the ascending colon. The polyp was sessile.       The polyp was removed with a cold snare. Resection and retrieval were       complete. Estimated blood loss was minimal.      A 3 mm polyp was found in the hepatic flexure. The polyp was sessile.       The polyp was removed with a cold  snare. Resection and retrieval were       complete. Estimated blood loss was minimal.      A 1 mm polyp was found in the transverse colon. The polyp was sessile.       The polyp was removed with a jumbo cold forceps. Resection and retrieval       were complete. Estimated blood loss was minimal.      A few small-mouthed diverticula were found in the sigmoid colon.      Internal hemorrhoids were found during retroflexion. The hemorrhoids       were Grade I (internal hemorrhoids that do not prolapse).      The exam was otherwise without abnormality on direct and retroflexion       views. Impression:            - The examined portion of the ileum was normal.                        - One 7 mm polyp in the ascending colon, removed with                         a cold snare. Resected and retrieved.                        - One 3 mm polyp at the hepatic flexure, removed with                         a cold snare. Resected and retrieved.                        - One 1 mm polyp in the transverse colon, removed with                         a jumbo cold forceps. Resected and retrieved.                        - Diverticulosis in the sigmoid colon.                        -  Internal hemorrhoids.                        - The examination was otherwise normal on direct and                         retroflexion views. Recommendation:        - Discharge patient to home.                        - Resume previous diet.                        - Continue present medications.                        - Await pathology results.                        - Repeat colonoscopy for surveillance based on                         pathology results.                        - Return to referring physician as previously                         scheduled. Procedure Code(s):     --- Professional ---                        [phone], Colonoscopy, flexible; with removal of                         tumor(s), polyp(s), or other lesion(s) by  snare                         technique                        [id number], 43, Colonoscopy, flexible; with biopsy, single                         or multiple Diagnosis Code(s):     --- Professional ---                        Z86.010, Personal history of colonic polyps                        K64.0, First degree hemorrhoids                        K63.5, Polyp of colon                        K57.30, Diverticulosis of large intestine without                         perforation or abscess without bleeding CPT copyright 2019 [employer]. All rights reserved. The codes documented in this report are preliminary and upon coder review may  be revised to meet current compliance requirements. Andrey Farmer MD, MD  01/11/2020 12:25:54 PM Number of Addenda: 0 Note Initiated On: 01/11/2020 11:53 AM Scope Withdrawal Time: 0 hours 13 minutes 44 seconds  Total Procedure Duration: 0 hours 17 minutes 13 seconds  Estimated Blood Loss:  Estimated blood loss was minimal.      [facility]

## 2020-01-11 NOTE — Interval H&P Note (Signed)
History and Physical Interval Note:  01/11/2020 12:00 PM  [name]  has presented today for surgery, with the diagnosis of PERSONAL HX.OF COLON POLYPS.  The various methods of treatment have been discussed with the patient and family. After consideration of risks, benefits and other options for treatment, the patient has consented to  Procedure(s): COLONOSCOPY (N/A) as a surgical intervention.  The patient's history has been reviewed, patient examined, no change in status, stable for surgery.  I have reviewed the patient's chart and labs.  Questions were answered to the patient's satisfaction.     Lesly Rubenstein  Ok to proceed with colonoscopy

## 2020-01-11 NOTE — Anesthesia Preprocedure Evaluation (Signed)
Anesthesia Evaluation  Patient identified by MRN, date of birth, ID band Patient awake    Reviewed: Allergy & Precautions, NPO status , Patient's Chart, lab work & pertinent test results  History of Anesthesia Complications Negative for: history of anesthetic complications  Airway Mallampati: II       Dental  (+) Dental Advidsory Given, Teeth Intact, Caps, Implants, Poor Dentition   Pulmonary neg shortness of breath, sleep apnea and Continuous Positive Airway Pressure Ventilation , neg COPD, neg recent URI, former smoker,           Cardiovascular hypertension, Pt. on medications and Pt. on home beta blockers (-) angina+ CAD, + Past MI and + CABG  (-) Cardiac Stents (-) dysrhythmias (-) Valvular Problems/Murmurs     Neuro/Psych PSYCHIATRIC DISORDERS Anxiety negative neurological ROS     GI/Hepatic Neg liver ROS, GERD  Medicated,  Endo/Other  negative endocrine ROS  Renal/GU Renal disease (Stones)negative Renal ROS     Musculoskeletal   Abdominal   Peds  Hematology negative hematology ROS (+)   Anesthesia Other Findings Past Medical History: No date: Allergy No date: Anginal pain ([place]) No date: Anxiety No date: Chronic kidney disease     Comment:  kidney stones 2008: Colon polyp     Comment:  Colonoscopy due to repeat in 2011 due to polyps No date: Coronary artery disease 06/05/2012: Essential hypertension, benign No date: GERD (gastroesophageal reflux disease) No date: History of herpes zoster No date: Hypercholesterolemia 06/05/2012: Hyperlipidemia No date: Hypertension 06/05/2012: Left main coronary artery disease No date: Myocardial infarction ([place]) 06/05/2012: Obesity (BMI 30-39.9) 06/05/2012: Obstructive sleep apnea No date: Sleep apnea No date: Smoker 06/05/2012: Tobacco abuse No date: Vitamin D deficiency   Reproductive/Obstetrics                             Anesthesia  Physical  Anesthesia Plan  ASA: III  Anesthesia Plan: General   Post-op Pain Management:    Induction: Intravenous  PONV Risk Score and Plan: 2 and Propofol infusion and TIVA  Airway Management Planned: Nasal Cannula and Natural Airway  Additional Equipment:   Intra-op Plan:   Post-operative Plan:   Informed Consent: I have reviewed the patients History and Physical, chart, labs and discussed the procedure including the risks, benefits and alternatives for the proposed anesthesia with the patient or authorized representative who has indicated his/her understanding and acceptance.       Plan Discussed with:   Anesthesia Plan Comments:         Anesthesia Quick Evaluation

## 2020-01-14 LAB — SURGICAL PATHOLOGY

## 2020-04-17 ENCOUNTER — Other Ambulatory Visit: Payer: Self-pay | Admitting: Family Medicine

## 2020-04-17 DIAGNOSIS — N401 Enlarged prostate with lower urinary tract symptoms: Secondary | ICD-10-CM

## 2020-04-21 ENCOUNTER — Encounter: Payer: Self-pay | Admitting: Emergency Medicine

## 2020-04-21 ENCOUNTER — Ambulatory Visit
Admission: EM | Admit: 2020-04-21 | Discharge: 2020-04-21 | Disposition: A | Discharge status: Home / Self Care | Payer: PPO | Attending: Emergency Medicine | Admitting: Emergency Medicine

## 2020-04-21 ENCOUNTER — Other Ambulatory Visit: Payer: Self-pay

## 2020-04-21 DIAGNOSIS — J069 Acute upper respiratory infection, unspecified: Secondary | ICD-10-CM | POA: Diagnosis not present

## 2020-04-21 DIAGNOSIS — Z1152 Encounter for screening for COVID-19: Secondary | ICD-10-CM | POA: Diagnosis not present

## 2020-04-21 MED ORDER — PROMETHAZINE-DM 6.25-15 MG/5ML PO SYRP: 5.0000 mL | ORAL_SOLUTION | Freq: Four times a day (QID) | ORAL | 0 refills | Status: AC | PRN

## 2020-04-21 NOTE — ED Provider Notes (Signed)
[name]    CSN: [id number] Arrival date & time: 04/21/20  4259      History   Chief Complaint Chief Complaint  Patient presents with  . Sore Throat  . Cough    HPI [name] is a 65 y.o. male.   Patient presents with 2 to 3-day history of nasal congestion, postnasal drip, sore throat, nonproductive cough, chest congestion.  He denies fever, chills, rash, shortness of breath, nausea, vomiting, or other symptoms.  Treatment attempted at home with Robitussin-DM.  His medical history includes hypertension, CAD, MI, CABG, CKD, obesity, OSA, GERD, HSV.  The history is provided by the patient and medical records.    Past Medical History:  Diagnosis Date  . Allergy   . Anginal pain ([place])   . Anxiety   . Chronic kidney disease    kidney stones  . Colon polyp 2008   Colonoscopy due to repeat in 2011 due to polyps  . Coronary artery disease   . Essential hypertension, benign 06/05/2012  . GERD (gastroesophageal reflux disease)   . History of herpes zoster   . Hypercholesterolemia   . Hyperlipidemia 06/05/2012  . Hypertension   . Left main coronary artery disease 06/05/2012  . Myocardial infarction ([place])   . Obesity (BMI 30-39.9) 06/05/2012  . Obstructive sleep apnea 06/05/2012  . Sleep apnea   . Smoker   . Tobacco abuse 06/05/2012  . Vitamin D deficiency     Patient Active Problem List   Diagnosis Date Noted  . Allergic rhinitis 07/25/2014  . Allergic state 07/25/2014  . Chest pain 07/25/2014  . Abdominal discomfort, epigastric 07/25/2014  . Essential (primary) hypertension 07/25/2014  . Genital herpes 07/25/2014  . 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 H/O coronary artery bypass surgery 07/25/2014  . Personal history of infectious and parasitic disease 07/25/2014  . Hypercholesteremia 07/25/2014  . Blood glucose elevated 07/25/2014  . BP (high blood pressure) 07/25/2014  . Personal history of urinary calculi 07/25/2014  . Cervical pain 07/25/2014  .  Adiposity 07/25/2014  . Hypercholesterolemia without hypertriglyceridemia 07/25/2014  . Avitaminosis D 07/25/2014  . Hypertensive urgency 07/19/2014  . Left main coronary artery disease 06/05/2012  . Coronary artery disease 06/05/2012  . Essential hypertension, benign 06/05/2012  . Tobacco abuse 06/05/2012  . Hyperlipidemia 06/05/2012  . Obesity (BMI 30-39.9) 06/05/2012  . Obstructive sleep apnea 06/05/2012  . GERD (gastroesophageal reflux disease) 06/05/2012    Past Surgical History:  Procedure Laterality Date  . BASAL CELL CARCINOMA EXCISION  1997  . CARDIAC CATHETERIZATION    . COLONOSCOPY N/A 01/11/2020   Procedure: COLONOSCOPY;  Surgeon: Lesly Rubenstein, MD;  Location: [facility] ENDOSCOPY;  Service: Endoscopy;  Laterality: N/A;  . COLONOSCOPY WITH PROPOFOL N/A 06/10/2015   Procedure: COLONOSCOPY WITH PROPOFOL;  Surgeon: Lollie Sails, MD;  Location: [facility] ENDOSCOPY;  Service: Endoscopy;  Laterality: N/A;  . CORONARY ARTERY BYPASS GRAFT N/A 06/06/2012   Procedure: CORONARY ARTERY BYPASS GRAFTING (CABG);  Surgeon: Grace Isaac, MD;  Location: [place];  Service: Open Heart Surgery;  Laterality: N/A;  x4 using right greater saphenous vein and left internal mammary.   . ENDOVEIN HARVEST OF GREATER SAPHENOUS VEIN Right 06/06/2012   Procedure: ENDOVEIN HARVEST OF GREATER SAPHENOUS VEIN;  Surgeon: Grace Isaac, MD;  Location: [place];  Service: Open Heart Surgery;  Laterality: Right;  . ESOPHAGOGASTRODUODENOSCOPY (EGD) WITH PROPOFOL N/A 06/10/2015   Procedure: ESOPHAGOGASTRODUODENOSCOPY (EGD) WITH PROPOFOL;  Surgeon: Lollie Sails, MD;  Location[facility]  ENDOSCOPY;  Service: Endoscopy;  Laterality: N/A;  . INTRAOPERATIVE TRANSESOPHAGEAL ECHOCARDIOGRAM N/A 06/06/2012   Procedure: INTRAOPERATIVE TRANSESOPHAGEAL ECHOCARDIOGRAM;  Surgeon: Grace Isaac, MD;  Location: [place];  Service: Open Heart Surgery;  Laterality: N/A;  . LITHOTRIPSY  01/11/2008  . TONSILLECTOMY         Home  Medications    Prior to Admission medications   Medication Sig Start Date End Date Taking? Authorizing Provider  promethazine-dextromethorphan (PROMETHAZINE-DM) 6.25-15 MG/5ML syrup Take 5 mLs by mouth 4 (four) times daily as needed for up to 5 days for cough. 04/21/20 04/26/20 Yes Sharion Balloon, NP  aspirin EC 325 MG EC tablet Take 1 tablet (325 mg total) by mouth daily. 06/10/12   Gold, Wilder Glade, PA-C  cetirizine (ZYRTEC) 10 MG tablet Take 10 mg by mouth every evening.    [provider]  cholecalciferol (VITAMIN D) 1000 UNITS tablet Take 2,000 Units by mouth every evening.    [provider]  CRESTOR 20 MG tablet TAKE 1 TABLET BY MOUTH EVERY DAY 11/04/14   Chrismon, Vickki Muff, PA-C  finasteride (PROSCAR) 5 MG tablet TAKE 1 TABLET BY MOUTH  DAILY 04/17/20   Chrismon, Vickki Muff, PA-C  fluticasone (FLONASE) 50 MCG/ACT nasal spray SPRAY TWICE IN EACH NOSTRIL ONCE DAILY 03/03/15   [provider]  losartan-hydrochlorothiazide (HYZAAR) 100-12.5 MG per tablet  07/23/14   [provider]  metoprolol succinate (TOPROL-XL) 50 MG 24 hr tablet TAKE 1 TABLET (50 MG TOTAL) BY MOUTH ONCE DAILY. 07/07/14   [provider]  pantoprazole (PROTONIX) 40 MG tablet TAKE 1 TABLET (40 MG TOTAL) BY MOUTH ONCE DAILY. ONE HOUR BEFORE A MEAL. 10/08/15   [provider]  tamsulosin (FLOMAX) 0.4 MG CAPS capsule TAKE 1 CAPSULE BY MOUTH  DAILY 06/03/19   Chrismon, Vickki Muff, PA-C    Family History Family History  Problem Relation Age of Onset  . Hypertension Other     Social History Social History   Tobacco Use  . Smoking status: Former Smoker    Packs/day: 1.50    Years: 37.00    Pack years: 55.50    Types: Cigarettes    Quit date: 06/05/2012    Years since quitting: 7.8  . Smokeless tobacco: Never Used  Vaping Use  . Vaping Use: Never used  Substance Use Topics  . Alcohol use: No  . Drug use: No     Allergies   Sulfa antibiotics   Review of Systems Review of  Systems  Constitutional: Negative for chills and fever.  HENT: Positive for congestion, postnasal drip and sore throat. Negative for ear pain.   Eyes: Negative for pain and visual disturbance.  Respiratory: Positive for cough. Negative for shortness of breath.   Cardiovascular: Negative for chest pain and palpitations.  Gastrointestinal: Negative for abdominal pain, diarrhea and vomiting.  Genitourinary: Negative for dysuria and hematuria.  Musculoskeletal: Negative for arthralgias and back pain.  Skin: Negative for color change and rash.  Neurological: Negative for seizures and syncope.  All other systems reviewed and are negative.    Physical Exam Triage Vital Signs ED Triage Vitals  Enc Vitals Group     BP      Pulse      Resp      Temp      Temp src      SpO2      Weight      Height      Head Circumference  Peak Flow      Pain Score      Pain Loc      Pain Edu?      Excl. in [place]?    No data found.  Updated Vital Signs BP (!) 143/87 (BP Location: Left Arm)   Pulse 82   Temp 98.6 F (37 C) (Oral)   Resp 18   Ht 5\' 6"  (1.676 m)   Wt 225 lb (102.1 kg)   SpO2 95%   BMI 36.32 kg/m   Visual Acuity Right Eye Distance:   Left Eye Distance:   Bilateral Distance:    Right Eye Near:   Left Eye Near:    Bilateral Near:     Physical Exam Vitals and nursing note reviewed.  Constitutional:      General: He is not in acute distress.    Appearance: He is well-developed. He is not ill-appearing.  HENT:     Head: Normocephalic and atraumatic.     Right Ear: Tympanic membrane normal.     Left Ear: Tympanic membrane normal.     Nose: Nose normal.     Mouth/Throat:     Mouth: Mucous membranes are moist.     Pharynx: Oropharynx is clear.     Comments: Clear postnasal drip. Eyes:     Conjunctiva/sclera: Conjunctivae normal.  Cardiovascular:     Rate and Rhythm: Normal rate and regular rhythm.     Heart sounds: Normal heart sounds.  Pulmonary:     Effort:  Pulmonary effort is normal. No respiratory distress.     Breath sounds: Normal breath sounds.  Abdominal:     Palpations: Abdomen is soft.     Tenderness: There is no abdominal tenderness.  Musculoskeletal:     Cervical back: Neck supple.  Skin:    General: Skin is warm and dry.  Neurological:     General: No focal deficit present.     Mental Status: He is alert and oriented to person, place, and time.     Gait: Gait normal.  Psychiatric:        Mood and Affect: Mood normal.        Behavior: Behavior normal.      UC Treatments / Results  Labs (all labs ordered are listed, but only abnormal results are displayed) Labs Reviewed  COVID-19, FLU A+B NAA    EKG   Radiology No results found.  Procedures Procedures (including critical care time)  Medications Ordered in UC Medications - No data to display  Initial Impression / Assessment and Plan / UC Course  I have reviewed the triage vital signs and the nursing notes.  Pertinent labs & imaging results that were available during my care of the patient were reviewed by me and considered in my medical decision making (see chart for details).   Viral URI with cough.  Influenza and COVID pending.  Instructed patient to self quarantine until the test results are back.  Discussed symptomatic treatment including Tylenol or ibuprofen, rest, hydration.  Treating cough with promethazine-dextromethorphan syrup; precautions for drowsiness with this medication discussed.  Instructed patient to follow up with PCP if her symptoms are not improving.  Patient agrees to plan of care.    Final Clinical Impressions(s) / UC Diagnoses   Final diagnoses:  Viral URI with cough     Discharge Instructions     Take the promethazine-dextromethorphan syrup as directed for cough.  Do not drive, operate machinery, or drink alcohol with this medication as it  may cause drowsiness.    Your COVID and Influenza tests are pending.  You should self  quarantine until the test results are back.    Take Tylenol or ibuprofen as needed for fever or discomfort.  Rest and keep yourself hydrated.    Follow-up with your primary care provider if your symptoms are not improving.        ED Prescriptions    Medication Sig Dispense Auth. Provider   promethazine-dextromethorphan (PROMETHAZINE-DM) 6.25-15 MG/5ML syrup Take 5 mLs by mouth 4 (four) times daily as needed for up to 5 days for cough. 100 mL Sharion Balloon, NP     PDMP not reviewed this encounter.   Sharion Balloon, NP 04/21/20 1008

## 2020-04-21 NOTE — Discharge Instructions (Signed)
Take the promethazine-dextromethorphan syrup as directed for cough.  Do not drive, operate machinery, or drink alcohol with this medication as it may cause drowsiness.    Your COVID and Influenza tests are pending.  You should self quarantine until the test results are back.    Take Tylenol or ibuprofen as needed for fever or discomfort.  Rest and keep yourself hydrated.    Follow-up with your primary care provider if your symptoms are not improving.

## 2020-04-21 NOTE — ED Triage Notes (Signed)
Patient in office today [place] c/o Sorethroat, head/chest congestion x 2d.  OTC: Robitussin DM  Denies: Fever, N/V, dizziness

## 2020-04-22 LAB — COVID-19, FLU A+B NAA
Influenza A, NAA: NOT DETECTED
Influenza B, NAA: NOT DETECTED
SARS-CoV-2, NAA: NOT DETECTED

## 2020-04-25 ENCOUNTER — Telehealth: Payer: Self-pay

## 2020-04-25 NOTE — Telephone Encounter (Signed)
Copied from [place] [phone]. Topic: General - Other >> Apr 25, 2020 12:36 PM Keene Breath wrote: Reason for CRM: Patient of Dr. Natale Milch called to ask if he could be worked in fairly soon because he went to Urgent care and was given medicine that is not making him feel better.  He has congestion and feels it might turn into bronchitis.  Explained about staffing issues and he would like a call to see if he could be worked in at some early time.  Please call to discuss at [phone] if possible.

## 2020-04-28 NOTE — Telephone Encounter (Signed)
[name] has slots available this week. [name] got a hospital follow up slot tomorrow that can be used. I doubt a hospital follow up appt will be needed before then.

## 2020-04-28 NOTE — Telephone Encounter (Signed)
Can see if that will work out.

## 2020-04-29 ENCOUNTER — Other Ambulatory Visit: Payer: Self-pay

## 2020-04-29 ENCOUNTER — Telehealth (INDEPENDENT_AMBULATORY_CARE_PROVIDER_SITE_OTHER): Payer: PPO | Admitting: Family Medicine

## 2020-04-29 ENCOUNTER — Encounter: Payer: Self-pay | Admitting: Family Medicine

## 2020-04-29 DIAGNOSIS — J4520 Mild intermittent asthma, uncomplicated: Secondary | ICD-10-CM | POA: Diagnosis not present

## 2020-04-29 DIAGNOSIS — R059 Cough, unspecified: Secondary | ICD-10-CM | POA: Diagnosis not present

## 2020-04-29 DIAGNOSIS — Z8709 Personal history of other diseases of the respiratory system: Secondary | ICD-10-CM

## 2020-04-29 MED ORDER — HYDROCODONE-HOMATROPINE 5-1.5 MG/5ML PO SYRP: 5.0000 mL | ORAL_SOLUTION | Freq: Three times a day (TID) | ORAL | 0 refills | Status: DC | PRN

## 2020-04-29 NOTE — Telephone Encounter (Signed)
lmtcb okay for PEC to schedule with Simona Huh at next availability for hospital follow up. KW

## 2020-04-29 NOTE — Telephone Encounter (Signed)
Patient called and he says he had an appointment this morning with Simona Huh.

## 2020-04-29 NOTE — Progress Notes (Signed)
MyChart Video Visit    Virtual Visit via Video Note   This visit type was conducted due to national recommendations for restrictions regarding the COVID-19 Pandemic (e.g. social distancing) in an effort to limit this patient's exposure and mitigate transmission in our community. This patient is at least at moderate risk for complications without adequate follow up. This format is felt to be most appropriate for this patient at this time. Physical exam was limited by quality of the video and audio technology used for the visit.   Patient location: Home Provider location: Office  I discussed the limitations of evaluation and management by telemedicine and the availability of in person appointments. The patient expressed understanding and agreed to proceed.  Patient: [name]   DOB: 03-Dec-1955   65 y.o. Male  MRN: [id number] Visit Date: 04/29/2020  Today's healthcare provider: Vernie Murders, PA-C   Chief Complaint  Patient presents with  . URI   I,Porsha C McClurkin,acting as a Education administrator for [employer], PA-C.,have documented all relevant documentation on the behalf of Vernie Murders, PA-C,as directed by  [employer], PA-C while in the presence of [employer], PA-C.  Subjective    URI  This is a new problem. The current episode started in the past 7 days. The problem has been unchanged. There has been no fever. Associated symptoms include a sore throat and wheezing. Pertinent negatives include no chest pain, congestion, coughing, diarrhea, ear pain, headaches, nausea, rhinorrhea, sinus pain, sneezing or vomiting. He has tried increased fluids (robitussin-DM) for the symptoms. The treatment provided mild relief.  Patient went to the urgent care on 04/21/2020 and was prescribed Promethazine 6.25-5 MG/5 ML. He reports he can't take the medication.    Past Medical History:  Diagnosis Date  . Allergy   . Anginal pain ([place])   . Anxiety   . Chronic kidney disease     kidney stones  . Colon polyp 2008   Colonoscopy due to repeat in 2011 due to polyps  . Coronary artery disease   . Essential hypertension, benign 06/05/2012  . GERD (gastroesophageal reflux disease)   . History of herpes zoster   . Hypercholesterolemia   . Hyperlipidemia 06/05/2012  . Hypertension   . Left main coronary artery disease 06/05/2012  . Myocardial infarction ([place])   . Obesity (BMI 30-39.9) 06/05/2012  . Obstructive sleep apnea 06/05/2012  . Sleep apnea   . Smoker   . Tobacco abuse 06/05/2012  . Vitamin D deficiency    Past Surgical History:  Procedure Laterality Date  . BASAL CELL CARCINOMA EXCISION  1997  . CARDIAC CATHETERIZATION    . COLONOSCOPY N/A 01/11/2020   Procedure: COLONOSCOPY;  Surgeon: Lesly Rubenstein, MD;  Location: [facility] ENDOSCOPY;  Service: Endoscopy;  Laterality: N/A;  . COLONOSCOPY WITH PROPOFOL N/A 06/10/2015   Procedure: COLONOSCOPY WITH PROPOFOL;  Surgeon: Lollie Sails, MD;  [facility] ENDOSCOPY;  Service: Endoscopy;  Laterality: N/A;  . CORONARY ARTERY BYPASS GRAFT N/A 06/06/2012   Procedure: CORONARY ARTERY BYPASS GRAFTING (CABG);  Surgeon: Grace Isaac, MD;  Location: [place];  Service: Open Heart Surgery;  Laterality: N/A;  x4 using right greater saphenous vein and left internal mammary.   . ENDOVEIN HARVEST OF GREATER SAPHENOUS VEIN Right 06/06/2012   Procedure: ENDOVEIN HARVEST OF GREATER SAPHENOUS VEIN;  Surgeon: Grace Isaac, MD;  Location: [place];  Service: Open Heart Surgery;  Laterality: Right;  . ESOPHAGOGASTRODUODENOSCOPY (EGD) WITH PROPOFOL N/A 06/10/2015   Procedure: ESOPHAGOGASTRODUODENOSCOPY (  EGD) WITH PROPOFOL;  Surgeon: Lollie Sails, MD;  Location: [facility] ENDOSCOPY;  Service: Endoscopy;  Laterality: N/A;  . INTRAOPERATIVE TRANSESOPHAGEAL ECHOCARDIOGRAM N/A 06/06/2012   Procedure: INTRAOPERATIVE TRANSESOPHAGEAL ECHOCARDIOGRAM;  Surgeon: Grace Isaac, MD;  Location: [place];  Service: Open Heart Surgery;  Laterality: N/A;  .  LITHOTRIPSY  01/11/2008  . TONSILLECTOMY     Social History   Tobacco Use  . Smoking status: Former Smoker    Packs/day: 1.50    Years: 37.00    Pack years: 55.50    Types: Cigarettes    Quit date: 06/05/2012    Years since quitting: 7.9  . Smokeless tobacco: Never Used  Vaping Use  . Vaping Use: Never used  Substance Use Topics  . Alcohol use: No  . Drug use: No   Family History  Problem Relation Age of Onset  . Hypertension Other    Allergies  Allergen Reactions  . Sulfa Antibiotics     Other reaction(s): Unknown "Makes him feel worse then better when taken"per wife      Medications: Outpatient Medications Prior to Visit  Medication Sig  . aspirin EC 325 MG EC tablet Take 1 tablet (325 mg total) by mouth daily.  . cetirizine (ZYRTEC) 10 MG tablet Take 10 mg by mouth every evening.  . cholecalciferol (VITAMIN D) 1000 UNITS tablet Take 2,000 Units by mouth every evening.  Marland Kitchen CRESTOR 20 MG tablet TAKE 1 TABLET BY MOUTH EVERY DAY  . finasteride (PROSCAR) 5 MG tablet TAKE 1 TABLET BY MOUTH  DAILY  . fluticasone (FLONASE) 50 MCG/ACT nasal spray SPRAY TWICE IN EACH NOSTRIL ONCE DAILY  . losartan-hydrochlorothiazide (HYZAAR) 100-12.5 MG per tablet   . metoprolol succinate (TOPROL-XL) 50 MG 24 hr tablet TAKE 1 TABLET (50 MG TOTAL) BY MOUTH ONCE DAILY.  . pantoprazole (PROTONIX) 40 MG tablet TAKE 1 TABLET (40 MG TOTAL) BY MOUTH ONCE DAILY. ONE HOUR BEFORE A MEAL.  . tamsulosin (FLOMAX) 0.4 MG CAPS capsule TAKE 1 CAPSULE BY MOUTH  DAILY   No facility-administered medications prior to visit.    Review of Systems  Constitutional: Negative for appetite change, chills, fatigue and fever.  HENT: Positive for postnasal drip and sore throat. Negative for congestion, ear pain, rhinorrhea, sinus pressure, sinus pain and sneezing.   Respiratory: Positive for chest tightness and wheezing. Negative for cough and shortness of breath.   Cardiovascular: Negative for chest pain.   Gastrointestinal: Negative for diarrhea, nausea and vomiting.  Neurological: Negative for weakness and headaches.      Objective    There were no vitals taken for this visit.   Physical Exam:WDWN male in no apparent distress.  Head: Normocephalic, atraumatic. Neck: Supple, NROM Respiratory: No apparent distress Psych: Normal mood and affect   Assessment & Plan    1. Cough Onset over the past week. Went to an Urgent Care clinic and given Promethazine with dextromethorphan. This caused significant dizziness and he stopped taking it. Request narcotic cough syrup for night time cough with slight wheeze. No fever or significant sputum production. Did not get any relief from Mucinex-DM. Will give Hycodan syrup and monitor for COVID symptoms. Recheck prn. - HYDROcodone-homatropine (HYCODAN) 5-1.5 MG/5ML syrup; Take 5 mLs by mouth every 8 (eight) hours as needed for cough.  Dispense: 120 mL; Refill: 0  2. Mild intermittent reactive airway disease without complication Only slight wheeze at night with cough. Will give stronger cough syrup. May need ICS or prednisone taper. Call report as needed. -  HYDROcodone-homatropine (HYCODAN) 5-1.5 MG/5ML syrup; Take 5 mLs by mouth every 8 (eight) hours as needed for cough.  Dispense: 120 mL; Refill: 0  3. History of allergic rhinitis Some rhinorrhea. Should continue Flonase and antihistamine.    No follow-ups on file.     I discussed the assessment and treatment plan with the patient. The patient was provided an opportunity to ask questions and all were answered. The patient agreed with the plan and demonstrated an understanding of the instructions.   The patient was advised to call back or seek an in-person evaluation if the symptoms worsen or if the condition fails to improve as anticipated.  I provided 32 minutes of non-face-to-face time during this encounter.  I, Brunetta Newingham, PA-C, have reviewed all documentation for this visit. The  documentation on 04/29/20 for the exam, diagnosis, procedures, and orders are all accurate and complete.   Vernie Murders, PA-C [employer] [phone] (phone) [phone] (fax)  [place]

## 2020-05-08 ENCOUNTER — Other Ambulatory Visit: Payer: Self-pay | Admitting: Family Medicine

## 2020-05-20 ENCOUNTER — Encounter: Payer: Self-pay | Admitting: Family Medicine

## 2020-06-08 ENCOUNTER — Encounter: Payer: Self-pay | Admitting: Family Medicine

## 2020-06-10 ENCOUNTER — Other Ambulatory Visit: Payer: Self-pay | Admitting: Family Medicine

## 2020-06-10 DIAGNOSIS — R059 Cough, unspecified: Secondary | ICD-10-CM

## 2020-06-11 ENCOUNTER — Ambulatory Visit
Admission: RE | Admit: 2020-06-11 | Discharge: 2020-06-11 | Disposition: A | Discharge status: Home / Self Care | Payer: PPO | Source: Ambulatory Visit | Attending: Family Medicine | Admitting: Family Medicine

## 2020-06-11 ENCOUNTER — Ambulatory Visit
Admission: RE | Admit: 2020-06-11 | Discharge: 2020-06-11 | Disposition: A | Discharge status: Home / Self Care | Payer: PPO | Attending: Family Medicine | Admitting: Family Medicine

## 2020-06-11 ENCOUNTER — Other Ambulatory Visit: Payer: Self-pay

## 2020-06-11 DIAGNOSIS — R059 Cough, unspecified: Secondary | ICD-10-CM | POA: Insufficient documentation

## 2020-07-09 ENCOUNTER — Other Ambulatory Visit: Payer: Self-pay | Admitting: Family Medicine

## 2020-07-09 DIAGNOSIS — N401 Enlarged prostate with lower urinary tract symptoms: Secondary | ICD-10-CM

## 2020-07-23 DIAGNOSIS — R053 Chronic cough: Secondary | ICD-10-CM | POA: Diagnosis not present

## 2020-07-30 DIAGNOSIS — R739 Hyperglycemia, unspecified: Secondary | ICD-10-CM | POA: Diagnosis not present

## 2020-07-30 DIAGNOSIS — R001 Bradycardia, unspecified: Secondary | ICD-10-CM | POA: Diagnosis not present

## 2020-07-30 DIAGNOSIS — E7800 Pure hypercholesterolemia, unspecified: Secondary | ICD-10-CM | POA: Diagnosis not present

## 2020-07-30 DIAGNOSIS — I25810 Atherosclerosis of coronary artery bypass graft(s) without angina pectoris: Secondary | ICD-10-CM | POA: Diagnosis not present

## 2020-07-30 DIAGNOSIS — I10 Essential (primary) hypertension: Secondary | ICD-10-CM | POA: Diagnosis not present

## 2020-08-15 DIAGNOSIS — I25810 Atherosclerosis of coronary artery bypass graft(s) without angina pectoris: Secondary | ICD-10-CM | POA: Diagnosis not present

## 2020-09-15 DIAGNOSIS — X32XXXA Exposure to sunlight, initial encounter: Secondary | ICD-10-CM | POA: Diagnosis not present

## 2020-09-15 DIAGNOSIS — L570 Actinic keratosis: Secondary | ICD-10-CM | POA: Diagnosis not present

## 2020-09-15 DIAGNOSIS — D2262 Melanocytic nevi of left upper limb, including shoulder: Secondary | ICD-10-CM | POA: Diagnosis not present

## 2020-09-15 DIAGNOSIS — L821 Other seborrheic keratosis: Secondary | ICD-10-CM | POA: Diagnosis not present

## 2020-09-15 DIAGNOSIS — Z85828 Personal history of other malignant neoplasm of skin: Secondary | ICD-10-CM | POA: Diagnosis not present

## 2020-09-15 DIAGNOSIS — L920 Granuloma annulare: Secondary | ICD-10-CM | POA: Diagnosis not present

## 2020-09-15 DIAGNOSIS — D485 Neoplasm of uncertain behavior of skin: Secondary | ICD-10-CM | POA: Diagnosis not present

## 2020-09-15 DIAGNOSIS — L820 Inflamed seborrheic keratosis: Secondary | ICD-10-CM | POA: Diagnosis not present

## 2020-09-15 DIAGNOSIS — D2261 Melanocytic nevi of right upper limb, including shoulder: Secondary | ICD-10-CM | POA: Diagnosis not present

## 2020-10-23 ENCOUNTER — Telehealth: Payer: Self-pay | Admitting: Family Medicine

## 2020-10-23 MED ORDER — TAMSULOSIN HCL 0.4 MG PO CAPS: 0.4000 mg | ORAL_CAPSULE | Freq: Every day | ORAL | 0 refills | Status: DC

## 2020-10-23 NOTE — Telephone Encounter (Signed)
[facility] faxed refill request for the following medications:  tamsulosin (FLOMAX) 0.4 MG CAPS capsule  Last Rx: 05/08/20 LOV: 04/29/20 No upcoming scheduled appts at this time. Please advise. Thanks TNP

## 2020-12-24 ENCOUNTER — Telehealth: Payer: Self-pay | Admitting: Family Medicine

## 2020-12-24 NOTE — Telephone Encounter (Signed)
[facility] faxed refill request for the following medications:  finasteride (PROSCAR) 5 MG tablet   Please advise.

## 2020-12-25 ENCOUNTER — Other Ambulatory Visit: Payer: Self-pay

## 2020-12-25 DIAGNOSIS — N401 Enlarged prostate with lower urinary tract symptoms: Secondary | ICD-10-CM

## 2020-12-25 MED ORDER — FINASTERIDE 5 MG PO TABS: 5.0000 mg | ORAL_TABLET | Freq: Every day | ORAL | 0 refills | Status: DC

## 2020-12-30 ENCOUNTER — Telehealth (INDEPENDENT_AMBULATORY_CARE_PROVIDER_SITE_OTHER): Payer: PPO | Admitting: Family Medicine

## 2020-12-30 ENCOUNTER — Encounter: Payer: Self-pay | Admitting: Family Medicine

## 2020-12-30 ENCOUNTER — Other Ambulatory Visit: Payer: Self-pay

## 2020-12-30 DIAGNOSIS — U071 COVID-19: Secondary | ICD-10-CM

## 2020-12-30 MED ORDER — MOLNUPIRAVIR EUA 200MG CAPSULE: 4.0000 | ORAL_CAPSULE | Freq: Two times a day (BID) | ORAL | 0 refills | Status: AC

## 2020-12-30 NOTE — Progress Notes (Signed)
Virtual Visit via Video Note  I connected with [name] on 12/30/20 at  3:40 PM EST by a video enabled telemedicine application and verified that I am speaking with the correct person using two identifiers.  Location: Patient: home Provider: [facility]   I discussed the limitations of evaluation and management by telemedicine and the availability of in person appointments. The patient expressed understanding and agreed to proceed.  History of Present Illness:  UPPER RESPIRATORY TRACT INFECTION - symptom onset 12/24 - home COVID test +  Fever: no Cough: yes, nonproductive Shortness of breath: no Chest pain: no Chest congestion: yes Nasal congestion: yes Post nasal drip: yes Sore throat: yes Sinus pressure: yes Headache: yes Ear pain: no  Ear pressure: no  Vomiting: no Rash: no Treatments attempted:  warm salt water gargles, corcidin    Observations/Objective:  Well appearing, in NAD. Comfortable WOB on RA, no resp distress. Speaks in full sentences.  Assessment and Plan:  [id number] Doing well with mild sx. Is a candidate for COVID treatment, rx molnupiravir. Reviewed OTC symptom relief, self-quarantine guidelines, and emergency precautions.      I discussed the assessment and treatment plan with the patient. The patient was provided an opportunity to ask questions and all were answered. The patient agreed with the plan and demonstrated an understanding of the instructions.   The patient was advised to call back or seek an in-person evaluation if the symptoms worsen or if the condition fails to improve as anticipated.  I provided 14 minutes of non-face-to-face time during this encounter.   Myles Gip, DO

## 2020-12-30 NOTE — Patient Instructions (Signed)
It was great to see you!  Our plans for today:  - See below for self-isolation guidelines. You may end your quarantine once you are 10 days from symptom onset and fever free for 24 hours without use of tylenol or ibuprofen. Wear a well-fitting N95 mask if you have to go out and about. - Take the molnupiravir as directed. See [email or web address] for more information about the medication. - I recommend getting vaccinated once you are healed from your current infection. - Certainly, if you are having difficulties breathing or unable to keep down fluids, go to the Emergency Department.   Take care and seek immediate care sooner if you develop any concerns.   Dr. Ky Barban     Person Under Monitoring Name: [name]  Location: [place] [address]   Infection Prevention Recommendations for Individuals Confirmed to have, or Being Evaluated for, 2019 Novel Coronavirus (COVID-19) Infection Who Receive Care at Home  Individuals who are confirmed to have, or are being evaluated for, COVID-19 should follow the prevention steps below until a healthcare provider or local or state health department says they can return to normal activities.  Stay home except to get medical care You should restrict activities outside your home, except for getting medical care. Do not go to work, school, or public areas, and do not use public transportation or taxis.  Call ahead before visiting your doctor Before your medical appointment, call the healthcare provider and tell them that you have, or are being evaluated for, COVID-19 infection. This will help the healthcare providers office take steps to keep other people from getting infected. Ask your healthcare provider to call the local or state health department.  Monitor your symptoms Seek prompt medical attention if your illness is worsening (e.g., difficulty breathing). Before going to your  medical appointment, call the healthcare provider and tell them that you have, or are being evaluated for, COVID-19 infection. Ask your healthcare provider to call the local or state health department.  Wear a facemask You should wear a facemask that covers your nose and mouth when you are in the same room with other people and when you visit a healthcare provider. People who live with or visit you should also wear a facemask while they are in the same room with you.  Separate yourself from other people in your home As much as possible, you should stay in a different room from other people in your home. Also, you should use a separate bathroom, if available.  Avoid sharing household items You should not share dishes, drinking glasses, cups, eating utensils, towels, bedding, or other items with other people in your home. After using these items, you should wash them thoroughly with soap and water.  Cover your coughs and sneezes Cover your mouth and nose with a tissue when you cough or sneeze, or you can cough or sneeze into your sleeve. Throw used tissues in a lined trash can, and immediately wash your hands with soap and water for at least 20 seconds or use an alcohol-based hand rub.  Wash your [facility] your hands often and thoroughly with soap and water for at least 20 seconds. You can use an alcohol-based hand sanitizer if soap and water are not available and if your hands are not visibly dirty. Avoid touching your eyes, nose, and mouth with unwashed hands.   Prevention Steps for Caregivers and Household Members of Individuals Confirmed to have, or Being Evaluated for, COVID-19 Infection Being Cared for  in the Home  If you live with, or provide care at home for, a person confirmed to have, or being evaluated for, COVID-19 infection please follow these guidelines to prevent infection:  Follow healthcare providers instructions Make sure that you understand and can help the  patient follow any healthcare provider instructions for all care.  Provide for the patients basic needs You should help the patient with basic needs in the home and provide support for getting groceries, prescriptions, and other personal needs.  Monitor the patients symptoms If they are getting sicker, call his or her medical provider and tell them that the patient has, or is being evaluated for, COVID-19 infection. This will help the healthcare providers office take steps to keep other people from getting infected. Ask the healthcare provider to call the local or state health department.  Limit the number of people who have contact with the patient If possible, have only one caregiver for the patient. Other household members should stay in another home or place of residence. If this is not possible, they should stay in another room, or be separated from the patient as much as possible. Use a separate bathroom, if available. Restrict visitors who do not have an essential need to be in the home.  Keep older adults, very young children, and other sick people away from the patient Keep older adults, very young children, and those who have compromised immune systems or chronic health conditions away from the patient. This includes people with chronic heart, lung, or kidney conditions, diabetes, and cancer.  Ensure good ventilation Make sure that shared spaces in the home have good air flow, such as from an air conditioner or an opened window, weather permitting.  Wash your hands often Wash your hands often and thoroughly with soap and water for at least 20 seconds. You can use an alcohol based hand sanitizer if soap and water are not available and if your hands are not visibly dirty. Avoid touching your eyes, nose, and mouth with unwashed hands. Use disposable paper towels to dry your hands. If not available, use dedicated cloth towels and replace them when they become wet.  Wear a  facemask and gloves Wear a disposable facemask at all times in the room and gloves when you touch or have contact with the patients blood, body fluids, and/or secretions or excretions, such as sweat, saliva, sputum, nasal mucus, vomit, urine, or feces.  Ensure the mask fits over your nose and mouth tightly, and do not touch it during use. Throw out disposable facemasks and gloves after using them. Do not reuse. Wash your hands immediately after removing your facemask and gloves. If your personal clothing becomes contaminated, carefully remove clothing and launder. Wash your hands after handling contaminated clothing. Place all used disposable facemasks, gloves, and other waste in a lined container before disposing them with other household waste. Remove gloves and wash your hands immediately after handling these items.  Do not share dishes, glasses, or other household items with the patient Avoid sharing household items. You should not share dishes, drinking glasses, cups, eating utensils, towels, bedding, or other items with a patient who is confirmed to have, or being evaluated for, COVID-19 infection. After the person uses these items, you should wash them thoroughly with soap and water.  Wash laundry thoroughly Immediately remove and wash clothes or bedding that have blood, body fluids, and/or secretions or excretions, such as sweat, saliva, sputum, nasal mucus, vomit, urine, or feces, on them. Wear gloves  when handling laundry from the patient. Read and follow directions on labels of laundry or clothing items and detergent. In general, wash and dry with the warmest temperatures recommended on the label.  Clean all areas the individual has used often Clean all touchable surfaces, such as counters, tabletops, doorknobs, bathroom fixtures, toilets, phones, keyboards, tablets, and bedside tables, every day. Also, clean any surfaces that may have blood, body fluids, and/or secretions or excretions  on them. Wear gloves when cleaning surfaces the patient has come in contact with. Use a diluted bleach solution (e.g., dilute bleach with 1 part bleach and 10 parts water) or a household disinfectant with a label that says EPA-registered for coronaviruses. To make a bleach solution at home, add 1 tablespoon of bleach to 1 quart (4 cups) of water. For a larger supply, add  cup of bleach to 1 gallon (16 cups) of water. Read labels of cleaning products and follow recommendations provided on product labels. Labels contain instructions for safe and effective use of the cleaning product including precautions you should take when applying the product, such as wearing gloves or eye protection and making sure you have good ventilation during use of the product. Remove gloves and wash hands immediately after cleaning.  Monitor yourself for signs and symptoms of illness Caregivers and household members are considered close contacts, should monitor their health, and will be asked to limit movement outside of the home to the extent possible. Follow the monitoring steps for close contacts listed on the symptom monitoring form.   ? If you have additional questions, contact your local health department or call the epidemiologist on call at [phone] (available 24/7). ? This guidance is subject to change. For the most up-to-date guidance from [facility], please refer to their website: [email or web address]

## 2021-01-09 ENCOUNTER — Telehealth: Payer: Self-pay

## 2021-01-09 NOTE — Telephone Encounter (Signed)
Patient scheduled follow up appointment with Mikey Kirschner

## 2021-01-09 NOTE — Telephone Encounter (Signed)
[facility] faxed refill request for the following medications:  finasteride (PROSCAR) 5 MG tablet  90 day supply  Please advise.

## 2021-01-13 ENCOUNTER — Other Ambulatory Visit: Payer: Self-pay | Admitting: Family Medicine

## 2021-01-13 NOTE — Telephone Encounter (Signed)
Requested medications are due for refill today.  yes  Requested medications are on the active medications list.  yes  Last refill. 10/23/2020 #90   Future visit scheduled.   yes  Notes to clinic.  Pt last seen in office 06/04/2019. Pt has had 2 virtual appointments. Please advise.    Requested Prescriptions  Pending Prescriptions Disp Refills   tamsulosin (FLOMAX) 0.4 MG CAPS capsule [Pharmacy Med Name: Tamsulosin HCl 0.4 MG Oral Capsule] 90 capsule 3    Sig: TAKE 1 CAPSULE BY MOUTH DAILY     Urology: Alpha-Adrenergic Blocker Failed - 01/13/2021 11:07 PM      Failed - Last BP in normal range    BP Readings from Last 1 Encounters:  04/21/20 (!) 143/87          Passed - Valid encounter within last 12 months    Recent Outpatient Visits           2 weeks ago [place], DO   8 months ago Cough   [place], Vickki Muff, PA-C   1 year ago Annual physical exam   [place], PA-C   2 years ago Right-sided low back pain without sciatica, unspecified chronicity   [facility], Vickki Muff, PA-C   2 years ago BPH associated with nocturia   [employer] [facility], Vickki Muff, PA-C       Future Appointments             In 3 days Drubel, Ria Comment, PA-C [employer], [place]

## 2021-01-16 ENCOUNTER — Other Ambulatory Visit: Payer: Self-pay

## 2021-01-16 ENCOUNTER — Ambulatory Visit (INDEPENDENT_AMBULATORY_CARE_PROVIDER_SITE_OTHER): Payer: PPO | Admitting: Physician Assistant

## 2021-01-16 ENCOUNTER — Encounter: Payer: Self-pay | Admitting: Physician Assistant

## 2021-01-16 VITALS — BP 120/80 | HR 54 | Temp 97.8°F | Ht 66.0 in | Wt 226.6 lb

## 2021-01-16 DIAGNOSIS — R739 Hyperglycemia, unspecified: Secondary | ICD-10-CM

## 2021-01-16 DIAGNOSIS — I10 Essential (primary) hypertension: Secondary | ICD-10-CM

## 2021-01-16 DIAGNOSIS — R351 Nocturia: Secondary | ICD-10-CM

## 2021-01-16 DIAGNOSIS — N401 Enlarged prostate with lower urinary tract symptoms: Secondary | ICD-10-CM | POA: Diagnosis not present

## 2021-01-16 DIAGNOSIS — R0982 Postnasal drip: Secondary | ICD-10-CM | POA: Diagnosis not present

## 2021-01-16 DIAGNOSIS — E7849 Other hyperlipidemia: Secondary | ICD-10-CM | POA: Diagnosis not present

## 2021-01-16 MED ORDER — AZELASTINE HCL 0.1 % NA SOLN: 2.0000 | Freq: Two times a day (BID) | NASAL | 2 refills | Status: AC

## 2021-01-16 MED ORDER — FINASTERIDE 5 MG PO TABS: 5.0000 mg | ORAL_TABLET | Freq: Every day | ORAL | 1 refills | Status: DC

## 2021-01-16 MED ORDER — TAMSULOSIN HCL 0.4 MG PO CAPS: 0.4000 mg | ORAL_CAPSULE | Freq: Every day | ORAL | 1 refills | Status: DC

## 2021-01-16 NOTE — Assessment & Plan Note (Signed)
Managed by cardiology as well, pt has f/u appt scheduled. Slightly elevated in office, but reports 120s/80s at home. Continue to monitor at home

## 2021-01-16 NOTE — Assessment & Plan Note (Signed)
Symptoms stable managed on finasteride and flomax.  Will check PSA. Pt has never seen urology. Advised if symptoms ever change or psa increases, would need to see urology.

## 2021-01-16 NOTE — Progress Notes (Signed)
I,Jakya Dovidio,acting as a Education administrator for [employer], PA-C.,have documented all relevant documentation on the behalf of Mikey Kirschner, PA-C,as directed by  Mikey Kirschner, PA-C while in the presence of Mikey Kirschner, PA-C.   Established patient visit   Patient: [name]   DOB: Aug 28, 1955   66 y.o. Male  MRN: [id number] Visit Date: 01/16/2021  Today's healthcare provider: Mikey Kirschner, PA-C   Chief Complaint  Patient presents with   Hypertension   Benign Prostatic Hypertrophy   Follow-up   Medication Refill   Subjective    HPI   BPH f/u  -denies changes in nocturia, increase or worsening of symptoms   URI sxs -Pt was dx with covid at the end of December, mild case, reports took one dose of antiviral and had SE of dizziness so did not finish -Now feels increased nasal congestion and PND, uses flonase daily  -causing a dry cough -worse after eating  Hypertension, follow-up  BP Readings from Last 3 Encounters:  01/16/21 120/80  04/21/20 (!) 143/87  01/11/20 131/77   Wt Readings from Last 3 Encounters:  01/16/21 226 lb 9.6 oz (102.8 kg)  04/21/20 225 lb (102.1 kg)  01/11/20 205 lb (93 kg)     He was last seen for hypertension over 1 years ago. (05/17/19) BP at that visit was 124/80. Management since that visit includes well controlled by Hyzaar 100-12.5 mg qd and Metoprolol Succinate 50 mg qd and continue to restrict salt in diet and work on weight loss.Marland Kitchen  He reports excellent compliance with treatment. He is not having side effects.  He is following a Regular diet. He is not exercising. He does not smoke.  Use of agents associated with hypertension: none.   Outside blood pressures are 120/80 in that area if checked at home Symptoms: No chest pain No chest pressure  No palpitations No syncope  No dyspnea No orthopnea  No paroxysmal nocturnal dyspnea No lower extremity edema   Pertinent labs: Lab Results  Component Value Date   CHOL 135  05/17/2019   HDL 43 05/17/2019   LDLCALC 75 05/17/2019   TRIG 90 05/17/2019   CHOLHDL 3.6 02/21/2018   Lab Results  Component Value Date   NA 137 05/17/2019   K 3.8 05/17/2019   CREATININE 1.21 05/17/2019   GFRNONAA 63 05/17/2019   GLUCOSE 100 (H) 05/17/2019   TSH 1.790 05/17/2019     The 10-year ASCVD risk score (Arnett DK, et al., 2019) is: 12%   ------------------------------------------------------------------------------------------------  Medications: Outpatient Medications Prior to Visit  Medication Sig   aspirin EC 325 MG EC tablet Take 1 tablet (325 mg total) by mouth daily.   cetirizine (ZYRTEC) 10 MG tablet Take 10 mg by mouth every evening.   cholecalciferol (VITAMIN D) 1000 UNITS tablet Take 2,000 Units by mouth every evening.   CRESTOR 20 MG tablet TAKE 1 TABLET BY MOUTH EVERY DAY   fluticasone (FLONASE) 50 MCG/ACT nasal spray SPRAY TWICE IN EACH NOSTRIL ONCE DAILY   HYDROcodone-homatropine (HYCODAN) 5-1.5 MG/5ML syrup Take 5 mLs by mouth every 8 (eight) hours as needed for cough.   losartan-hydrochlorothiazide (HYZAAR) 100-12.5 MG per tablet    metoprolol succinate (TOPROL-XL) 50 MG 24 hr tablet TAKE 1 TABLET (50 MG TOTAL) BY MOUTH ONCE DAILY.   pantoprazole (PROTONIX) 40 MG tablet TAKE 1 TABLET (40 MG TOTAL) BY MOUTH ONCE DAILY. ONE HOUR BEFORE A MEAL.   [DISCONTINUED] finasteride (PROSCAR) 5 MG tablet Take 1 tablet (5 mg total) by  mouth daily.   [DISCONTINUED] tamsulosin (FLOMAX) 0.4 MG CAPS capsule Take 1 capsule (0.4 mg total) by mouth daily. Please schedule an office visit before anymore refills.   No facility-administered medications prior to visit.    Review of Systems  HENT:  Positive for congestion, postnasal drip and rhinorrhea.   Respiratory:  Positive for cough. Negative for shortness of breath and wheezing.   Cardiovascular:  Negative for chest pain.      Objective    Blood pressure 120/80, pulse (!) 54, temperature 97.8 F (36.6 C),  temperature source Oral, height 5\' 6"  (1.676 m), weight 226 lb 9.6 oz (102.8 kg), SpO2 96 %.   Physical Exam Constitutional:      General: He is awake.     Appearance: He is well-developed.  HENT:     Head: Normocephalic.     Right Ear: Tympanic membrane normal.     Left Ear: Tympanic membrane normal.     Nose: Congestion present.     Mouth/Throat:     Pharynx: No oropharyngeal exudate or posterior oropharyngeal erythema.  Eyes:     Conjunctiva/sclera: Conjunctivae normal.  Cardiovascular:     Rate and Rhythm: Normal rate and regular rhythm.     Heart sounds: Normal heart sounds.  Pulmonary:     Effort: Pulmonary effort is normal. No respiratory distress.     Breath sounds: Normal breath sounds. No stridor. No wheezing, rhonchi or rales.  Skin:    General: Skin is warm.  Neurological:     Mental Status: He is alert and oriented to person, place, and time.  Psychiatric:        Attention and Perception: Attention normal.        Mood and Affect: Mood normal.        Speech: Speech normal.        Behavior: Behavior is cooperative.      No results found for any visits on 01/16/21.  Assessment & Plan     Post nasal drip Advised continue with flonase, add azelastine before bed. Continue w/ antihistamine   Problem List Items Addressed This Visit       Cardiovascular and Mediastinum   Essential (primary) hypertension    Managed by cardiology as well, pt has f/u appt scheduled. Slightly elevated in office, but reports 120s/80s at home. Continue to monitor at home        Other   Hyperlipidemia (Chronic)    Controlled on statin.  Will recheck lipid and cmp      Relevant Orders   Lipid Profile   Comprehensive Metabolic Panel (CMET)   BPH associated with nocturia - Primary    Symptoms stable managed on finasteride and flomax.  Will check PSA. Pt has never seen urology. Advised if symptoms ever change or psa increases, would need to see urology.        Relevant  Medications   finasteride (PROSCAR) 5 MG tablet   tamsulosin (FLOMAX) 0.4 MG CAPS capsule   Other Relevant Orders   PSA Total (Reflex To Free)   Other Visit Diagnoses     Post-nasal drip       Relevant Medications   azelastine (ASTELIN) 0.1 % nasal spray   Hyperglycemia       Relevant Orders   HgB A1c       Return in about 6 months (around 07/16/2021) for chronic condition f/u.      I, Mikey Kirschner, PA-C have reviewed all documentation for this visit. The documentation  on  01/16/2021 for the exam, diagnosis, procedures, and orders are all accurate and complete.    Mikey Kirschner, PA-C  [facility] [phone] (phone) [phone] (fax)  [place]

## 2021-01-16 NOTE — Assessment & Plan Note (Signed)
Controlled on statin.  Will recheck lipid and cmp

## 2021-01-17 LAB — COMPREHENSIVE METABOLIC PANEL
ALT: 17 IU/L (ref 0–44)
AST: 20 IU/L (ref 0–40)
Albumin/Globulin Ratio: 1.6 (ref 1.2–2.2)
Albumin: 4.2 g/dL (ref 3.8–4.8)
Alkaline Phosphatase: 74 IU/L (ref 44–121)
BUN/Creatinine Ratio: 14 (ref 10–24)
BUN: 15 mg/dL (ref 8–27)
Bilirubin Total: 1.1 mg/dL (ref 0.0–1.2)
CO2: 24 mmol/L (ref 20–29)
Calcium: 9.4 mg/dL (ref 8.6–10.2)
Chloride: 102 mmol/L (ref 96–106)
Creatinine, Ser: 1.1 mg/dL (ref 0.76–1.27)
Globulin, Total: 2.6 g/dL (ref 1.5–4.5)
Glucose: 100 mg/dL — ABNORMAL HIGH (ref 70–99)
Potassium: 4 mmol/L (ref 3.5–5.2)
Sodium: 139 mmol/L (ref 134–144)
Total Protein: 6.8 g/dL (ref 6.0–8.5)
eGFR: 74 mL/min/{1.73_m2} (ref >59)

## 2021-01-17 LAB — PSA TOTAL (REFLEX TO FREE): Prostate Specific Ag, Serum: 1.7 ng/mL (ref 0.0–4.0)

## 2021-01-17 LAB — LIPID PANEL
Chol/HDL Ratio: 3.5 ratio (ref 0.0–5.0)
Cholesterol, Total: 145 mg/dL (ref 100–199)
HDL: 41 mg/dL (ref >39)
LDL Chol Calc (NIH): 83 mg/dL (ref 0–99)
Triglycerides: 115 mg/dL (ref 0–149)
VLDL Cholesterol Cal: 21 mg/dL (ref 5–40)

## 2021-01-17 LAB — HEMOGLOBIN A1C
Est. average glucose Bld gHb Est-mCnc: 123 mg/dL
Hgb A1c MFr Bld: 5.9 % — ABNORMAL HIGH (ref 4.8–5.6)

## 2021-02-06 ENCOUNTER — Encounter: Payer: Self-pay | Admitting: Physician Assistant

## 2021-02-09 ENCOUNTER — Ambulatory Visit
Admission: EM | Admit: 2021-02-09 | Discharge: 2021-02-09 | Disposition: A | Discharge status: Home / Self Care | Payer: PPO | Attending: Emergency Medicine | Admitting: Emergency Medicine

## 2021-02-09 ENCOUNTER — Telehealth: Payer: Self-pay

## 2021-02-09 ENCOUNTER — Other Ambulatory Visit: Payer: Self-pay

## 2021-02-09 DIAGNOSIS — J069 Acute upper respiratory infection, unspecified: Secondary | ICD-10-CM

## 2021-02-09 MED ORDER — AMOXICILLIN-POT CLAVULANATE 875-125 MG PO TABS: 1.0000 | ORAL_TABLET | Freq: Two times a day (BID) | ORAL | 0 refills | Status: DC

## 2021-02-09 NOTE — ED Provider Notes (Signed)
[facility]    CSN: [id number] Arrival date & time: 02/09/21  1056      History   Chief Complaint Chief Complaint  Patient presents with   Cough   Nasal Congestion    HPI [name] is a 66 y.o. male.   Patient presents with nonproductive cough, nasal congestion and chest congestion for 1-1/2 months.  Endorses new wheezing predominantly heard at bedtime.  Symptoms initially began with a COVID-19 infection on 1225, tested positive with a home test.  All accompanying symptoms has resolved but cough and congestion have continued to persist .  Has attempted use of Coricidin, Sudafed, nasal spray and Mucinex which has been helpful in managing symptoms.  Home COVID test 2 days ago negative.  Tolerating food and liquids.  Denies fever, chills, body aches, shortness of breath, chest pain or tightness, sore throat, headaches, ear pain, abdominal pain, nausea, vomiting, diarrhea.   Past Medical History:  Diagnosis Date   Allergy    Anginal pain ([place])    Anxiety    Chronic kidney disease    kidney stones   Colon polyp 2008   Colonoscopy due to repeat in 2011 due to polyps   Coronary artery disease    Essential hypertension, benign 06/05/2012   GERD (gastroesophageal reflux disease)    History of herpes zoster    Hypercholesterolemia    Hyperlipidemia 06/05/2012   Hypertension    Left main coronary artery disease 06/05/2012   Myocardial infarction ([place])    Obesity (BMI 30-39.9) 06/05/2012   Obstructive sleep apnea 06/05/2012   Sleep apnea    Smoker    Tobacco abuse 06/05/2012   Vitamin D deficiency     Patient Active Problem List   Diagnosis Date Noted   BPH associated with nocturia 01/16/2021   Allergic rhinitis 07/25/2014   Chest pain 07/25/2014   Abdominal discomfort, epigastric 07/25/2014   Essential (primary) hypertension 07/25/2014   Genital herpes 07/25/2014   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   H/O coronary artery bypass surgery 07/25/2014   Personal history  of infectious and parasitic disease 07/25/2014   Hypercholesteremia 07/25/2014   Blood glucose elevated 07/25/2014   Personal history of urinary calculi 07/25/2014   Cervical pain 07/25/2014   Adiposity 07/25/2014   Hypercholesterolemia without hypertriglyceridemia 07/25/2014   Avitaminosis D 07/25/2014   Hypertensive urgency 07/19/2014   Left main coronary artery disease 06/05/2012   Coronary artery disease 06/05/2012   Tobacco abuse 06/05/2012   Hyperlipidemia 06/05/2012   Obesity (BMI 30-39.9) 06/05/2012   Obstructive sleep apnea 06/05/2012   GERD (gastroesophageal reflux disease) 06/05/2012    Past Surgical History:  Procedure Laterality Date   BASAL CELL CARCINOMA EXCISION  1997   CARDIAC CATHETERIZATION     COLONOSCOPY N/A 01/11/2020   Procedure: COLONOSCOPY;  Surgeon: Lesly Rubenstein, MD;  Location: [facility] ENDOSCOPY;  Service: Endoscopy;  Laterality: N/A;   COLONOSCOPY WITH PROPOFOL N/A 06/10/2015   Procedure: COLONOSCOPY WITH PROPOFOL;  Surgeon: Lollie Sails, MD;  Location[facility]COPY;  Service: Endoscopy;  Laterality: N/A;   CORONARY ARTERY BYPASS GRAFT N/A 06/06/2012   Procedure: CORONARY ARTERY BYPASS GRAFTING (CABG);  Surgeon: Grace Isaac, MD;  Location: [place];  Service: Open Heart Surgery;  Laterality: N/A;  x4 using right greater saphenous vein and left internal mammary.    ENDOVEIN HARVEST OF GREATER SAPHENOUS VEIN Right 06/06/2012   Procedure: ENDOVEIN HARVEST OF GREATER SAPHENOUS VEIN;  Surgeon: Grace Isaac, MD;  Location: [place];  Service:  Open Heart Surgery;  Laterality: Right;   ESOPHAGOGASTRODUODENOSCOPY (EGD) WITH PROPOFOL N/A 06/10/2015   Procedure: ESOPHAGOGASTRODUODENOSCOPY (EGD) WITH PROPOFOL;  Surgeon: Lollie Sails, MD;  Location: [facility] ENDOSCOPY;  Service: Endoscopy;  Laterality: N/A;   INTRAOPERATIVE TRANSESOPHAGEAL ECHOCARDIOGRAM N/A 06/06/2012   Procedure: INTRAOPERATIVE TRANSESOPHAGEAL ECHOCARDIOGRAM;  Surgeon: Grace Isaac, MD;   Location: [place];  Service: Open Heart Surgery;  Laterality: N/A;   LITHOTRIPSY  01/11/2008   TONSILLECTOMY         Home Medications    Prior to Admission medications   Medication Sig Start Date End Date Taking? Authorizing Provider  aspirin EC 325 MG EC tablet Take 1 tablet (325 mg total) by mouth daily. 06/10/12  Yes Gold, Wayne E, PA-C  azelastine (ASTELIN) 0.1 % nasal spray Place 2 sprays into both nostrils 2 (two) times daily. Use in each nostril as directed 01/16/21  Yes Drubel, Ria Comment, PA-C  cetirizine (ZYRTEC) 10 MG tablet Take 10 mg by mouth every evening.   Yes [provider]  cholecalciferol (VITAMIN D) 1000 UNITS tablet Take 2,000 Units by mouth every evening.   Yes [provider]  CRESTOR 20 MG tablet TAKE 1 TABLET BY MOUTH EVERY DAY 11/04/14  Yes Chrismon, Vickki Muff, PA-C  finasteride (PROSCAR) 5 MG tablet Take 1 tablet (5 mg total) by mouth daily. 01/16/21  Yes Drubel, Ria Comment, PA-C  fluticasone (FLONASE) 50 MCG/ACT nasal spray SPRAY TWICE IN EACH NOSTRIL ONCE DAILY 03/03/15  Yes [provider]  losartan-hydrochlorothiazide (HYZAAR) 100-12.5 MG per tablet  07/23/14  Yes [provider]  metoprolol succinate (TOPROL-XL) 50 MG 24 hr tablet TAKE 1 TABLET (50 MG TOTAL) BY MOUTH ONCE DAILY. 07/07/14  Yes [provider]  pantoprazole (PROTONIX) 40 MG tablet TAKE 1 TABLET (40 MG TOTAL) BY MOUTH ONCE DAILY. ONE HOUR BEFORE A MEAL. 10/08/15  Yes [provider]  tamsulosin (FLOMAX) 0.4 MG CAPS capsule Take 1 capsule (0.4 mg total) by mouth daily. Please schedule an office visit before anymore refills. 01/16/21  Yes Drubel, Ria Comment, PA-C  HYDROcodone-homatropine (HYCODAN) 5-1.5 MG/5ML syrup Take 5 mLs by mouth every 8 (eight) hours as needed for cough. 04/29/20   Chrismon, Vickki Muff, PA-C    Family History Family History  Problem Relation Age of Onset   Hypertension Other     Social History Social History   Tobacco Use   Smoking  status: Former    Packs/day: 1.50    Years: 37.00    Pack years: 55.50    Types: Cigarettes    Quit date: 06/05/2012    Years since quitting: 8.6   Smokeless tobacco: Never  Vaping Use   Vaping Use: Never used  Substance Use Topics   Alcohol use: No   Drug use: No     Allergies   Sulfa antibiotics   Review of Systems Review of Systems  Constitutional: Negative.   HENT:  Positive for congestion and rhinorrhea. Negative for dental problem, drooling, ear discharge, ear pain, facial swelling, hearing loss, mouth sores, nosebleeds, postnasal drip, sinus pressure, sinus pain, sneezing, sore throat, tinnitus, trouble swallowing and voice change.   Respiratory:  Positive for cough and wheezing. Negative for apnea, choking, chest tightness, shortness of breath and stridor.   Cardiovascular: Negative.   Gastrointestinal: Negative.   Skin: Negative.   Neurological: Negative.     Physical Exam Triage Vital Signs ED Triage Vitals  Enc Vitals Group     BP 02/09/21 1154 132/76     Pulse  Rate 02/09/21 1154 66     Resp 02/09/21 1154 18     Temp 02/09/21 1154 98.3 F (36.8 C)     Temp Source 02/09/21 1154 Oral     SpO2 02/09/21 1154 97 %     Weight 02/09/21 1153 225 lb (102.1 kg)     Height 02/09/21 1153 5\' 7"  (1.702 m)     Head Circumference --      Peak Flow --      Pain Score 02/09/21 1153 0     Pain Loc --      Pain Edu? --      Excl. in [place]? --    No data found.  Updated Vital Signs BP 132/76 (BP Location: Left Arm)    Pulse 66    Temp 98.3 F (36.8 C) (Oral)    Resp 18    Ht 5\' 7"  (1.702 m)    Wt 225 lb (102.1 kg)    SpO2 97%    BMI 35.24 kg/m   Visual Acuity Right Eye Distance:   Left Eye Distance:   Bilateral Distance:    Right Eye Near:   Left Eye Near:    Bilateral Near:     Physical Exam Constitutional:      Appearance: Normal appearance.  HENT:     Head: Normocephalic.     Right Ear: Tympanic membrane, ear canal and external ear normal.     Left Ear:  Tympanic membrane, ear canal and external ear normal.     Nose: Congestion present. No rhinorrhea.     Mouth/Throat:     Mouth: Mucous membranes are moist.     Pharynx: Posterior oropharyngeal erythema present.  Eyes:     Extraocular Movements: Extraocular movements intact.  Cardiovascular:     Rate and Rhythm: Normal rate and regular rhythm.     Pulses: Normal pulses.     Heart sounds: Normal heart sounds.  Pulmonary:     Effort: Pulmonary effort is normal.     Breath sounds: Normal breath sounds.  Musculoskeletal:     Cervical back: Normal range of motion and neck supple.  Skin:    General: Skin is warm and dry.  Neurological:     Mental Status: He is alert and oriented to person, place, and time. Mental status is at baseline.  Psychiatric:        Mood and Affect: Mood normal.        Behavior: Behavior normal.     UC Treatments / Results  Labs (all labs ordered are listed, but only abnormal results are displayed) Labs Reviewed - No data to display  EKG   Radiology No results found.  Procedures Procedures (including critical care time)  Medications Ordered in UC Medications - No data to display  Initial Impression / Assessment and Plan / UC Course  I have reviewed the triage vital signs and the nursing notes.  Pertinent labs & imaging results that were available during my care of the patient were reviewed by me and considered in my medical decision making (see chart for details).  Upper respiratory infection  Vital signs are stable, O2 saturation 97% on room air and lungs are clear to auscultation, will defer imaging as low suspicion of pneumonia, pneumothorax or bronchitis, etiology of symptoms are most likely a bacterial infection of the upper airways, symptoms most likely started off as viral as related to the COVID-19 infection but have prolonged due to bacteria therefore Augmentin 7-day course prescribed, patient  to continue use of over-the-counter medicine as  they have been effective may follow-up with urgent care as needed, PCP referral placed as patient endorses that he is wanting to change primary physicians Final Clinical Impressions(s) / UC Diagnoses   Final diagnoses:  None   Discharge Instructions   None    ED Prescriptions   None    PDMP not reviewed this encounter.   Hans Eden, NP 02/09/21 1249

## 2021-02-09 NOTE — Discharge Instructions (Signed)
Your symptoms are most likely started off as a viral related to the COVID-19 infection however do to the length of your illness it is most likely that there is some form of a bacteria present  Take Augmentin twice a day for the next 7 days to combat bacteria  Continue use of over-the-counter medications to further help with symptoms  A primary care referral has been placed for you to help you find a new doctor   may follow-up with urgent care as needed

## 2021-02-09 NOTE — ED Triage Notes (Signed)
Pt here with C/O cough, nasal congestion, for 1.5 months. Been to DR was Rxed nasal spray. Has been taken OTC medication with no relief.

## 2021-02-09 NOTE — Telephone Encounter (Signed)
Copied from [place] [id number]. Topic: General - Other >> Feb 06, 2021 12:49 PM Tessa Lerner A wrote: Reason for CRM: The patient has called to share that their azelastine (ASTELIN) 0.1 % nasal spray [[id number]]  prescription is ineffective  The patient would like to be prescribed an alternative medication to help with their continued cough and nasal discomfort  The patient has declined to schedule an additional appt at the time of call with agent   Please contact further when possible

## 2021-02-11 ENCOUNTER — Telehealth: Payer: Self-pay

## 2021-02-11 NOTE — Telephone Encounter (Signed)
-----   Message from Curt Jews, RN sent at 02/11/2021 11:00 AM EST ----- Regarding: UC to PCP Patient needs to establish with PCP - routine

## 2021-02-11 NOTE — Telephone Encounter (Signed)
Patient has pcp

## 2021-02-11 NOTE — Telephone Encounter (Signed)
Patient has a pcp set up

## 2021-04-01 ENCOUNTER — Telehealth: Payer: Self-pay | Admitting: Physician Assistant

## 2021-04-01 NOTE — Telephone Encounter (Signed)
Copied from [place] [phone]. Topic: Medicare AWV ?>> Apr 01, 2021 11:12 AM Cher Nakai R wrote: ? ?Left message for patient to call back and schedule Medicare Annual Wellness Visit (AWV) in office.  ? ?If not able to come in the office, please offer to do virtually or by telephone.  ? ?No hx of AWV - AWV-I eligible per [employer] as of 11/04/2020 ? ?Please schedule at anytime with [facility] Health Advisor.  ? ?45 minute appointment ? ?Any questions, please contact me at [phone] ?

## 2021-04-07 ENCOUNTER — Ambulatory Visit: Payer: PPO

## 2021-04-13 DIAGNOSIS — R739 Hyperglycemia, unspecified: Secondary | ICD-10-CM | POA: Insufficient documentation

## 2021-04-14 ENCOUNTER — Ambulatory Visit (INDEPENDENT_AMBULATORY_CARE_PROVIDER_SITE_OTHER): Payer: PPO

## 2021-04-14 VITALS — Wt 225.0 lb

## 2021-04-14 DIAGNOSIS — Z0000 Encounter for general adult medical examination without abnormal findings: Secondary | ICD-10-CM

## 2021-04-14 NOTE — Patient Instructions (Addendum)
Mr. Meidinger , ?Thank you for taking time to come for your Medicare Wellness Visit. I appreciate your ongoing commitment to your health goals. Please review the following plan we discussed and let me know if I can assist you in the future.  ? ?Screening recommendations/referrals: ?Colonoscopy: 01/11/20 ?Recommended yearly ophthalmology/optometry visit for glaucoma screening and checkup ?Recommended yearly dental visit for hygiene and checkup ? ?Vaccinations: ?Influenza vaccine: n/d ?Pneumococcal vaccine: n/d ?Tdap vaccine: 12/21/11 ?Shingles vaccine: n/d   ?Covid-19: had first two per pt ? ?Advanced directives: no ? ?Conditions/risks identified: none ? ?Next appointment: Follow up in one year for your annual wellness visit. - 04/19/22 @ 8:15am by phone ? ?Preventive Care 67 Years and Older, Male ?Preventive care refers to lifestyle choices and visits with your health care provider that can promote health and wellness. ?What does preventive care include? ?A yearly physical exam. This is also called an annual well check. ?Dental exams once or twice a year. ?Routine eye exams. Ask your health care provider how often you should have your eyes checked. ?Personal lifestyle choices, including: ?Daily care of your teeth and gums. ?Regular physical activity. ?Eating a healthy diet. ?Avoiding tobacco and drug use. ?Limiting alcohol use. ?Practicing safe sex. ?Taking low doses of aspirin every day. ?Taking vitamin and mineral supplements as recommended by your health care provider. ?What happens during an annual well check? ?The services and screenings done by your health care provider during your annual well check will depend on your age, overall health, lifestyle risk factors, and family history of disease. ?Counseling  ?Your health care provider may ask you questions about your: ?Alcohol use. ?Tobacco use. ?Drug use. ?Emotional well-being. ?Home and relationship well-being. ?Sexual activity. ?Eating habits. ?History of  falls. ?Memory and ability to understand (cognition). ?Work and work Statistician. ?Screening  ?You may have the following tests or measurements: ?Height, weight, and BMI. ?Blood pressure. ?Lipid and cholesterol levels. These may be checked every 5 years, or more frequently if you are over 71 years old. ?Skin check. ?Lung cancer screening. You may have this screening every year starting at age 58 if you have a 30-pack-year history of smoking and currently smoke or have quit within the past 15 years. ?Fecal occult blood test (FOBT) of the stool. You may have this test every year starting at age 24. ?Flexible sigmoidoscopy or colonoscopy. You may have a sigmoidoscopy every 5 years or a colonoscopy every 10 years starting at age 69. ?Prostate cancer screening. Recommendations will vary depending on your family history and other risks. ?Hepatitis C blood test. ?Hepatitis B blood test. ?Sexually transmitted disease (STD) testing. ?Diabetes screening. This is done by checking your blood sugar (glucose) after you have not eaten for a while (fasting). You may have this done every 1-3 years. ?Abdominal aortic aneurysm (AAA) screening. You may need this if you are a current or former smoker. ?Osteoporosis. You may be screened starting at age 38 if you are at high risk. ?Talk with your health care provider about your test results, treatment options, and if necessary, the need for more tests. ?Vaccines  ?Your health care provider may recommend certain vaccines, such as: ?Influenza vaccine. This is recommended every year. ?Tetanus, diphtheria, and acellular pertussis (Tdap, Td) vaccine. You may need a Td booster every 10 years. ?Zoster vaccine. You may need this after age 65. ?Pneumococcal 13-valent conjugate (PCV13) vaccine. One dose is recommended after age 98. ?Pneumococcal polysaccharide (PPSV23) vaccine. One dose is recommended after age 18. ?Talk to your  health care provider about which screenings and vaccines you need and  how often you need them. ?This information is not intended to replace advice given to you by your health care provider. Make sure you discuss any questions you have with your health care provider. ?Document Released: 01/17/2015 Document Revised: 09/10/2015 Document Reviewed: 10/22/2014 ?Elsevier Interactive Patient Education ? 2017 [employer]. ? ?Fall Prevention in the Home ?Falls can cause injuries. They can happen to people of all ages. There are many things you can do to make your home safe and to help prevent falls. ?What can I do on the outside of my home? ?Regularly fix the edges of walkways and driveways and fix any cracks. ?Remove anything that might make you trip as you walk through a door, such as a raised step or threshold. ?Trim any bushes or trees on the path to your home. ?Use bright outdoor lighting. ?Clear any walking paths of anything that might make someone trip, such as rocks or tools. ?Regularly check to see if handrails are loose or broken. Make sure that both sides of any steps have handrails. ?Any raised decks and porches should have guardrails on the edges. ?Have any leaves, snow, or ice cleared regularly. ?Use sand or salt on walking paths during winter. ?Clean up any spills in your garage right away. This includes oil or grease spills. ?What can I do in the bathroom? ?Use night lights. ?Install grab bars by the toilet and in the tub and shower. Do not use towel bars as grab bars. ?Use non-skid mats or decals in the tub or shower. ?If you need to sit down in the shower, use a plastic, non-slip stool. ?Keep the floor dry. Clean up any water that spills on the floor as soon as it happens. ?Remove soap buildup in the tub or shower regularly. ?Attach bath mats securely with double-sided non-slip rug tape. ?Do not have throw rugs and other things on the floor that can make you trip. ?What can I do in the bedroom? ?Use night lights. ?Make sure that you have a light by your bed that is easy to  reach. ?Do not use any sheets or blankets that are too big for your bed. They should not hang down onto the floor. ?Have a firm chair that has side arms. You can use this for support while you get dressed. ?Do not have throw rugs and other things on the floor that can make you trip. ?What can I do in the kitchen? ?Clean up any spills right away. ?Avoid walking on wet floors. ?Keep items that you use a lot in easy-to-reach places. ?If you need to reach something above you, use a strong step stool that has a grab bar. ?Keep electrical cords out of the way. ?Do not use floor polish or wax that makes floors slippery. If you must use wax, use non-skid floor wax. ?Do not have throw rugs and other things on the floor that can make you trip. ?What can I do with my stairs? ?Do not leave any items on the stairs. ?Make sure that there are handrails on both sides of the stairs and use them. Fix handrails that are broken or loose. Make sure that handrails are as long as the stairways. ?Check any carpeting to make sure that it is firmly attached to the stairs. Fix any carpet that is loose or worn. ?Avoid having throw rugs at the top or bottom of the stairs. If you do have throw rugs, attach them  to the floor with carpet tape. ?Make sure that you have a light switch at the top of the stairs and the bottom of the stairs. If you do not have them, ask someone to add them for you. ?What else can I do to help prevent falls? ?Wear shoes that: ?Do not have high heels. ?Have rubber bottoms. ?Are comfortable and fit you well. ?Are closed at the toe. Do not wear sandals. ?If you use a stepladder: ?Make sure that it is fully opened. Do not climb a closed stepladder. ?Make sure that both sides of the stepladder are locked into place. ?Ask someone to hold it for you, if possible. ?Clearly mark and make sure that you can see: ?Any grab bars or handrails. ?First and last steps. ?Where the edge of each step is. ?Use tools that help you move  around (mobility aids) if they are needed. These include: ?Canes. ?Walkers. ?Scooters. ?Crutches. ?Turn on the lights when you go into a dark area. Replace any light bulbs as soon as they burn out. ?Set up your furn

## 2021-04-14 NOTE — Progress Notes (Signed)
Virtual Visit via Telephone Note  I connected with  Elvera Lennox on 04/14/21 at  9:00 AM EDT by telephone and verified that I am speaking with the correct person using two identifiers.  Location: Patient: home Provider: [facility] Persons participating in the virtual visit: [place]   I discussed the limitations, risks, security and privacy concerns of performing an evaluation and management service by telephone and the availability of in person appointments. The patient expressed understanding and agreed to proceed.  Interactive audio and video telecommunications were attempted between this nurse and patient, however failed, due to patient having technical difficulties OR patient did not have access to video capability.  We continued and completed visit with audio only.  Some vital signs may be absent or patient reported.   Dionisio David, LPN  Subjective:   [name] is a 66 y.o. male who presents for an Initial Medicare Annual Wellness Visit.  Review of Systems           Objective:    There were no vitals filed for this visit. There is no height or weight on file to calculate BMI.     02/09/2021   11:43 AM 01/11/2020   11:02 AM 08/08/2014    9:56 AM 07/19/2014    2:08 AM 07/18/2014    7:10 PM 06/05/2012    5:19 PM  Advanced Directives  Does Patient Have a Medical Advance Directive? No Yes Yes Yes No Patient has advance directive, copy not in chart  Type of Advance Directive  [place];Living will Living will Living will  [place];Living will  Does patient want to make changes to medical advance directive?    No - Patient declined    Copy of [place] in Chart?    No - copy requested  Copy requested from family  Would patient like information on creating a medical advance directive?    No - patient declined information No - patient declined information   Pre-existing out of facility DNR order (yellow  form or pink MOST form)      No    Current Medications (verified) Outpatient Encounter Medications as of 04/14/2021  Medication Sig   losartan-hydrochlorothiazide (HYZAAR) 100-12.5 MG tablet Take 1 tablet by mouth daily.   Na Sulfate-K Sulfate-Mg Sulf 17.5-3.13-1.6 GM/177ML SOLN Take by mouth.   amoxicillin-clavulanate (AUGMENTIN) 875-125 MG tablet Take 1 tablet by mouth every 12 (twelve) hours.   aspirin 325 MG tablet daily.   aspirin EC 325 MG EC tablet Take 1 tablet (325 mg total) by mouth daily.   azelastine (ASTELIN) 0.1 % nasal spray Place 2 sprays into both nostrils 2 (two) times daily. Use in each nostril as directed   cetirizine (ZYRTEC) 10 MG tablet Take 10 mg by mouth every evening.   cholecalciferol (VITAMIN D) 1000 UNITS tablet Take 2,000 Units by mouth every evening.   Cholecalciferol (VITAMIN D3) 1.25 MG (50000 UT) CAPS Vitamin D3   CRESTOR 20 MG tablet TAKE 1 TABLET BY MOUTH EVERY DAY   finasteride (PROSCAR) 5 MG tablet Take 1 tablet (5 mg total) by mouth daily.   fluticasone (FLONASE) 50 MCG/ACT nasal spray SPRAY TWICE IN EACH NOSTRIL ONCE DAILY   fluticasone (FLONASE) 50 MCG/ACT nasal spray fluticasone propionate 50 mcg/actuation nasal spray,suspension  SPRAY TWICE IN EACH NOSTRIL ONCE DAILY   hydrALAZINE (APRESOLINE) 25 MG tablet hydralazine 25 mg tablet   HYDROcodone-homatropine (HYCODAN) 5-1.5 MG/5ML syrup Take 5 mLs by mouth every  8 (eight) hours as needed for cough.   losartan-hydrochlorothiazide (HYZAAR) 100-12.5 MG per tablet    meloxicam (MOBIC) 15 MG tablet meloxicam 15 mg tablet  TAKE 1 TABLET BY MOUTH EVERY DAY   metoprolol succinate (TOPROL-XL) 50 MG 24 hr tablet TAKE 1 TABLET (50 MG TOTAL) BY MOUTH ONCE DAILY.   metoprolol tartrate (LOPRESSOR) 25 MG tablet metoprolol tartrate 25 mg tablet   omeprazole (PRILOSEC) 40 MG capsule omeprazole 40 mg capsule,delayed release   pantoprazole (PROTONIX) 40 MG tablet TAKE 1 TABLET (40 MG TOTAL) BY MOUTH ONCE DAILY. ONE  HOUR BEFORE A MEAL.   tamsulosin (FLOMAX) 0.4 MG CAPS capsule Take 1 capsule (0.4 mg total) by mouth daily. Please schedule an office visit before anymore refills.   No facility-administered encounter medications on file as of 04/14/2021.    Allergies (verified) Sulfa antibiotics   History: Past Medical History:  Diagnosis Date   Allergy    Anginal pain ([place])    Anxiety    Chronic kidney disease    kidney stones   Colon polyp 2008   Colonoscopy due to repeat in 2011 due to polyps   Coronary artery disease    Essential hypertension, benign 06/05/2012   GERD (gastroesophageal reflux disease)    History of herpes zoster    Hypercholesterolemia    Hyperlipidemia 06/05/2012   Hypertension    Left main coronary artery disease 06/05/2012   Myocardial infarction ([place])    Obesity (BMI 30-39.9) 06/05/2012   Obstructive sleep apnea 06/05/2012   Sleep apnea    Smoker    Tobacco abuse 06/05/2012   Vitamin D deficiency    Past Surgical History:  Procedure Laterality Date   BASAL CELL CARCINOMA EXCISION  1997   CARDIAC CATHETERIZATION     COLONOSCOPY N/A 01/11/2020   Procedure: COLONOSCOPY;  Surgeon: Lesly Rubenstein, MD;  Location: [facility] ENDOSCOPY;  Service: Endoscopy;  Laterality: N/A;   COLONOSCOPY WITH PROPOFOL N/A 06/10/2015   Procedure: COLONOSCOPY WITH PROPOFOL;  Surgeon: Lollie Sails, MD;  Location: [facility] ENDOSCOPY;  Service: Endoscopy;  Laterality: N/A;   CORONARY ARTERY BYPASS GRAFT N/A 06/06/2012   Procedure: CORONARY ARTERY BYPASS GRAFTING (CABG);  Surgeon: Grace Isaac, MD;  Location: [facility] [place];  Service: Open Heart Surgery;  Laterality: N/A;  x4 using right greater saphenous vein and left internal mammary.    ENDOVEIN HARVEST OF GREATER SAPHENOUS VEIN Right 06/06/2012   Procedure: ENDOVEIN HARVEST OF GREATER SAPHENOUS VEIN;  Surgeon: Grace Isaac, MD;  Location: [place];  Service: Open Heart Surgery;  Laterality: Right;   ESOPHAGOGASTRODUODENOSCOPY (EGD) WITH PROPOFOL N/A 06/10/2015    Procedure: ESOPHAGOGASTRODUODENOSCOPY (EGD) WITH PROPOFOL;  Surgeon: Lollie Sails, MD;  Location: [facility] ENDOSCOPY;  Service: Endoscopy;  Laterality: N/A;   INTRAOPERATIVE TRANSESOPHAGEAL ECHOCARDIOGRAM N/A 06/06/2012   Procedure: INTRAOPERATIVE TRANSESOPHAGEAL ECHOCARDIOGRAM;  Surgeon: Grace Isaac, MD;  Location: [place];  Service: Open Heart Surgery;  Laterality: N/A;   LITHOTRIPSY  01/11/2008   TONSILLECTOMY     Family History  Problem Relation Age of Onset   Hypertension Other    Social History   Socioeconomic History   Marital status: Married    Spouse name: Not on file   Number of children: Not on file   Years of education: Not on file   Highest education level: Not on file  Occupational History   Occupation: retired  Tobacco Use   Smoking status: Former    Packs/day: 1.50    Years: 37.00  Pack years: 55.50    Types: Cigarettes    Quit date: 06/05/2012    Years since quitting: 8.8   Smokeless tobacco: Never  Vaping Use   Vaping Use: Never used  Substance and Sexual Activity   Alcohol use: No   Drug use: No   Sexual activity: Not on file  Other Topics Concern   Not on file  Social History Narrative   Not on file   Social Determinants of Health   Financial Resource Strain: Not on file  Food Insecurity: Not on file  Transportation Needs: Not on file  Physical Activity: Not on file  Stress: Not on file  Social Connections: Not on file    Tobacco Counseling Counseling given: Not Answered   Clinical Intake:  Pre-visit preparation completed: Yes  Pain : No/denies pain     Nutritional Risks: None Diabetes: No  How often do you need to have someone help you when you read instructions, pamphlets, or other written materials from your doctor or pharmacy?: 1 - Never  Diabetic?no     Information entered by :: Kirke Shaggy, LPN   Activities of Daily Living    01/16/2021   10:00 AM  In your present state of health, do you have any difficulty  performing the following activities:  Hearing? 0  Vision? 0  Difficulty concentrating or making decisions? 0  Walking or climbing stairs? 0  Dressing or bathing? 0  Doing errands, shopping? 0    Patient Care Team: Mikey Kirschner, PA-C as PCP - General (Physician Assistant) Ubaldo Glassing Javier Docker, MD as Attending Physician (Cardiology)  Indicate any recent Medical Services you may have received from other than [facility] providers in the past year (date may be approximate).     Assessment:   This is a routine wellness examination for [place].  Hearing/Vision screen No results found.  Dietary issues and exercise activities discussed:     Goals Addressed   None    Depression Screen    01/16/2021   10:00 AM 05/17/2019    9:07 AM 02/21/2018    9:07 AM  PHQ 2/9 Scores  PHQ - 2 Score 0 0 0  PHQ- 9 Score 0 0 0    Fall Risk    01/16/2021   10:00 AM 05/17/2019    9:07 AM  Fall Risk   Falls in the past year? 0 0  Number falls in past yr: 0   Injury with Fall? 0   Risk for fall due to : No Fall Risks     FALL RISK PREVENTION PERTAINING TO THE HOME:  Any stairs in or around the home? No  If so, are there any without handrails? No  Home free of loose throw rugs in walkways, pet beds, electrical cords, etc? Yes  Adequate lighting in your home to reduce risk of falls? Yes   ASSISTIVE DEVICES UTILIZED TO PREVENT FALLS:  Life alert? No  Use of a cane, walker or w/c? No  Grab bars in the bathroom? No  Shower chair or bench in shower? No  Elevated toilet seat or a handicapped toilet? No           Immunizations Immunization History  Administered Date(s) Administered   Tdap 12/21/2011    TDAP status: Up to date  Flu Vaccine status: Declined, Education has been provided regarding the importance of this vaccine but patient still declined. Advised may receive this vaccine at local pharmacy or Health Dept. Aware to provide a copy of the  vaccination record if obtained from local  pharmacy or Health Dept. Verbalized acceptance and understanding.  Pneumococcal vaccine status: Declined,  Education has been provided regarding the importance of this vaccine but patient still declined. Advised may receive this vaccine at local pharmacy or Health Dept. Aware to provide a copy of the vaccination record if obtained from local pharmacy or Health Dept. Verbalized acceptance and understanding.   Covid-19 vaccine status: Completed vaccines  Qualifies for Shingles Vaccine? Yes   Zostavax completed No   Shingrix Completed?: No.    Education has been provided regarding the importance of this vaccine. Patient has been advised to call insurance company to determine out of pocket expense if they have not yet received this vaccine. Advised may also receive vaccine at local pharmacy or Health Dept. Verbalized acceptance and understanding.  Screening Tests Health Maintenance  Topic Date Due   COVID-19 Vaccine (1) Never done   Zoster Vaccines- Shingrix (1 of 2) Never done   Pneumonia Vaccine 67+ Years old (1 - PCV) Never done   INFLUENZA VACCINE  08/04/2021   TETANUS/TDAP  12/20/2021   COLONOSCOPY (Pts 45-31yr Insurance coverage will need to be confirmed)  01/10/2030   Hepatitis C Screening  Completed   HPV VACCINES  Aged Out    Health Maintenance  Health Maintenance Due  Topic Date Due   COVID-19 Vaccine (1) Never done   Zoster Vaccines- Shingrix (1 of 2) Never done   Pneumonia Vaccine 66 Years old (1 - PCV) Never done    Colorectal cancer screening: Type of screening: Colonoscopy. Completed 01/11/20. Repeat every 10 years  Lung Cancer Screening: (Low Dose CT Chest recommended if Age 67-80years, 30 pack-year currently smoking OR have quit w/in 15years.) does not qualify.   Additional Screening:  Hepatitis C Screening: does qualify; Completed 02/21/18  Vision Screening: Recommended annual ophthalmology exams for early detection of glaucoma and other disorders of the eye. Is  the patient up to date with their annual eye exam?  Yes  Who is the provider or what is the name of the office in which the patient attends annual eye exams? Dr.Bell If pt is not established with a provider, would they like to be referred to a provider to establish care? No .   Dental Screening: Recommended annual dental exams for proper oral hygiene  Community Resource Referral / Chronic Care Management: CRR required this visit?  No   CCM required this visit?  No      Plan:     I have personally reviewed and noted the following in the patient's chart:   Medical and social history Use of alcohol, tobacco or illicit drugs  Current medications and supplements including opioid prescriptions. Patient is not currently taking opioid prescriptions. Functional ability and status Nutritional status Physical activity Advanced directives List of other physicians Hospitalizations, surgeries, and ER visits in previous 12 months Vitals Screenings to include cognitive, depression, and falls Referrals and appointments  In addition, I have reviewed and discussed with patient certain preventive protocols, quality metrics, and best practice recommendations. A written personalized care plan for preventive services as well as general preventive health recommendations were provided to patient.     LDionisio David LPN   43/29/1917  Nurse Notes: none

## 2021-04-22 ENCOUNTER — Other Ambulatory Visit: Payer: Self-pay | Admitting: Physician Assistant

## 2021-04-22 DIAGNOSIS — N401 Enlarged prostate with lower urinary tract symptoms: Secondary | ICD-10-CM

## 2021-04-23 NOTE — Telephone Encounter (Signed)
Proscar 5 mg refused because being requested too early.    ?

## 2021-06-15 NOTE — Progress Notes (Signed)
   I,Sha'taria Tyson,acting as a scribe for Lindsay Drubel, PA-C.,have documented all relevant documentation on the behalf of Lindsay Drubel, PA-C,as directed by  Lindsay Drubel, PA-C while in the presence of Lindsay Drubel, PA-C.   Established patient visit   Patient: [name]   DOB: 04/20/1955   65 y.o. Male  MRN: [id number] Visit Date: 06/16/2021  Today's healthcare provider: Lindsay Drubel, PA-C   Cc. Chronic follow up  Subjective    HPI  BPH -Denies new symptoms. Consistent w medication.   --------------------------------------------------------------------------------------------------- Hypertension, follow-up  BP Readings from Last 3 Encounters:  06/16/21 134/80  02/09/21 132/76  01/16/21 120/80   Wt Readings from Last 3 Encounters:  06/16/21 228 lb 1.6 oz (103.5 kg)  04/14/21 225 lb (102.1 kg)  02/09/21 225 lb (102.1 kg)     He was last seen for hypertension 5 months ago.  BP at that visit was 120/80. Management since that visit includes managed by cariology. Monitor at home.  He reports excellent compliance with treatment. He is not having side effects.  He is following a Regular diet. He is not exercising. He does not smoke.  Use of agents associated with hypertension: none.   Outside blood pressures are not being checked regularly, but when they are pt reports 120s-130s/80s-90s. Symptoms: No chest pain No chest pressure  No palpitations No syncope  No dyspnea No orthopnea  No paroxysmal nocturnal dyspnea No lower extremity edema   Pertinent labs Lab Results  Component Value Date   CHOL 145 01/16/2021   HDL 41 01/16/2021   LDLCALC 83 01/16/2021   TRIG 115 01/16/2021   CHOLHDL 3.5 01/16/2021   Lab Results  Component Value Date   NA 139 01/16/2021   K 4.0 01/16/2021   CREATININE 1.10 01/16/2021   EGFR 74 01/16/2021   GLUCOSE 100 (H) 01/16/2021   TSH 1.790 05/17/2019     The 10-year ASCVD risk score (Arnett DK, et al., 2019) is:  15.5%  ---------------------------------------------------------------------------------------------------   Medications: Outpatient Medications Prior to Visit  Medication Sig   azelastine (ASTELIN) 0.1 % nasal spray Place 2 sprays into both nostrils 2 (two) times daily. Use in each nostril as directed   cetirizine (ZYRTEC) 10 MG tablet Take 10 mg by mouth every evening.   Cholecalciferol (VITAMIN D3) 1.25 MG (50000 UT) CAPS Vitamin D3   CRESTOR 20 MG tablet TAKE 1 TABLET BY MOUTH EVERY DAY   fluticasone (FLONASE) 50 MCG/ACT nasal spray fluticasone propionate 50 mcg/actuation nasal spray,suspension  SPRAY TWICE IN EACH NOSTRIL ONCE DAILY   losartan-hydrochlorothiazide (HYZAAR) 100-12.5 MG tablet Take 1 tablet by mouth daily.   metoprolol tartrate (LOPRESSOR) 25 MG tablet metoprolol tartrate 25 mg tablet   pantoprazole (PROTONIX) 40 MG tablet TAKE 1 TABLET (40 MG TOTAL) BY MOUTH ONCE DAILY. ONE HOUR BEFORE A MEAL.   [DISCONTINUED] finasteride (PROSCAR) 5 MG tablet Take 1 tablet (5 mg total) by mouth daily.   [DISCONTINUED] fluticasone (FLONASE) 50 MCG/ACT nasal spray SPRAY TWICE IN EACH NOSTRIL ONCE DAILY   [DISCONTINUED] losartan-hydrochlorothiazide (HYZAAR) 100-12.5 MG per tablet    [DISCONTINUED] metoprolol succinate (TOPROL-XL) 50 MG 24 hr tablet TAKE 1 TABLET (50 MG TOTAL) BY MOUTH ONCE DAILY.   [DISCONTINUED] tamsulosin (FLOMAX) 0.4 MG CAPS capsule Take 1 capsule (0.4 mg total) by mouth daily. Please schedule an office visit before anymore refills.   aspirin 325 MG tablet daily. (Patient not taking: Reported on 04/14/2021)   hydrALAZINE (APRESOLINE) 25 MG tablet hydralazine 25 mg tablet (Patient   not taking: Reported on 04/14/2021)   [DISCONTINUED] amoxicillin-clavulanate (AUGMENTIN) 875-125 MG tablet Take 1 tablet by mouth every 12 (twelve) hours. (Patient not taking: Reported on 04/14/2021)   [DISCONTINUED] aspirin EC 325 MG EC tablet Take 1 tablet (325 mg total) by mouth daily. (Patient  not taking: Reported on 04/14/2021)   [DISCONTINUED] cholecalciferol (VITAMIN D) 1000 UNITS tablet Take 2,000 Units by mouth every evening. (Patient not taking: Reported on 04/14/2021)   [DISCONTINUED] HYDROcodone-homatropine (HYCODAN) 5-1.5 MG/5ML syrup Take 5 mLs by mouth every 8 (eight) hours as needed for cough. (Patient not taking: Reported on 04/14/2021)   [DISCONTINUED] meloxicam (MOBIC) 15 MG tablet meloxicam 15 mg tablet  TAKE 1 TABLET BY MOUTH EVERY DAY (Patient not taking: Reported on 04/14/2021)   [DISCONTINUED] Na Sulfate-K Sulfate-Mg Sulf 17.5-3.13-1.6 GM/177ML SOLN Take by mouth.   [DISCONTINUED] omeprazole (PRILOSEC) 40 MG capsule omeprazole 40 mg capsule,delayed release (Patient not taking: Reported on 04/14/2021)   No facility-administered medications prior to visit.    Review of Systems  Constitutional:  Negative for fatigue and fever.  Respiratory:  Negative for cough and shortness of breath.   Cardiovascular:  Negative for chest pain, palpitations and leg swelling.  Neurological:  Negative for dizziness and headaches.       Objective    Blood pressure 134/80, pulse 65, weight 228 lb 1.6 oz (103.5 kg), SpO2 96 %.   Physical Exam Constitutional:      General: He is awake.     Appearance: He is well-developed.  HENT:     Head: Normocephalic.  Eyes:     Conjunctiva/sclera: Conjunctivae normal.  Cardiovascular:     Rate and Rhythm: Normal rate and regular rhythm.     Heart sounds: Normal heart sounds.  Pulmonary:     Effort: Pulmonary effort is normal.     Breath sounds: Normal breath sounds.  Skin:    General: Skin is warm.  Neurological:     Mental Status: He is alert and oriented to person, place, and time.  Psychiatric:        Attention and Perception: Attention normal.        Mood and Affect: Mood normal.        Speech: Speech normal.        Behavior: Behavior is cooperative.     No results found for any visits on 06/16/21.  Assessment & Plan      Problem List Items Addressed This Visit       Cardiovascular and Mediastinum   Essential (primary) hypertension - Primary    Chronic and well controlled Continue current medications Reviewed last cmp Discussed diet/exercise        Other   BPH associated with nocturia    No change in symptoms Reviewed last psa Refilled meds      Relevant Medications   finasteride (PROSCAR) 5 MG tablet   tamsulosin (FLOMAX) 0.4 MG CAPS capsule     Return in about 6 months (around 12/16/2021) for CPE.      I, Lindsay Drubel, PA-C have reviewed all documentation for this visit. The documentation on  06/16/2021  for the exam, diagnosis, procedures, and orders are all accurate and complete.  Lindsay Drubel, PA-C [facility] [address] [place], [place], [address] Office: [phone] Fax: [phone]   [facility]  

## 2021-06-16 ENCOUNTER — Ambulatory Visit (INDEPENDENT_AMBULATORY_CARE_PROVIDER_SITE_OTHER): Payer: PPO | Admitting: Physician Assistant

## 2021-06-16 ENCOUNTER — Encounter: Payer: Self-pay | Admitting: Physician Assistant

## 2021-06-16 VITALS — BP 134/80 | HR 65 | Wt 228.1 lb

## 2021-06-16 DIAGNOSIS — I10 Essential (primary) hypertension: Secondary | ICD-10-CM

## 2021-06-16 DIAGNOSIS — R351 Nocturia: Secondary | ICD-10-CM | POA: Diagnosis not present

## 2021-06-16 DIAGNOSIS — N401 Enlarged prostate with lower urinary tract symptoms: Secondary | ICD-10-CM

## 2021-06-16 MED ORDER — TAMSULOSIN HCL 0.4 MG PO CAPS: 0.4000 mg | ORAL_CAPSULE | Freq: Every day | ORAL | 1 refills | Status: DC

## 2021-06-16 MED ORDER — FINASTERIDE 5 MG PO TABS: 5.0000 mg | ORAL_TABLET | Freq: Every day | ORAL | 1 refills | Status: DC

## 2021-06-16 NOTE — Assessment & Plan Note (Signed)
No change in symptoms Reviewed last psa Refilled meds

## 2021-06-16 NOTE — Assessment & Plan Note (Addendum)
Chronic and well controlled Continue current medications Reviewed last cmp Discussed diet/exercise

## 2021-09-16 ENCOUNTER — Ambulatory Visit: Payer: PPO | Attending: Cardiovascular Disease | Admitting: Cardiovascular Disease

## 2021-09-16 ENCOUNTER — Encounter: Payer: Self-pay | Admitting: Cardiovascular Disease

## 2021-09-16 VITALS — BP 118/66 | HR 62 | Ht 67.0 in | Wt 225.1 lb

## 2021-09-16 DIAGNOSIS — I10 Essential (primary) hypertension: Secondary | ICD-10-CM | POA: Diagnosis not present

## 2021-09-16 DIAGNOSIS — E782 Mixed hyperlipidemia: Secondary | ICD-10-CM | POA: Diagnosis not present

## 2021-09-16 DIAGNOSIS — I25708 Atherosclerosis of coronary artery bypass graft(s), unspecified, with other forms of angina pectoris: Secondary | ICD-10-CM

## 2021-09-16 DIAGNOSIS — G4733 Obstructive sleep apnea (adult) (pediatric): Secondary | ICD-10-CM | POA: Diagnosis not present

## 2021-09-16 MED ORDER — METOPROLOL SUCCINATE ER 50 MG PO TB24: 50.0000 mg | ORAL_TABLET | Freq: Every day | ORAL | 3 refills | Status: DC

## 2021-09-16 MED ORDER — EZETIMIBE 10 MG PO TABS: 10.0000 mg | ORAL_TABLET | Freq: Every day | ORAL | 3 refills | Status: DC

## 2021-09-16 MED ORDER — LOSARTAN POTASSIUM-HCTZ 100-12.5 MG PO TABS: 1.0000 | ORAL_TABLET | Freq: Every day | ORAL | 3 refills | Status: DC

## 2021-09-16 NOTE — Patient Instructions (Addendum)
Medication Instructions:  Please add zetia 10 mg daily  If you need a refill on your cardiac medications before your next appointment, please call your pharmacy.   Lab work: No new labs needed  Testing/Procedures: No new testing needed  Follow-Up: At [facility], you and your health needs are our priority.  As part of our continuing mission to provide you with exceptional heart care, we have created designated Provider Care Teams.  These Care Teams include your primary Cardiologist (physician) and Advanced Practice Providers (APPs -  Physician Assistants and Nurse Practitioners) who all work together to provide you with the care you need, when you need it.  You will need a follow up appointment in 6 months, APP ok  Providers on your designated Care Team:   Murray Hodgkins, NP Christell Faith, PA-C Cadence Kathlen Mody, [place]  COVID-19 Vaccine Information can be found at: [email or web address] For questions related to vaccine distribution or appointments, please email [email or web address] or call [phone].

## 2021-09-16 NOTE — Progress Notes (Signed)
Cardiology Office Note  Date:  09/16/2021   ID:  [name], DOB 1955/09/23, MRN [id number]  PCP:  Thomas Kirschner, PA-C   Chief Complaint  Patient presents with   New Patient (Initial Visit)    Self ref to establish care for CAD/s/p CABG x 4 on 06/06/2012. Medications reviewed by the patient verbally.     HPI:  [name] is a 66 y.o.male patient with PMH of  Smoking, quit 2014 HTN hyperlipidemia CAD, CABG in 2014, LIMA to LAD, SVG to first intermediate, SVG to second intermediate, SVG to distal RCA.. Who presents for new patient evaluation of his coronary artery disease  On his visit today reports feeling well, denies symptoms concerning for angina Prior anginal symptoms, was having nausea Additional work-up in 2014, had cardiac catheterization  Cardiac catheterization is notable for the presence of 99% stenosis of the left main coronary artery with 100% chronic occlusion of the right coronary artery and left to right collaterals filling of the distal right corner circulation. Left ventricular function was preserved. The patient was transferred directly to [facility] for urgent coronary artery bypass grafting.    Over the past 9 years reports having no significant cardiac issues Denies having anginal symptoms Works part-time at [employer] blood pressure stable  Lab work reviewed A1c 5.9 Total cholesterol 145 LDL 83 Creatinine 1.1, normal BMP, LFTs  Stress test August 2022: No significant ischemia, EF 57%  Echo 2019 NORMAL LEFT VENTRICULAR SYSTOLIC FUNCTION  NORMAL RIGHT VENTRICULAR SYSTOLIC FUNCTION  MILD VALVULAR REGURGITATION (See above)  NO VALVULAR STENOSIS  Closest EF: >55% (Estimated)  Tricuspid: MILD TR  Mitral: MILD MR   EKG personally reviewed by myself on todays visit Normal sinus rhythm rate 62 bpm, old inferior MI, nonspecific ST-T wave abnormality  PMH:   has a past medical history of Allergy, Anginal pain ([place]),  Anxiety, Chronic kidney disease, Colon polyp (2008), Coronary artery disease, Essential hypertension, benign (06/05/2012), GERD (gastroesophageal reflux disease), History of herpes zoster, Hypercholesterolemia, Hyperlipidemia (06/05/2012), Hypertension, Left main coronary artery disease (06/05/2012), Myocardial infarction ([place]), Obesity (BMI 30-39.9) (06/05/2012), Obstructive sleep apnea (06/05/2012), Sleep apnea, Smoker, Tobacco abuse (06/05/2012), and Vitamin D deficiency.  PSH:    Past Surgical History:  Procedure Laterality Date   BASAL CELL CARCINOMA EXCISION  1997   CARDIAC CATHETERIZATION     COLONOSCOPY N/A 01/11/2020   Procedure: COLONOSCOPY;  Surgeon: Lesly Rubenstein, MD;  Location: [facility] ENDOSCOPY;  Service: Endoscopy;  Laterality: N/A;   COLONOSCOPY WITH PROPOFOL N/A 06/10/2015   Procedure: COLONOSCOPY WITH PROPOFOL;  Surgeon: Lollie Sails, MD;  Location: [facility] ENDOSCOPY;  Service: Endoscopy;  Laterality: N/A;   CORONARY ARTERY BYPASS GRAFT N/A 06/06/2012   Procedure: CORONARY ARTERY BYPASS GRAFTING (CABG);  Surgeon: Grace Isaac, MD;  Location: [place];  Service: Open Heart Surgery;  Laterality: N/A;  x4 using right greater saphenous vein and left internal mammary.    ENDOVEIN HARVEST OF GREATER SAPHENOUS VEIN Right 06/06/2012   Procedure: ENDOVEIN HARVEST OF GREATER SAPHENOUS VEIN;  Surgeon: Grace Isaac, MD;  Location: [place];  Service: Open Heart Surgery;  Laterality: Right;   ESOPHAGOGASTRODUODENOSCOPY (EGD) WITH PROPOFOL N/A 06/10/2015   Procedure: ESOPHAGOGASTRODUODENOSCOPY (EGD) WITH PROPOFOL;  Surgeon: Lollie Sails, MD;  Location: [facility] ENDOSCOPY;  Service: Endoscopy;  Laterality: N/A;   INTRAOPERATIVE TRANSESOPHAGEAL ECHOCARDIOGRAM N/A 06/06/2012   Procedure: INTRAOPERATIVE TRANSESOPHAGEAL ECHOCARDIOGRAM;  Surgeon: Grace Isaac, MD;  Location: [place];  Service: Open Heart Surgery;  Laterality: N/A;   LITHOTRIPSY  01/11/2008   TONSILLECTOMY      Current Outpatient  Medications  Medication Sig Dispense Refill   aspirin EC (ASPIRIN 81) 81 MG tablet Take 81 mg by mouth daily. Swallow whole.     azelastine (ASTELIN) 0.1 % nasal spray Place 2 sprays into both nostrils 2 (two) times daily. Use in each nostril as directed 30 mL 2   cetirizine (ZYRTEC) 10 MG tablet Take 10 mg by mouth every evening.     Cholecalciferol (VITAMIN D3) 1.25 MG (50000 UT) CAPS Vitamin D3     CRESTOR 20 MG tablet TAKE 1 TABLET BY MOUTH EVERY DAY 30 tablet 6   finasteride (PROSCAR) 5 MG tablet Take 1 tablet (5 mg total) by mouth daily. 90 tablet 1   fluticasone (FLONASE) 50 MCG/ACT nasal spray fluticasone propionate 50 mcg/actuation nasal spray,suspension  SPRAY TWICE IN EACH NOSTRIL ONCE DAILY     pantoprazole (PROTONIX) 20 MG tablet Take 20 mg by mouth daily.     tamsulosin (FLOMAX) 0.4 MG CAPS capsule Take 1 capsule (0.4 mg total) by mouth daily. 90 capsule 1   ezetimibe (ZETIA) 10 MG tablet Take 1 tablet (10 mg total) by mouth daily. 90 tablet 3   losartan-hydrochlorothiazide (HYZAAR) 100-12.5 MG tablet Take 1 tablet by mouth daily. 90 tablet 3   metoprolol succinate (TOPROL-XL) 50 MG 24 hr tablet Take 1 tablet (50 mg total) by mouth daily. Take with or immediately following a meal. 90 tablet 3   No current facility-administered medications for this visit.    Allergies:   Sulfa antibiotics   Social History:  The patient  reports that he quit smoking about 9 years ago. His smoking use included cigarettes. He has a 55.50 pack-year smoking history. He has never used smokeless tobacco. He reports that he does not drink alcohol and does not use drugs.   Family History:   family history includes Hypertension in an other family member.    Review of Systems: Review of Systems  Constitutional: Negative.   HENT: Negative.    Respiratory: Negative.    Cardiovascular: Negative.   Gastrointestinal: Negative.   Musculoskeletal: Negative.   Neurological: Negative.    Psychiatric/Behavioral: Negative.    All other systems reviewed and are negative.    PHYSICAL EXAM: VS:  BP 118/66 (BP Location: Left Arm, Patient Position: Sitting, Cuff Size: Normal)   Pulse 62   Ht '5\' 7"'$  (1.702 m)   Wt 225 lb 2 oz (102.1 kg)   SpO2 98%   BMI 35.26 kg/m  , BMI Body mass index is 35.26 kg/m. GEN: Well nourished, well developed, in no acute distress HEENT: normal Neck: no JVD, carotid bruits, or masses Cardiac: RRR; no murmurs, rubs, or gallops,no edema  Respiratory:  clear to auscultation bilaterally, normal work of breathing GI: soft, nontender, nondistended, + BS MS: no deformity or atrophy Skin: warm and dry, no rash Neuro:  Strength and sensation are intact Psych: euthymic mood, full affect   Recent Labs: 01/16/2021: ALT 17; BUN 15; Creatinine, Ser 1.10; Potassium 4.0; Sodium 139    Lipid Panel Lab Results  Component Value Date   CHOL 145 01/16/2021   HDL 41 01/16/2021   LDLCALC 83 01/16/2021   TRIG 115 01/16/2021      Wt Readings from Last 3 Encounters:  09/16/21 225 lb 2 oz (102.1 kg)  06/16/21 228 lb 1.6 oz (103.5 kg)  04/14/21 225 lb (102.1 kg)  ASSESSMENT AND PLAN:  Problem List Items Addressed This Visit       Cardiology Problems   Hyperlipidemia (Chronic)   Relevant Medications   aspirin EC (ASPIRIN 81) 81 MG tablet   metoprolol succinate (TOPROL-XL) 50 MG 24 hr tablet   losartan-hydrochlorothiazide (HYZAAR) 100-12.5 MG tablet   ezetimibe (ZETIA) 10 MG tablet   Other Relevant Orders   EKG 12-Lead   Essential (primary) hypertension   Relevant Medications   aspirin EC (ASPIRIN 81) 81 MG tablet   metoprolol succinate (TOPROL-XL) 50 MG 24 hr tablet   losartan-hydrochlorothiazide (HYZAAR) 100-12.5 MG tablet   ezetimibe (ZETIA) 10 MG tablet   Other Relevant Orders   EKG 12-Lead   Coronary artery disease - Primary   Relevant Medications   aspirin EC (ASPIRIN 81) 81 MG tablet   metoprolol succinate (TOPROL-XL) 50 MG  24 hr tablet   losartan-hydrochlorothiazide (HYZAAR) 100-12.5 MG tablet   ezetimibe (ZETIA) 10 MG tablet   Other Relevant Orders   EKG 12-Lead     Other   Obstructive sleep apnea (Chronic)   Relevant Orders   EKG 12-Lead   Coronary artery disease with stable angina Left main disease on catheterization 2014, occluded RCA with collaterals left to right Underwent CABG in [place] Denies anginal symptoms, no further testing needed at this time  Hyperlipidemia Continue Crestor 20 daily, Rec that he add Zetia 10 mg daily for goal LDL less than 55  Essential hypertension Blood pressure is well controlled on today's visit. No changes made to the medications.  Continue metoprolol, losartan HCTZ Refills provided  Prediabetes A1c relatively well controlled 5.9 We have encouraged continued exercise, careful diet management in an effort to lose weight.     Total encounter time more than 50 minutes  Greater than 50% was spent in counseling and coordination of care with the patient    Signed, Esmond Plants, M.D., Ph.D. [place], [place]

## 2021-12-16 ENCOUNTER — Encounter: Payer: PPO | Admitting: Physician Assistant

## 2021-12-18 ENCOUNTER — Ambulatory Visit (INDEPENDENT_AMBULATORY_CARE_PROVIDER_SITE_OTHER): Payer: PPO | Admitting: Physician Assistant

## 2021-12-18 ENCOUNTER — Encounter: Payer: Self-pay | Admitting: Physician Assistant

## 2021-12-18 VITALS — BP 119/72 | HR 64 | Temp 97.9°F | Resp 16 | Ht 66.0 in | Wt 221.0 lb

## 2021-12-18 DIAGNOSIS — D582 Other hemoglobinopathies: Secondary | ICD-10-CM | POA: Diagnosis not present

## 2021-12-18 DIAGNOSIS — E782 Mixed hyperlipidemia: Secondary | ICD-10-CM

## 2021-12-18 DIAGNOSIS — N401 Enlarged prostate with lower urinary tract symptoms: Secondary | ICD-10-CM

## 2021-12-18 DIAGNOSIS — Z0000 Encounter for general adult medical examination without abnormal findings: Secondary | ICD-10-CM | POA: Diagnosis not present

## 2021-12-18 DIAGNOSIS — J208 Acute bronchitis due to other specified organisms: Secondary | ICD-10-CM

## 2021-12-18 DIAGNOSIS — I10 Essential (primary) hypertension: Secondary | ICD-10-CM

## 2021-12-18 DIAGNOSIS — B9689 Other specified bacterial agents as the cause of diseases classified elsewhere: Secondary | ICD-10-CM

## 2021-12-18 DIAGNOSIS — I25810 Atherosclerosis of coronary artery bypass graft(s) without angina pectoris: Secondary | ICD-10-CM

## 2021-12-18 DIAGNOSIS — R351 Nocturia: Secondary | ICD-10-CM

## 2021-12-18 DIAGNOSIS — Z87891 Personal history of nicotine dependence: Secondary | ICD-10-CM

## 2021-12-18 DIAGNOSIS — R7303 Prediabetes: Secondary | ICD-10-CM

## 2021-12-18 DIAGNOSIS — G4733 Obstructive sleep apnea (adult) (pediatric): Secondary | ICD-10-CM

## 2021-12-18 MED ORDER — HYDROCOD POLI-CHLORPHE POLI ER 10-8 MG/5ML PO SUER: 5.0000 mL | Freq: Every evening | ORAL | 0 refills | Status: DC | PRN

## 2021-12-18 MED ORDER — AZITHROMYCIN 250 MG PO TABS: ORAL_TABLET | ORAL | 0 refills | Status: AC

## 2021-12-18 NOTE — Assessment & Plan Note (Addendum)
Historically. Will repeat cbc May be ass w/ osa

## 2021-12-18 NOTE — Assessment & Plan Note (Signed)
Chronic, well controlled Continue losartan [id number], metoprolol 50 mg  Ordered cmp F/u 6 mo

## 2021-12-18 NOTE — Assessment & Plan Note (Signed)
Pt has a 55 year pack history, quit 2014.  Advised he meets guidelines for LDCT for lung cancer screening. He has no interest

## 2021-12-18 NOTE — Assessment & Plan Note (Signed)
Increased LUTS Advised urology referral Will repeat psa given increase in symptoms

## 2021-12-18 NOTE — Assessment & Plan Note (Signed)
Historically will repeat a1c 

## 2021-12-18 NOTE — Assessment & Plan Note (Addendum)
Pt does not use cpap, was diagnosed but never went for fitting. No interested. Advised on long term consequences.

## 2021-12-18 NOTE — Assessment & Plan Note (Signed)
Pt manages with crestor 20 mg, takes asa 81, follows with cardiology. Repeat fasting lipids LDL goal < 70 The 10-year ASCVD risk score (Arnett DK, et al., 2019) is: 13.8%

## 2021-12-18 NOTE — Assessment & Plan Note (Signed)
Pt takes asa 81 mg, crestor 20 mg.  Repeat fasting lipids LDL goal < 70

## 2021-12-18 NOTE — Progress Notes (Signed)
Complete physical exam   Patient: [name]   DOB: 05/01/1955   66 y.o. Male  MRN: [id number] Visit Date: 12/18/2021  Today's healthcare provider: Mikey Kirschner, PA-C   I,Tiffany J Bragg,acting as a scribe for Mikey Kirschner, PA-C.,have documented all relevant documentation on the behalf of Mikey Kirschner, PA-C,as directed by  Mikey Kirschner, PA-C while in the presence of Mikey Kirschner, PA-C.   Chief Complaint  Patient presents with   Annual Exam   Cough    Patient complains of productive cough for a week.    Subjective    [name] is a 66 y.o. male who presents today for a complete physical exam.  He reports consuming a general diet. The patient does not participate in regular exercise at present. He generally feels well. He reports sleeping well. He does have additional problems to discuss today.  HPI  Pt reports a cough x 7 days, productive. Improved since it first started, associated with fatigue, nasal congestion. Denies fever. Reports taking mucinex over the counter.   Pt reports increased urinary issues-- dribbling, urgency. Despite medications. He has never followed with urology.  Past Medical History:  Diagnosis Date   Allergy    Anginal pain ([place])    Anxiety    Chronic kidney disease    kidney stones   Colon polyp 2008   Colonoscopy due to repeat in 2011 due to polyps   Coronary artery disease    Essential hypertension, benign 06/05/2012   GERD (gastroesophageal reflux disease)    History of herpes zoster    Hypercholesterolemia    Hyperlipidemia 06/05/2012   Hypertension    Left main coronary artery disease 06/05/2012   Myocardial infarction ([place])    Obesity (BMI 30-39.9) 06/05/2012   Obstructive sleep apnea 06/05/2012   Sleep apnea    Smoker    Tobacco abuse 06/05/2012   Vitamin D deficiency    Past Surgical History:  Procedure Laterality Date   BASAL CELL CARCINOMA EXCISION  1997   CARDIAC CATHETERIZATION     COLONOSCOPY N/A 01/11/2020    Procedure: COLONOSCOPY;  Surgeon: Lesly Rubenstein, MD;  Location: [facility] ENDOSCOPY;  Service: Endoscopy;  Laterality: N/A;   COLONOSCOPY WITH PROPOFOL N/A 06/10/2015   Procedure: COLONOSCOPY WITH PROPOFOL;  Surgeon: Lollie Sails, MD;  Location: [facility] ENDOSCOPY;  Service: Endoscopy;  Laterality: N/A;   CORONARY ARTERY BYPASS GRAFT N/A 06/06/2012   Procedure: CORONARY ARTERY BYPASS GRAFTING (CABG);  Surgeon: Grace Isaac, MD;  Location: [place];  Service: Open Heart Surgery;  Laterality: N/A;  x4 using right greater saphenous vein and left internal mammary.    ENDOVEIN HARVEST OF GREATER SAPHENOUS VEIN Right 06/06/2012   Procedure: ENDOVEIN HARVEST OF GREATER SAPHENOUS VEIN;  Surgeon: Grace Isaac, MD;  Location: [place];  Service: Open Heart Surgery;  Laterality: Right;   ESOPHAGOGASTRODUODENOSCOPY (EGD) WITH PROPOFOL N/A 06/10/2015   Procedure: ESOPHAGOGASTRODUODENOSCOPY (EGD) WITH PROPOFOL;  Surgeon: Lollie Sails, MD;  Location: [facility] ENDOSCOPY;  Service: Endoscopy;  Laterality: N/A;   INTRAOPERATIVE TRANSESOPHAGEAL ECHOCARDIOGRAM N/A 06/06/2012   Procedure: INTRAOPERATIVE TRANSESOPHAGEAL ECHOCARDIOGRAM;  Surgeon: Grace Isaac, MD;  Location: [place];  Service: Open Heart Surgery;  Laterality: N/A;   LITHOTRIPSY  01/11/2008   TONSILLECTOMY     Social History   Socioeconomic History   Marital status: Married    Spouse name: Not on file   Number of children: Not on file   Years of education: Not on file  Highest education level: Not on file  Occupational History   Occupation: retired  Tobacco Use   Smoking status: Former    Packs/day: 1.50    Years: 37.00    Total pack years: 55.50    Types: Cigarettes    Quit date: 06/05/2012    Years since quitting: 9.5   Smokeless tobacco: Never  Vaping Use   Vaping Use: Never used  Substance and Sexual Activity   Alcohol use: No   Drug use: No   Sexual activity: Not on file  Other Topics Concern   Not on file  Social History  Narrative   Not on file   Social Determinants of Health   Financial Resource Strain: Low Risk  (04/14/2021)   Overall Financial Resource Strain (CARDIA)    Difficulty of Paying Living Expenses: Not hard at all  Food Insecurity: No Food Insecurity (04/14/2021)   Hunger Vital Sign    Worried About Running Out of Food in the Last Year: Never true    [place] in the Last Year: Never true  Transportation Needs: No Transportation Needs (04/14/2021)   PRAPARE - Hydrologist (Medical): No    Lack of Transportation (Non-Medical): No  Physical Activity: Insufficiently Active (04/14/2021)   Exercise Vital Sign    Days of Exercise per Week: 2 days    Minutes of Exercise per Session: 20 min  Stress: No Stress Concern Present (04/14/2021)   [place]    Feeling of Stress : Not at all  Social Connections: [place] (04/14/2021)   Social Connection and Isolation Panel [NHANES]    Frequency of Communication with Friends and Family: More than three times a week    Frequency of Social Gatherings with Friends and Family: Twice a week    Attends Religious Services: More than 4 times per year    Active Member of [employer] or Organizations: Yes    Attends Archivist Meetings: Never    Marital Status: Married  Human resources officer Violence: Not At Risk (04/14/2021)   Humiliation, Afraid, Rape, and Kick questionnaire    Fear of Current or Ex-Partner: No    Emotionally Abused: No    Physically Abused: No    Sexually Abused: No   Family Status  Relation Name Status   Mother  Alive   Father MI Deceased   Brother  Alive   Other  (Not Specified)   Family History  Problem Relation Age of Onset   Hypertension Other    Allergies  Allergen Reactions   Sulfa Antibiotics     Other reaction(s): Unknown "Makes him feel worse then better when taken"per wife    Patient Care Team: Mikey Kirschner, PA-C as PCP - General (Physician Assistant) Teodoro Spray, MD as Attending Physician (Cardiology)   Medications: Outpatient Medications Prior to Visit  Medication Sig   aspirin EC (ASPIRIN 81) 81 MG tablet Take 81 mg by mouth daily. Swallow whole.   azelastine (ASTELIN) 0.1 % nasal spray Place 2 sprays into both nostrils 2 (two) times daily. Use in each nostril as directed   cetirizine (ZYRTEC) 10 MG tablet Take 10 mg by mouth every evening.   Cholecalciferol (VITAMIN D3) 1.25 MG (50000 UT) CAPS Vitamin D3   CRESTOR 20 MG tablet TAKE 1 TABLET BY MOUTH EVERY DAY   ezetimibe (ZETIA) 10 MG tablet Take 1 tablet (10 mg total) by mouth daily.   finasteride (  PROSCAR) 5 MG tablet Take 1 tablet (5 mg total) by mouth daily.   fluticasone (FLONASE) 50 MCG/ACT nasal spray fluticasone propionate 50 mcg/actuation nasal spray,suspension  SPRAY TWICE IN EACH NOSTRIL ONCE DAILY   losartan-hydrochlorothiazide (HYZAAR) 100-12.5 MG tablet Take 1 tablet by mouth daily.   metoprolol succinate (TOPROL-XL) 50 MG 24 hr tablet Take 1 tablet (50 mg total) by mouth daily. Take with or immediately following a meal.   pantoprazole (PROTONIX) 20 MG tablet Take 20 mg by mouth daily.   tamsulosin (FLOMAX) 0.4 MG CAPS capsule Take 1 capsule (0.4 mg total) by mouth daily.   No facility-administered medications prior to visit.    Review of Systems  HENT:  Positive for congestion, rhinorrhea, sinus pressure, sneezing and sore throat.   Eyes:  Positive for discharge and itching.  Respiratory:  Positive for cough.   Genitourinary:  Positive for frequency and urgency.    Objective    BP 119/72 (BP Location: Right Arm, Patient Position: Sitting, Cuff Size: Normal)   Pulse 64   Temp 97.9 F (36.6 C) (Oral)   Resp 16   Ht '5\' 6"'$  (1.676 m)   Wt 221 lb (100.2 kg)   SpO2 97%   BMI 35.67 kg/m     Physical Exam Constitutional:      General: He is awake.     Appearance: He is well-developed.  HENT:      Head: Normocephalic.     Right Ear: Tympanic membrane, ear canal and external ear normal.     Left Ear: Tympanic membrane, ear canal and external ear normal.  Eyes:     Pupils: Pupils are equal, round, and reactive to light.  Cardiovascular:     Rate and Rhythm: Normal rate and regular rhythm.     Heart sounds: Normal heart sounds.  Pulmonary:     Effort: Pulmonary effort is normal.     Breath sounds: Normal breath sounds.  Musculoskeletal:     Cervical back: Normal range of motion.     Right lower leg: No edema.     Left lower leg: No edema.  Lymphadenopathy:     Cervical: No cervical adenopathy.  Skin:    General: Skin is warm.  Neurological:     Mental Status: He is alert and oriented to person, place, and time.  Psychiatric:        Attention and Perception: Attention normal.        Mood and Affect: Mood normal.        Speech: Speech normal.        Behavior: Behavior normal. Behavior is cooperative.     Last depression screening scores    12/18/2021    8:54 AM 04/14/2021    9:05 AM 01/16/2021   10:00 AM  PHQ 2/9 Scores  PHQ - 2 Score 0 0 0  PHQ- 9 Score 0  0   Last fall risk screening    12/18/2021    8:54 AM  Fall Risk   Falls in the past year? 0  Number falls in past yr: 0  Injury with Fall? 0  Risk for fall due to : No Fall Risks  Follow up Falls evaluation completed   Last Audit-C alcohol use screening    12/18/2021    8:54 AM  Alcohol Use Disorder Test (AUDIT)  1. How often do you have a drink containing alcohol? 0  2. How many drinks containing alcohol do you have on a typical day when you are  drinking? 0  3. How often do you have six or more drinks on one occasion? 0  AUDIT-C Score 0   A score of 3 or more in women, and 4 or more in men indicates increased risk for alcohol abuse, EXCEPT if all of the points are from question 1   No results found for any visits on 12/18/21.  Assessment & Plan    Routine Health Maintenance and Physical  Exam  Exercise Activities and Dietary recommendations --balanced diet high in fiber and protein, low in sugars, carbs, fats. --physical activity/exercise 30 minutes 3-5 times a week     Immunization History  Administered Date(s) Administered   Tdap 12/21/2011    Health Maintenance  Topic Date Due   COVID-19 Vaccine (1) 01/03/2022 (Originally 05/17/1956)   Zoster Vaccines- Shingrix (1 of 2) 03/19/2022 (Originally 11/16/2004)   INFLUENZA VACCINE  04/04/2022 (Originally 08/04/2021)   Pneumonia Vaccine 15+ Years old (1 - PCV) 06/17/2022 (Originally 11/17/2019)   Lung Cancer Screening  12/19/2022 (Originally 11/16/2004)   DTaP/Tdap/Td (2 - Td or Tdap) 12/20/2021   Medicare Annual Wellness (AWV)  04/15/2022   COLONOSCOPY (Pts 45-46yr Insurance coverage will need to be confirmed)  01/10/2030   Hepatitis C Screening  Completed   HPV VACCINES  Aged Out    Discussed health benefits of physical activity, and encouraged him to engage in regular exercise appropriate for his age and condition.  Acute bronchitis, bacterial -given obesity, history of smoking, prolonged symptoms, rx zpack. Rx tussionex for cough qhs    Problem List Items Addressed This Visit       Cardiovascular and Mediastinum   Coronary artery disease    Pt takes asa 81 mg, crestor 20 mg.  Repeat fasting lipids LDL goal < 70      Essential (primary) hypertension    Chronic, well controlled Continue losartan [id number], metoprolol 50 mg  Ordered cmp F/u 6 mo       Relevant Orders   Comprehensive Metabolic Panel (CMET)     Respiratory   Obstructive sleep apnea (Chronic)    Pt does not use cpap, was diagnosed but never went for fitting. No interested. Advised on long term consequences.         Other   Hyperlipidemia (Chronic)    Pt manages with crestor 20 mg, takes asa 81, follows with cardiology. Repeat fasting lipids LDL goal < 70 The 10-year ASCVD risk score (Arnett DK, et al., 2019) is: 13.8%        Relevant Orders   Lipid panel   History of tobacco abuse    Pt has a 55 year pack history, quit 2014.  Advised he meets guidelines for LDCT for lung cancer screening. He has no interest      BPH associated with nocturia    Increased LUTS Advised urology referral Will repeat psa given increase in symptoms      Relevant Orders   PSA   Ambulatory referral to Urology   Elevated hemoglobin ([place]ogart    Historically. Will repeat cbc May be ass w/ osa      Relevant Orders   CBC w/Diff/Platelet   Prediabetes    Historically will repeat a1c      Relevant Orders   HgB A1c   Other Visit Diagnoses     Annual physical exam    -  Primary   Acute bacterial bronchitis       Relevant Medications   azithromycin (ZITHROMAX) 250 MG tablet   chlorpheniramine-HYDROcodone (TUSSIONEX)  10-8 MG/5ML       Pt declines vaccinations today, flu, pneumonia, covid, shingles.  Return in about 6 months (around 06/19/2022) for hypertension, chronic conditions.     I, Mikey Kirschner, PA-C have reviewed all documentation for this visit. The documentation on 12/18/2021  for the exam, diagnosis, procedures, and orders are all accurate and complete.  Mikey Kirschner, PA-C [facility] [id number] [place], [place], [address] Office: [phone] Fax: [place]

## 2021-12-19 LAB — LIPID PANEL
Chol/HDL Ratio: 2.8 ratio (ref 0.0–5.0)
Cholesterol, Total: 103 mg/dL (ref 100–199)
HDL: 37 mg/dL — ABNORMAL LOW
LDL Chol Calc (NIH): 46 mg/dL (ref 0–99)
Triglycerides: 104 mg/dL (ref 0–149)
VLDL Cholesterol Cal: 20 mg/dL (ref 5–40)

## 2021-12-19 LAB — COMPREHENSIVE METABOLIC PANEL WITH GFR
ALT: 22 IU/L (ref 0–44)
AST: 21 IU/L (ref 0–40)
Albumin/Globulin Ratio: 1.7 (ref 1.2–2.2)
Albumin: 4.4 g/dL (ref 3.9–4.9)
Alkaline Phosphatase: 89 IU/L (ref 44–121)
BUN/Creatinine Ratio: 19 (ref 10–24)
BUN: 22 mg/dL (ref 8–27)
Bilirubin Total: 1.5 mg/dL — ABNORMAL HIGH (ref 0.0–1.2)
CO2: 24 mmol/L (ref 20–29)
Calcium: 9.6 mg/dL (ref 8.6–10.2)
Chloride: 99 mmol/L (ref 96–106)
Creatinine, Ser: 1.14 mg/dL (ref 0.76–1.27)
Globulin, Total: 2.6 g/dL (ref 1.5–4.5)
Glucose: 103 mg/dL — ABNORMAL HIGH (ref 70–99)
Potassium: 3.7 mmol/L (ref 3.5–5.2)
Sodium: 140 mmol/L (ref 134–144)
Total Protein: 7 g/dL (ref 6.0–8.5)
eGFR: 70 mL/min/1.73

## 2021-12-19 LAB — CBC WITH DIFFERENTIAL/PLATELET
Basophils Absolute: 0.1 x10E3/uL (ref 0.0–0.2)
Basos: 1 %
EOS (ABSOLUTE): 0.2 x10E3/uL (ref 0.0–0.4)
Eos: 2 %
Hematocrit: 51.6 % — ABNORMAL HIGH (ref 37.5–51.0)
Hemoglobin: 17.7 g/dL (ref 13.0–17.7)
Immature Grans (Abs): 0 x10E3/uL (ref 0.0–0.1)
Immature Granulocytes: 0 %
Lymphocytes Absolute: 2.8 x10E3/uL (ref 0.7–3.1)
Lymphs: 25 %
MCH: 31.7 pg (ref 26.6–33.0)
MCHC: 34.3 g/dL (ref 31.5–35.7)
MCV: 93 fL (ref 79–97)
Monocytes Absolute: 0.7 x10E3/uL (ref 0.1–0.9)
Monocytes: 6 %
Neutrophils Absolute: 7.4 x10E3/uL — ABNORMAL HIGH (ref 1.4–7.0)
Neutrophils: 66 %
Platelets: 240 x10E3/uL (ref 150–450)
RBC: 5.58 x10E6/uL (ref 4.14–5.80)
RDW: 12.6 % (ref 11.6–15.4)
WBC: 11.2 x10E3/uL — ABNORMAL HIGH (ref 3.4–10.8)

## 2021-12-19 LAB — HEMOGLOBIN A1C
Est. average glucose Bld gHb Est-mCnc: 123 mg/dL
Hgb A1c MFr Bld: 5.9 % — ABNORMAL HIGH (ref 4.8–5.6)

## 2021-12-19 LAB — PSA: Prostate Specific Ag, Serum: 1.2 ng/mL (ref 0.0–4.0)

## 2021-12-22 ENCOUNTER — Encounter: Payer: Self-pay | Admitting: *Deleted

## 2021-12-22 ENCOUNTER — Other Ambulatory Visit: Payer: Self-pay

## 2021-12-22 DIAGNOSIS — D72829 Elevated white blood cell count, unspecified: Secondary | ICD-10-CM

## 2022-01-11 ENCOUNTER — Other Ambulatory Visit: Payer: Self-pay | Admitting: Physician Assistant

## 2022-01-11 DIAGNOSIS — N401 Enlarged prostate with lower urinary tract symptoms: Secondary | ICD-10-CM

## 2022-01-12 ENCOUNTER — Ambulatory Visit (INDEPENDENT_AMBULATORY_CARE_PROVIDER_SITE_OTHER): Payer: PPO | Admitting: Urology

## 2022-01-12 ENCOUNTER — Ambulatory Visit: Payer: PPO | Admitting: Urology

## 2022-01-12 ENCOUNTER — Encounter: Payer: Self-pay | Admitting: Urology

## 2022-01-12 VITALS — BP 140/83 | HR 71 | Ht 66.0 in | Wt 225.0 lb

## 2022-01-12 DIAGNOSIS — R350 Frequency of micturition: Secondary | ICD-10-CM | POA: Diagnosis not present

## 2022-01-12 DIAGNOSIS — R351 Nocturia: Secondary | ICD-10-CM

## 2022-01-12 DIAGNOSIS — N401 Enlarged prostate with lower urinary tract symptoms: Secondary | ICD-10-CM | POA: Diagnosis not present

## 2022-01-12 LAB — URINALYSIS, COMPLETE
Bilirubin, UA: NEGATIVE
Glucose, UA: NEGATIVE
Ketones, UA: NEGATIVE
Leukocytes,UA: NEGATIVE
Nitrite, UA: NEGATIVE
Protein,UA: NEGATIVE
Specific Gravity, UA: 1.025 (ref 1.005–1.030)
Urobilinogen, Ur: 1 mg/dL (ref 0.2–1.0)
pH, UA: 5.5 (ref 5.0–7.5)

## 2022-01-12 LAB — BLADDER SCAN AMB NON-IMAGING: Scan Result: 6

## 2022-01-12 LAB — MICROSCOPIC EXAMINATION
Bacteria, UA: NONE SEEN
Epithelial Cells (non renal): NONE SEEN /hpf (ref 0–10)

## 2022-01-12 NOTE — Progress Notes (Signed)
I, Jeanmarie Hubert Maxie,acting as a scribe for Hollice Espy, MD.,have documented all relevant documentation on the behalf of Hollice Espy, MD,as directed by  Hollice Espy, MD while in the presence of Hollice Espy, MD.   01/12/22 3:39 PM   Elvera Lennox [birth year] [id number]  Referring provider: Mikey Kirschner, PA-C [address] [place],  [place] [address]  Chief Complaint  Patient presents with   Establish Care   Nocturia    HPI: 67 year-old male who is here for further evaluation of urinary symptoms.  He was seen in December with increased urinary symptoms including dribbling and urgency despite medications. He has been on Flomax and Finasteride over a year which was started by his PCP.   His most recent PSA was 1.2 on 12/18/21.   He notes urgency has worsened. He drinks a lot of coffee and tea. He reports constipation more recently, but not an issue normally.  He has a history of kidney stones but has not had any recent issues.  This is in the very remote past and never required surgical intervention.   Results for orders placed or performed in visit on 01/12/22  Microscopic Examination   Urine  Result Value Ref Range   WBC, UA 0-5 0 - 5 /hpf   RBC, Urine 0-2 0 - 2 /hpf   Epithelial Cells (non renal) None seen 0 - 10 /hpf   Bacteria, UA None seen None seen/Few  Urinalysis, Complete  Result Value Ref Range   Specific Gravity, UA 1.025 1.005 - 1.030   pH, UA 5.5 5.0 - 7.5   Color, UA Orange Yellow   Appearance Ur Hazy (A) Clear   Leukocytes,UA Negative Negative   Protein,UA Negative Negative/Trace   Glucose, UA Negative Negative   Ketones, UA Negative Negative   RBC, UA Trace (A) Negative   Bilirubin, UA Negative Negative   Urobilinogen, Ur 1.0 0.2 - 1.0 mg/dL   Nitrite, UA Negative Negative   Microscopic Examination See below:   Bladder Scan (Post Void Residual) in office  Result Value Ref Range   Scan Result 6     IPSS     Row Name 01/12/22 1100          International Prostate Symptom Score   How often have you had the sensation of not emptying your bladder? About half the time     How often have you had to urinate less than every two hours? Less than 1 in 5 times     How often have you found you stopped and started again several times when you urinated? More than half the time     How often have you found it difficult to postpone urination? More than half the time     How often have you had a weak urinary stream? About half the time     How often have you had to strain to start urination? Less than 1 in 5 times     How many times did you typically get up at night to urinate? None     Total IPSS Score 16       Quality of Life due to urinary symptoms   If you were to spend the rest of your life with your urinary condition just the way it is now how would you feel about that? Mostly Satisfied              Score:  1-7 Mild 8-19 Moderate 20-35 Severe    PMH:  Past Medical History:  Diagnosis Date   Allergy    Anginal pain ([place])    Anxiety    Chronic kidney disease    kidney stones   Colon polyp 2008   Colonoscopy due to repeat in 2011 due to polyps   Coronary artery disease    Essential hypertension, benign 06/05/2012   GERD (gastroesophageal reflux disease)    History of herpes zoster    Hypercholesterolemia    Hyperlipidemia 06/05/2012   Hypertension    Left main coronary artery disease 06/05/2012   Myocardial infarction ([place])    Obesity (BMI 30-39.9) 06/05/2012   Obstructive sleep apnea 06/05/2012   Sleep apnea    Smoker    Tobacco abuse 06/05/2012   Vitamin D deficiency     Surgical History: Past Surgical History:  Procedure Laterality Date   BASAL CELL CARCINOMA EXCISION  1997   CARDIAC CATHETERIZATION     COLONOSCOPY N/A 01/11/2020   Procedure: COLONOSCOPY;  Surgeon: Lesly Rubenstein, MD;  Location: [facility] ENDOSCOPY;  Service: Endoscopy;  Laterality: N/A;   COLONOSCOPY WITH PROPOFOL N/A 06/10/2015    Procedure: COLONOSCOPY WITH PROPOFOL;  Surgeon: Lollie Sails, MD;  [facility]COPY;  Service: Endoscopy;  Laterality: N/A;   CORONARY ARTERY BYPASS GRAFT N/A 06/06/2012   Procedure: CORONARY ARTERY BYPASS GRAFTING (CABG);  Surgeon: Grace Isaac, MD;  Location: [place];  Service: Open Heart Surgery;  Laterality: N/A;  x4 using right greater saphenous vein and left internal mammary.    ENDOVEIN HARVEST OF GREATER SAPHENOUS VEIN Right 06/06/2012   Procedure: ENDOVEIN HARVEST OF GREATER SAPHENOUS VEIN;  Surgeon: Grace Isaac, MD;  Location: [place];  Service: Open Heart Surgery;  Laterality: Right;   ESOPHAGOGASTRODUODENOSCOPY (EGD) WITH PROPOFOL N/A 06/10/2015   Procedure: ESOPHAGOGASTRODUODENOSCOPY (EGD) WITH PROPOFOL;  Surgeon: Lollie Sails, MD;  Location[facility] END[facility]COPY;  Service: Endoscopy;  Laterality: N/A;   INTRAOPERATIVE TRANSESOPHAGEAL ECHOCARDIOGRAM N/A 06/06/2012   Procedure: INTRAOPERATIVE TRANSESOPHAGEAL ECHOCARDIOGRAM;  Surgeon: Grace Isaac, MD;  Location: [place];  Service: Open Heart Surgery;  Laterality: N/A;   LITHOTRIPSY  01/11/2008   TONSILLECTOMY      Home Medications:  Allergies as of 01/12/2022       Reactions   Sulfa Antibiotics    Other reaction(s): Unknown "Makes him feel worse then better when taken"per wife        Medication List        Accurate as of January 12, 2022 11:59 PM. If you have any questions, ask your nurse or doctor.          Aspirin 81 81 MG tablet Generic drug: aspirin EC Take 81 mg by mouth daily. Swallow whole.   azelastine 0.1 % nasal spray Commonly known as: ASTELIN Place 2 sprays into both nostrils 2 (two) times daily. Use in each nostril as directed   cetirizine 10 MG tablet Commonly known as: ZYRTEC Take 10 mg by mouth every evening.   chlorpheniramine-HYDROcodone 10-8 MG/5ML Commonly known as: TUSSIONEX Take 5 mLs by mouth at bedtime as needed for cough.   Crestor 20 MG tablet Generic drug:  rosuvastatin TAKE 1 TABLET BY MOUTH EVERY DAY   ezetimibe 10 MG tablet Commonly known as: ZETIA Take 1 tablet (10 mg total) by mouth daily.   finasteride 5 MG tablet Commonly known as: PROSCAR TAKE 1 TABLET BY MOUTH DAILY   fluticasone 50 MCG/ACT nasal spray Commonly known as: FLONASE fluticasone propionate 50 mcg/actuation nasal spray,suspension  SPRAY TWICE IN [facility]  NOSTRIL ONCE DAILY   losartan-hydrochlorothiazide 100-12.5 MG tablet Commonly known as: HYZAAR Take 1 tablet by mouth daily.   metoprolol succinate 50 MG 24 hr tablet Commonly known as: TOPROL-XL Take 1 tablet (50 mg total) by mouth daily. Take with or immediately following a meal.   pantoprazole 20 MG tablet Commonly known as: PROTONIX Take 20 mg by mouth daily.   tamsulosin 0.4 MG Caps capsule Commonly known as: FLOMAX Take 1 capsule (0.4 mg total) by mouth daily.   Vitamin D3 1.25 MG (50000 UT) Caps Vitamin D3        Allergies:  Allergies  Allergen Reactions   Sulfa Antibiotics     Other reaction(s): Unknown "Makes him feel worse then better when taken"per wife    Family History: Family History  Problem Relation Age of Onset   Hypertension Other     Social History:  reports that he quit smoking about 9 years ago. His smoking use included cigarettes. He has a 55.50 pack-year smoking history. He has never used smokeless tobacco. He reports that he does not drink alcohol and does not use drugs.   Physical Exam: BP (!) 140/83   Pulse 71   Ht '5\' 6"'$  (1.676 m)   Wt 225 lb (102.1 kg)   BMI 36.32 kg/m   Constitutional:  Alert and oriented, No acute distress. HEENT: [place] AT, moist mucus membranes.  Trachea midline, no masses. GU: Normal sphincter tone, no nodules, non tender.  Due to habitus, only was able to palpate the apex of the prostate which was normal. Neurologic: Grossly intact, no focal deficits, moving all 4 extremities. Psychiatric: Normal mood and affect.  Assessment & Plan:     BPH with urinary frequency  -  We discussed the difference between obstructive vs irritative urinary symptoms. -Symptoms are primarily irritative.  -He likely has a large behavioral component, drinks primarily coffee and tea. He'll modify his behaviors, if his symptoms fail to improve, consider procedural approach to address his prostate, see if his urinary symptoms improve, which would necessitate cysto/TRUS vs a trial of OAB type medication.  -He'll reach out to [place] after he implements his behavioral modification. T -Urinalysis is negative, he is emptying adequately and no other warning symptoms such suggest underlying pathology - His rectal exam today was unremarkable. His PSA is normal.  - He can continue Flomax and Finasteride. These are chronic medications for him and likely helping with his obstructive urinary symptoms.  He will call us and let us know how he is done with behavioral modification.  He may consider cystoscopy TRUS versus the addition of anticholinergic or beta 3 agonist.  I have written out instructions for him.  I have reviewed the above documentation for accuracy and completeness, and I agree with the above.   Hollice Espy, MD  [facility] [address], [place], [place] [address] [phone]

## 2022-01-12 NOTE — Patient Instructions (Signed)
We discussed behavioral modification today to address severe irritative urinary symptoms including cutting back on coffee and tea  After about a month of implementing this behavior change, if you continue to have urinary symptoms we would like to see you back in the office.  If you like to consider addressing your prostate via procedural approach, please schedule cystoscopy/transrectal ultrasound.  If you would like to try an overactive bladder medication for urgency frequency symptoms, let us know and we can prescribe something.

## 2022-02-08 ENCOUNTER — Telehealth: Payer: Self-pay | Admitting: Cardiovascular Disease

## 2022-02-08 MED ORDER — ROSUVASTATIN CALCIUM 20 MG PO TABS: 20.0000 mg | ORAL_TABLET | Freq: Every day | ORAL | 3 refills | Status: DC

## 2022-02-08 NOTE — Telephone Encounter (Signed)
Per last office visit on 09/16/21, Dr. Rockey Situ recommended to continue with Crestor 20 mg daily.  New refill sent to requested pharmacy. Pt made aware

## 2022-02-08 NOTE — Telephone Encounter (Signed)
Pt c/o medication issue:  1. Name of Medication: rosuvastatin 20 mg  2. How are you currently taking this medication (dosage and times per day)? 1 tablet daily  3. Are you having a reaction (difficulty breathing--STAT)? no  4. What is your medication issue? Patient states he is running out of rosuvastatin and would like to know if Dr. Rockey Situ would refill it.

## 2022-03-08 ENCOUNTER — Telehealth: Payer: Self-pay | Admitting: Physician Assistant

## 2022-03-08 ENCOUNTER — Other Ambulatory Visit: Payer: Self-pay

## 2022-03-08 DIAGNOSIS — N401 Enlarged prostate with lower urinary tract symptoms: Secondary | ICD-10-CM

## 2022-03-08 MED ORDER — TAMSULOSIN HCL 0.4 MG PO CAPS: 0.4000 mg | ORAL_CAPSULE | Freq: Every day | ORAL | 1 refills | Status: DC

## 2022-03-08 NOTE — Telephone Encounter (Signed)
[facility] can [facility] accept E[facility].    They need refill authorizations on Tamsulosin HCL 0.4 mg. #90

## 2022-03-14 NOTE — Progress Notes (Unsigned)
Cardiology Office Note  Date:  03/15/2022   ID:  [name], DOB [birth year], MRN [id number]  PCP:  Mikey Kirschner, PA-C   Chief Complaint  Patient presents with   6 month follow up     "Doing well." Medications reviewed by the patient verbally.     HPI:  Mr. [name] is a 67 y.o.male patient with PMH of  Smoking, quit 2014 HTN hyperlipidemia CAD, CABG in 2014, LIMA to LAD, SVG to first intermediate, SVG to second intermediate, SVG to distal RCA.. Who presents follow-up of his coronary artery disease  Last seen by myself in clinic September 2023 Traveling to [place] April 24  If he misses medication, gets rare chest pain In general denies chest pain on exertion, no significant shortness of breath, no leg swelling Denies PND orthopnea  Blood pressure stable  Lab work reviewed A1c 5.9 Total cholesterol 103, LDL 40s Creatinine 1.1, normal BMP, LFTs  EKG personally reviewed by myself on todays visit Normal sinus rhythm rate 73 bpm consider old inferior MI, nonspecific T wave abnormality  Other past medical history reviewed  Cardiac catheterization 2014  99% stenosis of the left main coronary artery with 100% chronic occlusion of the right coronary artery and left to right collaterals filling of the distal right corner circulation. Left ventricular function was preserved. The patient was transferred directly to [facility] for urgent coronary artery bypass grafting.    Stress test August 2022: No significant ischemia, EF 57%  Echo 2019 NORMAL LEFT VENTRICULAR SYSTOLIC FUNCTION  NORMAL RIGHT VENTRICULAR SYSTOLIC FUNCTION  MILD VALVULAR REGURGITATION (See above)  NO VALVULAR STENOSIS  Closest EF: >55% (Estimated)  Tricuspid: MILD TR  Mitral: MILD MR   PMH:   has a past medical history of Allergy, Anginal pain ([place]), Anxiety, Chronic kidney disease, Colon polyp (2008), Coronary artery disease, Essential hypertension, benign (06/05/2012), GERD  (gastroesophageal reflux disease), History of herpes zoster, Hypercholesterolemia, Hyperlipidemia (06/05/2012), Hypertension, Left main coronary artery disease (06/05/2012), Myocardial infarction ([place]), Obesity (BMI 30-39.9) (06/05/2012), Obstructive sleep apnea (06/05/2012), Sleep apnea, Smoker, Tobacco abuse (06/05/2012), and Vitamin D deficiency.  PSH:    Past Surgical History:  Procedure Laterality Date   BASAL CELL CARCINOMA EXCISION  1997   CARDIAC CATHETERIZATION     COLONOSCOPY N/A 01/11/2020   Procedure: COLONOSCOPY;  Surgeon: Lesly Rubenstein, MD;  Location: [facility] ENDOSCOPY;  Service: Endoscopy;  Laterality: N/A;   COLONOSCOPY WITH PROPOFOL N/A 06/10/2015   Procedure: COLONOSCOPY WITH PROPOFOL;  Surgeon: Lollie Sails, MD;  Location[facility] ENDOSCOPY;  Service: Endoscopy;  Laterality: N/A;   CORONARY ARTERY BYPASS GRAFT N/A 06/06/2012   Procedure: CORONARY ARTERY BYPASS GRAFTING (CABG);  Surgeon: Grace Isaac, MD;  Location: [place];  Service: Open Heart Surgery;  Laterality: N/A;  x4 using right greater saphenous vein and left internal mammary.    ENDOVEIN HARVEST OF GREATER SAPHENOUS VEIN Right 06/06/2012   Procedure: ENDOVEIN HARVEST OF GREATER SAPHENOUS VEIN;  Surgeon: Grace Isaac, MD;  Location: [place];  Service: Open Heart Surgery;  Laterality: Right;   ESOPHAGOGASTRODUODENOSCOPY (EGD) WITH PROPOFOL N/A 06/10/2015   Procedure: ESOPHAGOGASTRODUODENOSCOPY (EGD) WITH PROPOFOL;  Surgeon: Lollie Sails, MD;  Location[facility] ENDOSCOPY;  Service: Endoscopy;  Laterality: N/A;   INTRAOPERATIVE TRANSESOPHAGEAL ECHOCARDIOGRAM N/A 06/06/2012   Procedure: INTRAOPERATIVE TRANSESOPHAGEAL ECHOCARDIOGRAM;  Surgeon: Grace Isaac, MD;  Location: [place];  Service: Open Heart Surgery;  Laterality: N/A;   LITHOTRIPSY  01/11/2008   TONSILLECTOMY      Current  Outpatient Medications  Medication Sig Dispense Refill   aspirin EC (ASPIRIN 81) 81 MG tablet Take 81 mg by mouth daily. Swallow whole.      azelastine (ASTELIN) 0.1 % nasal spray Place 2 sprays into both nostrils 2 (two) times daily. Use in each nostril as directed 30 mL 2   cetirizine (ZYRTEC) 10 MG tablet Take 10 mg by mouth every evening.     chlorpheniramine-HYDROcodone (TUSSIONEX) 10-8 MG/5ML Take 5 mLs by mouth at bedtime as needed for cough. 70 mL 0   Cholecalciferol (VITAMIN D3) 1.25 MG (50000 UT) CAPS Vitamin D3     ezetimibe (ZETIA) 10 MG tablet Take 1 tablet (10 mg total) by mouth daily. 90 tablet 3   finasteride (PROSCAR) 5 MG tablet TAKE 1 TABLET BY MOUTH DAILY 90 tablet 1   fluticasone (FLONASE) 50 MCG/ACT nasal spray fluticasone propionate 50 mcg/actuation nasal spray,suspension  SPRAY TWICE IN EACH NOSTRIL ONCE DAILY     losartan-hydrochlorothiazide (HYZAAR) 100-12.5 MG tablet Take 1 tablet by mouth daily. 90 tablet 3   metoprolol succinate (TOPROL-XL) 50 MG 24 hr tablet Take 1 tablet (50 mg total) by mouth daily. Take with or immediately following a meal. 90 tablet 3   pantoprazole (PROTONIX) 20 MG tablet Take 20 mg by mouth daily.     rosuvastatin (CRESTOR) 20 MG tablet Take 1 tablet (20 mg total) by mouth daily. 90 tablet 3   tamsulosin (FLOMAX) 0.4 MG CAPS capsule Take 1 capsule (0.4 mg total) by mouth daily. 90 capsule 1   No current facility-administered medications for this visit.    Allergies:   Sulfa antibiotics   Social History:  The patient  reports that he quit smoking about 9 years ago. His smoking use included cigarettes. He has a 55.50 pack-year smoking history. He has never used smokeless tobacco. He reports that he does not drink alcohol and does not use drugs.   Family History:   family history includes Hypertension in an other family member.    Review of Systems: Review of Systems  Constitutional: Negative.   HENT: Negative.    Respiratory: Negative.    Cardiovascular: Negative.   Gastrointestinal: Negative.   Musculoskeletal: Negative.   Neurological: Negative.    Psychiatric/Behavioral: Negative.    All other systems reviewed and are negative.   PHYSICAL EXAM: VS:  BP 130/60 (BP Location: Left Arm, Patient Position: Sitting, Cuff Size: Normal)   Pulse 73   Ht '5\' 6"'$  (1.676 m)   Wt 231 lb 4 oz (104.9 kg)   SpO2 96%   BMI 37.32 kg/m  , BMI Body mass index is 37.32 kg/m. Constitutional:  oriented to person, place, and time. No distress.  HENT:  Head: Grossly normal Eyes:  no discharge. No scleral icterus.  Neck: No JVD, no carotid bruits  Cardiovascular: Regular rate and rhythm, no murmurs appreciated Pulmonary/Chest: Clear to auscultation bilaterally, no wheezes or rails Abdominal: Soft.  no distension.  no tenderness.  Musculoskeletal: Normal range of motion Neurological:  normal muscle tone. Coordination normal. No atrophy Skin: Skin warm and dry Psychiatric: normal affect, pleasant  Recent Labs: 12/18/2021: ALT 22; BUN 22; Creatinine, Ser 1.14; Hemoglobin 17.7; Platelets 240; Potassium 3.7; Sodium 140    Lipid Panel Lab Results  Component Value Date   CHOL 103 12/18/2021   HDL 37 (L) 12/18/2021   LDLCALC 46 12/18/2021   TRIG 104 12/18/2021      Wt Readings from Last 3 Encounters:  03/15/22 231 lb 4 oz (104.9  kg)  01/12/22 225 lb (102.1 kg)  12/18/21 221 lb (100.2 kg)     ASSESSMENT AND PLAN:  Problem List Items Addressed This Visit       Cardiology Problems   Hyperlipidemia (Chronic)   Coronary artery disease - Primary   Essential (primary) hypertension     Other   Obstructive sleep apnea (Chronic)   Other Visit Diagnoses     Hx of CABG          Coronary artery disease with stable angina Left main disease on catheterization 2014, occluded RCA with collaterals left to right Underwent CABG in [facility] Currently with no symptoms of angina. No further workup at this time. Continue current medication regimen.  Recommend regular exercise program  Hyperlipidemia Continue Crestor 20 daily,Zetia 10 mg daily,  cholesterol at goal  Essential hypertension Blood pressure is well controlled on today's visit. No changes made to the medications.  Prediabetes A1c relatively well controlled 5.9 Recommend low carbohydrate diet, regular walking program    Total encounter time more than 30 minutes  Greater than 50% was spent in counseling and coordination of care with the patient    Signed, Esmond Plants, M.D., Ph.D. [place], [place]

## 2022-03-15 ENCOUNTER — Encounter: Payer: Self-pay | Admitting: Cardiovascular Disease

## 2022-03-15 ENCOUNTER — Ambulatory Visit: Payer: PPO | Attending: Cardiovascular Disease | Admitting: Cardiovascular Disease

## 2022-03-15 VITALS — BP 130/60 | HR 73 | Ht 66.0 in | Wt 231.2 lb

## 2022-03-15 DIAGNOSIS — I25708 Atherosclerosis of coronary artery bypass graft(s), unspecified, with other forms of angina pectoris: Secondary | ICD-10-CM

## 2022-03-15 DIAGNOSIS — Z951 Presence of aortocoronary bypass graft: Secondary | ICD-10-CM

## 2022-03-15 DIAGNOSIS — E782 Mixed hyperlipidemia: Secondary | ICD-10-CM

## 2022-03-15 DIAGNOSIS — G4733 Obstructive sleep apnea (adult) (pediatric): Secondary | ICD-10-CM

## 2022-03-15 DIAGNOSIS — I10 Essential (primary) hypertension: Secondary | ICD-10-CM | POA: Diagnosis not present

## 2022-03-15 MED ORDER — LOSARTAN POTASSIUM-HCTZ 100-12.5 MG PO TABS: 1.0000 | ORAL_TABLET | Freq: Every day | ORAL | 3 refills | Status: DC

## 2022-03-15 MED ORDER — EZETIMIBE 10 MG PO TABS: 10.0000 mg | ORAL_TABLET | Freq: Every day | ORAL | 3 refills | Status: DC

## 2022-03-15 MED ORDER — ROSUVASTATIN CALCIUM 20 MG PO TABS: 20.0000 mg | ORAL_TABLET | Freq: Every day | ORAL | 3 refills | Status: DC

## 2022-03-15 MED ORDER — METOPROLOL SUCCINATE ER 50 MG PO TB24: 50.0000 mg | ORAL_TABLET | Freq: Every day | ORAL | 3 refills | Status: DC

## 2022-03-15 NOTE — Patient Instructions (Signed)
Medication Instructions:  No changes  If you need a refill on your cardiac medications before your next appointment, please call your pharmacy.   Lab work: No new labs needed  Testing/Procedures: No new testing needed  Follow-Up: At [facility], you and your health needs are our priority.  As part of our continuing mission to provide you with exceptional heart care, we have created designated Provider Care Teams.  These Care Teams include your primary Cardiologist (physician) and Advanced Practice Providers (APPs -  Physician Assistants and Nurse Practitioners) who all work together to provide you with the care you need, when you need it.  You will need a follow up appointment in 12 months  Providers on your designated Care Team:   Christopher Berge, NP Ryan Dunn, PA-C Cadence Furth, PA-C  COVID-19 Vaccine Information can be found at: [email or web address] For questions related to vaccine distribution or appointments, please email [email or web address] or call [phone].   

## 2022-04-19 ENCOUNTER — Ambulatory Visit (INDEPENDENT_AMBULATORY_CARE_PROVIDER_SITE_OTHER): Payer: PPO

## 2022-04-19 VITALS — Ht 67.0 in | Wt 231.0 lb

## 2022-04-19 DIAGNOSIS — Z0000 Encounter for general adult medical examination without abnormal findings: Secondary | ICD-10-CM | POA: Diagnosis not present

## 2022-04-19 NOTE — Patient Instructions (Signed)
Mr. Slaski , Thank you for taking time to come for your Medicare Wellness Visit. I appreciate your ongoing commitment to your health goals. Please review the following plan we discussed and let me know if I can assist you in the future.   These are the goals we discussed:  Goals      DIET - EAT MORE FRUITS AND VEGETABLES        This is a list of the screening recommended for you and due dates:  Health Maintenance  Topic Date Due   COVID-19 Vaccine (1) Never done   Zoster (Shingles) Vaccine (1 of 2) Never done   DTaP/Tdap/Td vaccine (2 - Td or Tdap) 12/20/2021   Pneumonia Vaccine (1 of 1 - PCV) 06/17/2022*   Screening for Lung Cancer  12/19/2022*   Flu Shot  08/05/2022   Medicare Annual Wellness Visit  04/19/2023   Colon Cancer Screening  01/10/2030   Hepatitis C Screening: USPSTF Recommendation to screen - Ages 18-79 yo.  Completed   HPV Vaccine  Aged Out  *Topic was postponed. The date shown is not the original due date.    Advanced directives: yes  Conditions/risks identified: none  Next appointment: Follow up in one year for your annual wellness visit. 04/20/2023 @8 :15am telephone  Preventive Care 68 Years and Older, Male  Preventive care refers to lifestyle choices and visits with your health care provider that can promote health and wellness. What does preventive care include? A yearly physical exam. This is also called an annual well check. Dental exams once or twice a year. Routine eye exams. Ask your health care provider how often you should have your eyes checked. Personal lifestyle choices, including: Daily care of your teeth and gums. Regular physical activity. Eating a healthy diet. Avoiding tobacco and drug use. Limiting alcohol use. Practicing safe sex. Taking low doses of aspirin every day. Taking vitamin and mineral supplements as recommended by your health care provider. What happens during an annual well check? The services and screenings done by  your health care provider during your annual well check will depend on your age, overall health, lifestyle risk factors, and family history of disease. Counseling  Your health care provider may ask you questions about your: Alcohol use. Tobacco use. Drug use. Emotional well-being. Home and relationship well-being. Sexual activity. Eating habits. History of falls. Memory and ability to understand (cognition). Work and work Astronomer. Screening  You may have the following tests or measurements: Height, weight, and BMI. Blood pressure. Lipid and cholesterol levels. These may be checked every 5 years, or more frequently if you are over 70 years old. Skin check. Lung cancer screening. You may have this screening every year starting at age 66 if you have a 30-pack-year history of smoking and currently smoke or have quit within the past 15 years. Fecal occult blood test (FOBT) of the stool. You may have this test every year starting at age 22. Flexible sigmoidoscopy or colonoscopy. You may have a sigmoidoscopy every 5 years or a colonoscopy every 10 years starting at age 36. Prostate cancer screening. Recommendations will vary depending on your family history and other risks. Hepatitis C blood test. Hepatitis B blood test. Sexually transmitted disease (STD) testing. Diabetes screening. This is done by checking your blood sugar (glucose) after you have not eaten for a while (fasting). You may have this done every 1-3 years. Abdominal aortic aneurysm (AAA) screening. You may need this if you are a current or former smoker. Osteoporosis.  You may be screened starting at age 47 if you are at high risk. Talk with your health care provider about your test results, treatment options, and if necessary, the need for more tests. Vaccines  Your health care provider may recommend certain vaccines, such as: Influenza vaccine. This is recommended every year. Tetanus, diphtheria, and acellular pertussis  (Tdap, Td) vaccine. You may need a Td booster every 10 years. Zoster vaccine. You may need this after age 25. Pneumococcal 13-valent conjugate (PCV13) vaccine. One dose is recommended after age 41. Pneumococcal polysaccharide (PPSV23) vaccine. One dose is recommended after age 28. Talk to your health care provider about which screenings and vaccines you need and how often you need them. This information is not intended to replace advice given to you by your health care provider. Make sure you discuss any questions you have with your health care provider. Document Released: 01/17/2015 Document Revised: 09/10/2015 Document Reviewed: 10/22/2014 Elsevier Interactive Patient Education  2017 [employer] Prevention in the Home Falls can cause injuries. They can happen to people of all ages. There are many things you can do to make your home safe and to help prevent falls. What can I do on the outside of my home? Regularly fix the edges of walkways and driveways and fix any cracks. Remove anything that might make you trip as you walk through a door, such as a raised step or threshold. Trim any bushes or trees on the path to your home. Use bright outdoor lighting. Clear any walking paths of anything that might make someone trip, such as rocks or tools. Regularly check to see if handrails are loose or broken. Make sure that both sides of any steps have handrails. Any raised decks and porches should have guardrails on the edges. Have any leaves, snow, or ice cleared regularly. Use sand or salt on walking paths during winter. Clean up any spills in your garage right away. This includes oil or grease spills. What can I do in the bathroom? Use night lights. Install grab bars by the toilet and in the tub and shower. Do not use towel bars as grab bars. Use non-skid mats or decals in the tub or shower. If you need to sit down in the shower, use a plastic, non-slip stool. Keep the floor dry. Clean  up any water that spills on the floor as soon as it happens. Remove soap buildup in the tub or shower regularly. Attach bath mats securely with double-sided non-slip rug tape. Do not have throw rugs and other things on the floor that can make you trip. What can I do in the bedroom? Use night lights. Make sure that you have a light by your bed that is easy to reach. Do not use any sheets or blankets that are too big for your bed. They should not hang down onto the floor. Have a firm chair that has side arms. You can use this for support while you get dressed. Do not have throw rugs and other things on the floor that can make you trip. What can I do in the kitchen? Clean up any spills right away. Avoid walking on wet floors. Keep items that you use a lot in easy-to-reach places. If you need to reach something above you, use a strong step stool that has a grab bar. Keep electrical cords out of the way. Do not use floor polish or wax that makes floors slippery. If you must use wax, use non-skid floor wax.  Do not have throw rugs and other things on the floor that can make you trip. What can I do with my stairs? Do not leave any items on the stairs. Make sure that there are handrails on both sides of the stairs and use them. Fix handrails that are broken or loose. Make sure that handrails are as long as the stairways. Check any carpeting to make sure that it is firmly attached to the stairs. Fix any carpet that is loose or worn. Avoid having throw rugs at the top or bottom of the stairs. If you do have throw rugs, attach them to the floor with carpet tape. Make sure that you have a light switch at the top of the stairs and the bottom of the stairs. If you do not have them, ask someone to add them for you. What else can I do to help prevent falls? Wear shoes that: Do not have high heels. Have rubber bottoms. Are comfortable and fit you well. Are closed at the toe. Do not wear sandals. If you  use a stepladder: Make sure that it is fully opened. Do not climb a closed stepladder. Make sure that both sides of the stepladder are locked into place. Ask someone to hold it for you, if possible. Clearly mark and make sure that you can see: Any grab bars or handrails. First and last steps. Where the edge of each step is. Use tools that help you move around (mobility aids) if they are needed. These include: Canes. Walkers. Scooters. Crutches. Turn on the lights when you go into a dark area. Replace any light bulbs as soon as they burn out. Set up your furniture so you have a clear path. Avoid moving your furniture around. If any of your floors are uneven, fix them. If there are any pets around you, be aware of where they are. Review your medicines with your doctor. Some medicines can make you feel dizzy. This can increase your chance of falling. Ask your doctor what other things that you can do to help prevent falls. This information is not intended to replace advice given to you by your health care provider. Make sure you discuss any questions you have with your health care provider. Document Released: 10/17/2008 Document Revised: 05/29/2015 Document Reviewed: 01/25/2014 Elsevier Interactive Patient Education  2017 [employer].

## 2022-04-19 NOTE — Progress Notes (Addendum)
I connected with  [name] on 04/19/22 by a audio enabled telemedicine application and verified that I am speaking with the correct person using two identifiers.  Patient Location: Home  Provider Location: Office/Clinic  I discussed the limitations of evaluation and management by telemedicine. The patient expressed understanding and agreed to proceed.  Subjective:   [name] is a 67 y.o. male who presents for Medicare Annual/Subsequent preventive examination.  Review of Systems         Objective:    Today's Vitals   04/19/22 0822  Weight: 231 lb (104.8 kg)  Height: 5\' 7"  (1.702 m)   Body mass index is 36.18 kg/m.     04/19/2022    8:29 AM 04/14/2021    9:07 AM 02/09/2021   11:43 AM 01/11/2020   11:02 AM 08/08/2014    9:56 AM 07/19/2014    2:08 AM 07/18/2014    7:10 PM  Advanced Directives  Does Patient Have a Medical Advance Directive? Yes No No Yes Yes Yes No  Type of Aeronautical engineer of [place];Living will Living will Living will   Does patient want to make changes to medical advance directive?      No - Patient declined   Copy of Healthcare Power of Attorney in Chart?      No - copy requested   Would patient like information on creating a medical advance directive?  No - Patient declined    No - patient declined information No - patient declined information    Current Medications (verified) Outpatient Encounter Medications as of 04/19/2022  Medication Sig   aspirin EC (ASPIRIN 81) 81 MG tablet Take 81 mg by mouth daily. Swallow whole.   azelastine (ASTELIN) 0.1 % nasal spray Place 2 sprays into both nostrils 2 (two) times daily. Use in each nostril as directed   cetirizine (ZYRTEC) 10 MG tablet Take 10 mg by mouth every evening.   chlorpheniramine-HYDROcodone (TUSSIONEX) 10-8 MG/5ML Take 5 mLs by mouth at bedtime as needed for cough. (Patient not taking: Reported on 04/19/2022)   Cholecalciferol (VITAMIN D3) 1.25 MG (50000 UT) CAPS Vitamin D3    ezetimibe (ZETIA) 10 MG tablet Take 1 tablet (10 mg total) by mouth daily.   finasteride (PROSCAR) 5 MG tablet TAKE 1 TABLET BY MOUTH DAILY   fluticasone (FLONASE) 50 MCG/ACT nasal spray fluticasone propionate 50 mcg/actuation nasal spray,suspension  SPRAY TWICE IN EACH NOSTRIL ONCE DAILY   losartan-hydrochlorothiazide (HYZAAR) 100-12.5 MG tablet Take 1 tablet by mouth daily.   metoprolol succinate (TOPROL-XL) 50 MG 24 hr tablet Take 1 tablet (50 mg total) by mouth daily. Take with or immediately following a meal.   pantoprazole (PROTONIX) 20 MG tablet Take 20 mg by mouth daily.   rosuvastatin (CRESTOR) 20 MG tablet Take 1 tablet (20 mg total) by mouth daily.   tamsulosin (FLOMAX) 0.4 MG CAPS capsule Take 1 capsule (0.4 mg total) by mouth daily.   No facility-administered encounter medications on file as of 04/19/2022.    Allergies (verified) Sulfa antibiotics   History: Past Medical History:  Diagnosis Date   Allergy    Anginal pain    Anxiety    Chronic kidney disease    kidney stones   Colon polyp 2008   Colonoscopy due to repeat in 2011 due to polyps   Coronary artery disease    Essential hypertension, benign 06/05/2012   GERD (gastroesophageal reflux disease)    History of herpes zoster  Hypercholesterolemia    Hyperlipidemia 06/05/2012   Hypertension    Left main coronary artery disease 06/05/2012   Myocardial infarction    Obesity (BMI 30-39.9) 06/05/2012   Obstructive sleep apnea 06/05/2012   Sleep apnea    Smoker    Tobacco abuse 06/05/2012   Vitamin D deficiency    Past Surgical History:  Procedure Laterality Date   BASAL CELL CARCINOMA EXCISION  1997   CARDIAC CATHETERIZATION     COLONOSCOPY N/A 01/11/2020   Procedure: COLONOSCOPY;  Surgeon: Regis Bill, MD;  Location: [facility] ENDOSCOPY;  Service: Endoscopy;  Laterality: N/A;   COLONOSCOPY WITH PROPOFOL N/A 06/10/2015   Procedure: COLONOSCOPY WITH PROPOFOL;  Surgeon: Christena Deem, MD;   Location: [facility] ENDOSCOPY;  Service: Endoscopy;  Laterality: N/A;   CORONARY ARTERY BYPASS GRAFT N/A 06/06/2012   Procedure: CORONARY ARTERY BYPASS GRAFTING (CABG);  Surgeon: Delight Ovens, MD;  Location: [facility] OR;  Service: Open Heart Surgery;  Laterality: N/A;  x4 using right greater saphenous vein and left internal mammary.    CORONARY ARTERY BYPASS GRAFT  06/06/2012   ENDOVEIN HARVEST OF GREATER SAPHENOUS VEIN Right 06/06/2012   Procedure: ENDOVEIN HARVEST OF GREATER SAPHENOUS VEIN;  Surgeon: Delight Ovens, MD;  Location: [facility] OR;  Service: Open Heart Surgery;  Laterality: Right;   ESOPHAGOGASTRODUODENOSCOPY (EGD) WITH PROPOFOL N/A 06/10/2015   Procedure: ESOPHAGOGASTRODUODENOSCOPY (EGD) WITH PROPOFOL;  Surgeon: Christena Deem, MD;  Location: [facility] ENDOSCOPY;  Service: Endoscopy;  Laterality: N/A;   INTRAOPERATIVE TRANSESOPHAGEAL ECHOCARDIOGRAM N/A 06/06/2012   Procedure: INTRAOPERATIVE TRANSESOPHAGEAL ECHOCARDIOGRAM;  Surgeon: Delight Ovens, MD;  Location: [facility] OR;  Service: Open Heart Surgery;  Laterality: N/A;   LITHOTRIPSY  01/11/2008   TONSILLECTOMY     Family History  Problem Relation Age of Onset   Hypertension Other    Social History   Socioeconomic History   Marital status: Married    Spouse name: Not on file   Number of children: Not on file   Years of education: Not on file   Highest education level: Not on file  Occupational History   Occupation: retired  Tobacco Use   Smoking status: Former    Packs/day: 1.50    Years: 37.00    Additional pack years: 0.00    Total pack years: 55.50    Types: Cigarettes    Quit date: 06/05/2012    Years since quitting: 9.8   Smokeless tobacco: Never  Vaping Use   Vaping Use: Never used  Substance and Sexual Activity   Alcohol use: No   Drug use: No   Sexual activity: Yes    Birth control/protection: None  Other Topics Concern   Not on file  Social History Narrative   Not on file   Social Determinants of Health    Financial Resource Strain: Low Risk  (04/15/2022)   Overall Financial Resource Strain (CARDIA)    Difficulty of Paying Living Expenses: Not hard at all  Food Insecurity: No Food Insecurity (04/15/2022)   Hunger Vital Sign    Worried About Running Out of Food in the Last Year: Never true    Ran Out of Food in the Last Year: Never true  Transportation Needs: No Transportation Needs (04/15/2022)   PRAPARE - Administrator, Civil Service (Medical): No    Lack of Transportation (Non-Medical): No  Physical Activity: Insufficiently Active (04/15/2022)   Exercise Vital Sign    Days of Exercise per Week: 1 day    Minutes of  Exercise per Session: 60 min  Stress: No Stress Concern Present (04/15/2022)   [employer] of Occupational Health - Occupational Stress Questionnaire    Feeling of Stress : Not at all  Social Connections: Unknown (04/15/2022)   Social Connection and Isolation Panel [NHANES]    Frequency of Communication with Friends and Family: More than three times a week    Frequency of Social Gatherings with Friends and Family: Once a week    Attends Religious Services: Not on Marketing executive or Organizations: Yes    Attends Engineer, structural: More than 4 times per year    Marital Status: Married    Tobacco Counseling Counseling given: Not Answered   Clinical Intake:  Pre-visit preparation completed: Yes  Pain : No/denies pain     BMI - recorded: 36.18 Nutritional Status: BMI > 30  Obese Nutritional Risks: None Diabetes: No  How often do you need to have someone help you when you read instructions, pamphlets, or other written materials from your doctor or pharmacy?: 1 - Never  Diabetic?no  Interpreter Needed?: No  Comments: lives w/spouse Information entered by :: B.Ceniyah Thorp,LPN   Activities of Daily Living    04/15/2022    8:46 PM 12/18/2021    8:54 AM  In your present state of health, do you have any difficulty  performing the following activities:  Hearing? 0 0  Vision? 0 0  Difficulty concentrating or making decisions? 0 0  Walking or climbing stairs? 0 0  Dressing or bathing? 0 0  Doing errands, shopping? 0 0  Preparing Food and eating ? N   Using the Toilet? N   In the past six months, have you accidently leaked urine? Y   Do you have problems with loss of bowel control? N   Managing your Medications? N   Managing your Finances? N   Housekeeping or managing your Housekeeping? N     Patient Care Team: Alfredia Ferguson, PA-C as PCP - General (Physician Assistant) Lady Gary Darlin Priestly, MD as Attending Physician (Cardiology)  Indicate any recent Medical Services you may have received from other than [facility] providers in the past year (date may be approximate).     Assessment:   This is a routine wellness examination for Apolinar.  Hearing/Vision screen Hearing Screening - Comments:: Adequate hearing Vision Screening - Comments:: Adequate vision w/glasses Dr Alvester Morin  Dietary issues and exercise activities discussed:     Goals Addressed             This Visit's Progress    DIET - EAT MORE FRUITS AND VEGETABLES   On track      Depression Screen    12/18/2021    8:54 AM 04/14/2021    9:05 AM 01/16/2021   10:00 AM 05/17/2019    9:07 AM 02/21/2018    9:07 AM  PHQ 2/9 Scores  PHQ - 2 Score 0 0 0 0 0  PHQ- 9 Score 0  0 0 0    Fall Risk    04/15/2022    8:46 PM 12/18/2021    8:54 AM 04/14/2021    9:08 AM 01/16/2021   10:00 AM 05/17/2019    9:07 AM  Fall Risk   Falls in the past year? 0 0 0 0 0  Number falls in past yr: 0 0 0 0   Injury with Fall? 0 0 0 0   Risk for fall due to :  No Fall Risks No  Fall Risks No Fall Risks   Follow up  Falls evaluation completed Falls evaluation completed      FALL RISK PREVENTION PERTAINING TO THE HOME:  Any stairs in or around the home? No  If so, are there any without handrails? No  Home free of loose throw rugs in walkways, pet beds, electrical  cords, etc? Yes  Adequate lighting in your home to reduce risk of falls? Yes   ASSISTIVE DEVICES UTILIZED TO PREVENT FALLS:  Life alert? No  Use of a cane, walker or w/c? No  Grab bars in the bathroom? No  Shower chair or bench in shower? No  Elevated toilet seat or a handicapped toilet? No   Cognitive Function:        04/19/2022    8:30 AM  6CIT Screen  What Year? 0 points  What month? 0 points  What time? 0 points  Count back from 20 0 points  Months in reverse 0 points  Repeat phrase 0 points  Total Score 0 points   Immunizations Immunization History  Administered Date(s) Administered   Tdap 12/21/2011    TDAP status: Up to date  Flu Vaccine status: Declined, Education has been provided regarding the importance of this vaccine but patient still declined. Advised may receive this vaccine at local pharmacy or Health Dept. Aware to provide a copy of the vaccination record if obtained from local pharmacy or Health Dept. Verbalized acceptance and understanding.  Pneumococcal vaccine status: Declined,  Education has been provided regarding the importance of this vaccine but patient still declined. Advised may receive this vaccine at local pharmacy or Health Dept. Aware to provide a copy of the vaccination record if obtained from local pharmacy or Health Dept. Verbalized acceptance and understanding.   Covid-19 vaccine status: Declined, Education has been provided regarding the importance of this vaccine but patient still declined. Advised may receive this vaccine at local pharmacy or Health Dept.or vaccine clinic. Aware to provide a copy of the vaccination record if obtained from local pharmacy or Health Dept. Verbalized acceptance and understanding.  Qualifies for Shingles Vaccine? Yes   Zostavax completed No   Shingrix Completed?: No.    Education has been provided regarding the importance of this vaccine. Patient has been advised to call insurance company to determine out of  pocket expense if they have not yet received this vaccine. Advised may also receive vaccine at local pharmacy or Health Dept. Verbalized acceptance and understanding.  Screening Tests Health Maintenance  Topic Date Due   COVID-19 Vaccine (1) Never done   Zoster Vaccines- Shingrix (1 of 2) Never done   DTaP/Tdap/Td (2 - Td or Tdap) 12/20/2021   Pneumonia Vaccine 37+ Years old (1 of 1 - PCV) 06/17/2022 (Originally 11/17/2019)   Lung Cancer Screening  12/19/2022 (Originally 11/16/2004)   INFLUENZA VACCINE  08/05/2022   Medicare Annual Wellness (AWV)  04/19/2023   COLONOSCOPY (Pts 45-47yrs Insurance coverage will need to be confirmed)  01/10/2030   Hepatitis C Screening  Completed   HPV VACCINES  Aged Out    Health Maintenance  Health Maintenance Due  Topic Date Due   COVID-19 Vaccine (1) Never done   Zoster Vaccines- Shingrix (1 of 2) Never done   DTaP/Tdap/Td (2 - Td or Tdap) 12/20/2021    Colorectal cancer screening: Type of screening: Colonoscopy. Completed yes. Repeat every 10 years  Lung Cancer Screening: (Low Dose CT Chest recommended if Age 72-80 years, 30 pack-year currently smoking OR have quit w/in 15years.)  does qualify.   Lung Cancer Screening Referral: no-pt declines  Additional Screening:  Hepatitis C Screening: does qualify; Completed yes  Vision Screening: Recommended annual ophthalmology exams for early detection of glaucoma and other disorders of the eye. Is the patient up to date with their annual eye exam?  Yes  Who is the provider or what is the name of the office in which the patient attends annual eye exams? Dr Alvester Morin If pt is not established with a provider, would they like to be referred to a provider to establish care? No .   Dental Screening: Recommended annual dental exams for proper oral hygiene  Community Resource Referral / Chronic Care Management: CRR required this visit?  No   CCM required this visit?  No     Plan:     I have personally  reviewed and noted the following in the patient's chart:   Medical and social history Use of alcohol, tobacco or illicit drugs  Current medications and supplements including opioid prescriptions. Patient is not currently taking opioid prescriptions. Functional ability and status Nutritional status Physical activity Advanced directives List of other physicians Hospitalizations, surgeries, and ER visits in previous 12 months Vitals Screenings to include cognitive, depression, and falls Referrals and appointments  In addition, I have reviewed and discussed with patient certain preventive protocols, quality metrics, and best practice recommendations. A written personalized care plan for preventive services as well as general preventive health recommendations were provided to patient.    Sue Lush, LPN   02/17/863   Nurse Notes: The patient states he is doing well and has no concerns or questions at this time.

## 2022-06-08 ENCOUNTER — Other Ambulatory Visit: Payer: Self-pay | Admitting: Physician Assistant

## 2022-06-08 DIAGNOSIS — N401 Enlarged prostate with lower urinary tract symptoms: Secondary | ICD-10-CM

## 2022-10-13 ENCOUNTER — Other Ambulatory Visit: Payer: Self-pay | Admitting: Physician Assistant

## 2022-10-13 DIAGNOSIS — N401 Enlarged prostate with lower urinary tract symptoms: Secondary | ICD-10-CM

## 2022-10-19 ENCOUNTER — Encounter: Payer: Self-pay | Admitting: Physician Assistant

## 2022-10-19 ENCOUNTER — Ambulatory Visit: Payer: PPO | Admitting: Physician Assistant

## 2022-10-19 VITALS — BP 129/82 | HR 52 | Ht 66.0 in | Wt 229.0 lb

## 2022-10-19 DIAGNOSIS — E782 Mixed hyperlipidemia: Secondary | ICD-10-CM

## 2022-10-19 DIAGNOSIS — N401 Enlarged prostate with lower urinary tract symptoms: Secondary | ICD-10-CM | POA: Diagnosis not present

## 2022-10-19 DIAGNOSIS — I10 Essential (primary) hypertension: Secondary | ICD-10-CM | POA: Diagnosis not present

## 2022-10-19 DIAGNOSIS — K2100 Gastro-esophageal reflux disease with esophagitis, without bleeding: Secondary | ICD-10-CM | POA: Diagnosis not present

## 2022-10-19 DIAGNOSIS — R351 Nocturia: Secondary | ICD-10-CM

## 2022-10-19 MED ORDER — FINASTERIDE 5 MG PO TABS: 5.0000 mg | ORAL_TABLET | Freq: Every day | ORAL | 1 refills | Status: DC

## 2022-10-19 NOTE — Progress Notes (Signed)
Established patient visit  Patient: [name]   DOB: 1955/02/01   67 y.o. Male  MRN: [id number] Visit Date: 10/19/2022  Today's healthcare provider: Debera Lat, PA-C   Chief Complaint  Patient presents with   Medical Management of Chronic Issues    Last seen 12/18/21 for CPE.    Subjective     Discussed the use of AI scribe software for clinical note transcription with the patient, who gave verbal consent to proceed.  History of Present Illness   He reports that his urinary frequency and urgency are well controlled on his current regimen of tamsulosin and finasteride. He has seen a urologist who offered a procedure to further improve his symptoms, but he declined, stating that his symptoms were not severe enough to warrant a procedure.  The patient also has a history of hypertension and hyperlipidemia, which are managed with Zetia 10 mcg, Hazar, and Crestor 20 mcg. He reports adherence to his medication regimen. He also has a history of acid reflux, for which he takes Protonix. The dose was recently decreased from 40 to 20 by his gastroenterologist.           12/18/2021    8:54 AM 04/14/2021    9:05 AM 01/16/2021   10:00 AM  Depression screen PHQ 2/9  Decreased Interest 0 0 0  Down, Depressed, Hopeless 0 0 0  PHQ - 2 Score 0 0 0  Altered sleeping 0  0  Tired, decreased energy 0  0  Change in appetite 0  0  Feeling bad or failure about yourself  0  0  Trouble concentrating 0  0  Moving slowly or fidgety/restless 0  0  Suicidal thoughts 0  0  PHQ-9 Score 0  0  Difficult doing work/chores Not difficult at all  Not difficult at all       No data to display          Medications: Outpatient Medications Prior to Visit  Medication Sig   aspirin EC (ASPIRIN 81) 81 MG tablet Take 81 mg by mouth daily. Swallow whole.   azelastine (ASTELIN) 0.1 % nasal spray Place 2 sprays into both nostrils 2 (two) times daily. Use in each nostril as directed   cetirizine (ZYRTEC)  10 MG tablet Take 10 mg by mouth every evening.   chlorpheniramine-HYDROcodone (TUSSIONEX) 10-8 MG/5ML Take 5 mLs by mouth at bedtime as needed for cough. (Patient not taking: Reported on 04/19/2022)   Cholecalciferol (VITAMIN D3) 1.25 MG (50000 UT) CAPS Vitamin D3   ezetimibe (ZETIA) 10 MG tablet Take 1 tablet (10 mg total) by mouth daily.   fluticasone (FLONASE) 50 MCG/ACT nasal spray fluticasone propionate 50 mcg/actuation nasal spray,suspension  SPRAY TWICE IN EACH NOSTRIL ONCE DAILY   losartan-hydrochlorothiazide (HYZAAR) 100-12.5 MG tablet Take 1 tablet by mouth daily.   metoprolol succinate (TOPROL-XL) 50 MG 24 hr tablet Take 1 tablet (50 mg total) by mouth daily. Take with or immediately following a meal.   pantoprazole (PROTONIX) 20 MG tablet Take 20 mg by mouth daily.   rosuvastatin (CRESTOR) 20 MG tablet Take 1 tablet (20 mg total) by mouth daily.   tamsulosin (FLOMAX) 0.4 MG CAPS capsule TAKE 1 CAPSULE BY MOUTH ONCE DAILY   [DISCONTINUED] finasteride (PROSCAR) 5 MG tablet TAKE 1 TABLET BY MOUTH DAILY   No facility-administered medications prior to visit.    Review of Systems  All other systems reviewed and are negative.  Except see HPI       Objective  BP 129/82   Pulse (!) 52   Ht 5\' 6"  (1.676 m)   Wt 229 lb (103.9 kg)   BMI 36.96 kg/m     Physical Exam Vitals reviewed.  Constitutional:      General: He is not in acute distress.    Appearance: Normal appearance. He is not diaphoretic.  HENT:     Head: Normocephalic and atraumatic.  Eyes:     General: No scleral icterus.    Conjunctiva/sclera: Conjunctivae normal.  Cardiovascular:     Rate and Rhythm: Normal rate and regular rhythm.     Pulses: Normal pulses.     Heart sounds: Normal heart sounds. No murmur heard. Pulmonary:     Effort: Pulmonary effort is normal. No respiratory distress.     Breath sounds: Normal breath sounds. No wheezing or rhonchi.  Musculoskeletal:     Cervical back: Neck supple.      Right lower leg: No edema.     Left lower leg: No edema.  Lymphadenopathy:     Cervical: No cervical adenopathy.  Skin:    General: Skin is warm and dry.     Findings: No rash.  Neurological:     Mental Status: He is alert and oriented to person, place, and time. Mental status is at baseline.  Psychiatric:        Mood and Affect: Mood normal.        Behavior: Behavior normal.      No results found for any visits on 10/19/22.  Assessment & Plan        Benign Prostatic Hyperplasia Chronic Well controlled on Tamsulosin and Finasteride. Patient declined urologist's offer for procedural intervention. -Renew Finasteride prescription and send to [facility].  Hypertension and Hyperlipidemia Chronic Well controlled on Losartan, Metoprolol, Zetia, and Crestor. -Continue current regimen.  Gastroesophageal Reflux Disease Chronic Dose of Protonix decreased from 40mg  to 20mg  by gastroenterologist. -Continue Protonix 20mg  daily.  Follow-up Annual physical scheduled for December 2024.     Return in about 2 months (around 12/19/2022) for CPE.     The patient was advised to call back or seek an in-person evaluation if the symptoms worsen or if the condition fails to improve as anticipated.  I discussed the assessment and treatment plan with the patient. The patient was provided an opportunity to ask questions and all were answered. The patient agreed with the plan and demonstrated an understanding of the instructions.  I, Debera Lat, PA-C have reviewed all documentation for this visit. The documentation on  10/19/22 for the exam, diagnosis, procedures, and orders are all accurate and complete.  Debera Lat, [facility], MMS [facility] [phone] (phone) [phone] (fax)  [facility]

## 2022-12-20 ENCOUNTER — Encounter: Payer: Self-pay | Admitting: Physician Assistant

## 2022-12-20 ENCOUNTER — Ambulatory Visit (INDEPENDENT_AMBULATORY_CARE_PROVIDER_SITE_OTHER): Payer: PPO | Admitting: Physician Assistant

## 2022-12-20 VITALS — BP 118/76 | HR 69 | Ht 66.0 in | Wt 230.0 lb

## 2022-12-20 DIAGNOSIS — E669 Obesity, unspecified: Secondary | ICD-10-CM | POA: Diagnosis not present

## 2022-12-20 DIAGNOSIS — Z125 Encounter for screening for malignant neoplasm of prostate: Secondary | ICD-10-CM

## 2022-12-20 DIAGNOSIS — Z23 Encounter for immunization: Secondary | ICD-10-CM

## 2022-12-20 DIAGNOSIS — J3089 Other allergic rhinitis: Secondary | ICD-10-CM | POA: Diagnosis not present

## 2022-12-20 DIAGNOSIS — Z0000 Encounter for general adult medical examination without abnormal findings: Secondary | ICD-10-CM

## 2022-12-20 DIAGNOSIS — Z0001 Encounter for general adult medical examination with abnormal findings: Secondary | ICD-10-CM

## 2022-12-20 DIAGNOSIS — F419 Anxiety disorder, unspecified: Secondary | ICD-10-CM

## 2022-12-20 MED ORDER — TETANUS-DIPHTH-ACELL PERTUSSIS 5-2.5-18.5 LF-MCG/0.5 IM SUSY: 0.5000 mL | PREFILLED_SYRINGE | Freq: Once | INTRAMUSCULAR | 0 refills | Status: AC

## 2022-12-20 NOTE — Progress Notes (Signed)
Complete physical exam  Patient: [name]   DOB: 1955/08/25   67 y.o. Male  MRN: [id number] Visit Date: 12/20/2022  Today's healthcare provider: Debera Lat, PA-C   No chief complaint on file.  Subjective    [name] is a 67 y.o. male who presents today for a complete physical exam.   Discussed the use of AI scribe software for clinical note transcription with the patient, who gave verbal consent to proceed.  History of Present Illness   The patient, a retired male with a history of hypertension, hyperlipidemia, and a quadruple bypass surgery in 2014, presents with post-nasal drainage, runny eyes, and dryness in the outer part of the ear canal. The patient reports these symptoms are not seasonal and occur all the time. The patient is currently taking Zyrtec daily for these symptoms. The patient also mentions a previous mild case of shingles that did not blister up, which was managed with medication. The patient quit smoking ten years ago and has not picked up a cigarette since his quadruple bypass surgery. The patient works part-time and is generally active. The patient's diet is regular, and he gets between seven and eight hours of sleep a night.        Last depression screening scores    12/20/2022    9:49 AM 12/18/2021    8:54 AM 04/14/2021    9:05 AM  PHQ 2/9 Scores  PHQ - 2 Score 0 0 0  PHQ- 9 Score 0 0    Last fall risk screening    12/20/2022    9:49 AM  Fall Risk   Falls in the past year? 0  Number falls in past yr: 0  Injury with Fall? 0   Last Audit-C alcohol use screening    10/14/2022   11:18 AM  Alcohol Use Disorder Test (AUDIT)  1. How often do you have a drink containing alcohol? 0  3. How often do you have six or more drinks on one occasion? 0   A score of 3 or more in women, and 4 or more in men indicates increased risk for alcohol abuse, EXCEPT if all of the points are from question 1   Past Medical History:  Diagnosis Date    Allergy    Anginal pain (HCC)    Anxiety    Chronic kidney disease    kidney stones   Colon polyp 2008   Colonoscopy due to repeat in 2011 due to polyps   Coronary artery disease    Essential hypertension, benign 06/05/2012   GERD (gastroesophageal reflux disease)    History of herpes zoster    Hypercholesterolemia    Hyperlipidemia 06/05/2012   Hypertension    Left main coronary artery disease 06/05/2012   Myocardial infarction (HCC)    Obesity (BMI 30-39.9) 06/05/2012   Obstructive sleep apnea 06/05/2012   Sleep apnea    Smoker    Tobacco abuse 06/05/2012   Vitamin D deficiency    Past Surgical History:  Procedure Laterality Date   BASAL CELL CARCINOMA EXCISION  1997   CARDIAC CATHETERIZATION     COLONOSCOPY N/A 01/11/2020   Procedure: COLONOSCOPY;  Surgeon: Regis Bill, MD;  Location: [facility] ENDOSCOPY;  Service: Endoscopy;  Laterality: N/A;   COLONOSCOPY WITH PROPOFOL N/A 06/10/2015   Procedure: COLONOSCOPY WITH PROPOFOL;  Surgeon: Christena Deem, MD;  Location: [facility] ENDOSCOPY;  Service: Endoscopy;  Laterality: N/A;   CORONARY ARTERY BYPASS GRAFT N/A 06/06/2012   Procedure: CORONARY  ARTERY BYPASS GRAFTING (CABG);  Surgeon: Delight Ovens, MD;  Location: [facility] OR;  Service: Open Heart Surgery;  Laterality: N/A;  x4 using right greater saphenous vein and left internal mammary.    CORONARY ARTERY BYPASS GRAFT  06/06/2012   ENDOVEIN HARVEST OF GREATER SAPHENOUS VEIN Right 06/06/2012   Procedure: ENDOVEIN HARVEST OF GREATER SAPHENOUS VEIN;  Surgeon: Delight Ovens, MD;  Location: [facility] OR;  Service: Open Heart Surgery;  Laterality: Right;   ESOPHAGOGASTRODUODENOSCOPY (EGD) WITH PROPOFOL N/A 06/10/2015   Procedure: ESOPHAGOGASTRODUODENOSCOPY (EGD) WITH PROPOFOL;  Surgeon: Christena Deem, MD;  Location: [facility] ENDOSCOPY;  Service: Endoscopy;  Laterality: N/A;   INTRAOPERATIVE TRANSESOPHAGEAL ECHOCARDIOGRAM N/A 06/06/2012   Procedure: INTRAOPERATIVE TRANSESOPHAGEAL  ECHOCARDIOGRAM;  Surgeon: Delight Ovens, MD;  Location: [facility] OR;  Service: Open Heart Surgery;  Laterality: N/A;   LITHOTRIPSY  01/11/2008   TONSILLECTOMY     Social History   Socioeconomic History   Marital status: Married    Spouse name: Not on file   Number of children: Not on file   Years of education: Not on file   Highest education level: Some college, no degree  Occupational History   Occupation: retired  Tobacco Use   Smoking status: Former    Current packs/day: 0.00    Average packs/day: 1.5 packs/day for 37.0 years (55.5 ttl pk-yrs)    Types: Cigarettes    Start date: 06/06/1975    Quit date: 06/05/2012    Years since quitting: 10.5   Smokeless tobacco: Never  Vaping Use   Vaping status: Never Used  Substance and Sexual Activity   Alcohol use: No   Drug use: No   Sexual activity: Yes    Birth control/protection: None  Other Topics Concern   Not on file  Social History Narrative   Not on file   Social Drivers of Health   Financial Resource Strain: Low Risk  (10/14/2022)   Overall Financial Resource Strain (CARDIA)    Difficulty of Paying Living Expenses: Not hard at all  Food Insecurity: No Food Insecurity (10/14/2022)   Hunger Vital Sign    Worried About Running Out of Food in the Last Year: Never true    Ran Out of Food in the Last Year: Never true  Transportation Needs: No Transportation Needs (10/14/2022)   PRAPARE - Administrator, Civil Service (Medical): No    Lack of Transportation (Non-Medical): No  Physical Activity: Sufficiently Active (10/14/2022)   Exercise Vital Sign    Days of Exercise per Week: 3 days    Minutes of Exercise per Session: 150+ min  Stress: No Stress Concern Present (10/14/2022)   [employer] of Occupational Health - Occupational Stress Questionnaire    Feeling of Stress : Not at all  Social Connections: Socially Integrated (10/14/2022)   Social Connection and Isolation Panel [NHANES]    Frequency of  Communication with Friends and Family: More than three times a week    Frequency of Social Gatherings with Friends and Family: More than three times a week    Attends Religious Services: More than 4 times per year    Active Member of [employer] or Organizations: Yes    Attends Banker Meetings: More than 4 times per year    Marital Status: Married  Catering manager Violence: Not At Risk (04/14/2021)   Humiliation, Afraid, Rape, and Kick questionnaire    Fear of Current or Ex-Partner: No    Emotionally Abused: No    Physically  Abused: No    Sexually Abused: No   Family Status  Relation Name Status   Mother  Alive   Father MI Deceased   Brother  Alive   Other  (Not Specified)  No partnership data on file   Family History  Problem Relation Age of Onset   Hypertension Other    Allergies  Allergen Reactions   Sulfa Antibiotics     Other reaction(s): Unknown "Makes him feel worse then better when taken"per wife    Patient Care Team: Debera Lat, PA-C as PCP - General (Physician Assistant) Dalia Heading, MD as Attending Physician (Cardiology)   Medications: Outpatient Medications Prior to Visit  Medication Sig   aspirin EC (ASPIRIN 81) 81 MG tablet Take 81 mg by mouth daily. Swallow whole.   azelastine (ASTELIN) 0.1 % nasal spray Place 2 sprays into both nostrils 2 (two) times daily. Use in each nostril as directed   cetirizine (ZYRTEC) 10 MG tablet Take 10 mg by mouth every evening.   chlorpheniramine-HYDROcodone (TUSSIONEX) 10-8 MG/5ML Take 5 mLs by mouth at bedtime as needed for cough.   Cholecalciferol (VITAMIN D3) 1.25 MG (50000 UT) CAPS Vitamin D3   ezetimibe (ZETIA) 10 MG tablet Take 1 tablet (10 mg total) by mouth daily.   finasteride (PROSCAR) 5 MG tablet Take 1 tablet (5 mg total) by mouth daily.   fluticasone (FLONASE) 50 MCG/ACT nasal spray fluticasone propionate 50 mcg/actuation nasal spray,suspension  SPRAY TWICE IN EACH NOSTRIL ONCE DAILY    losartan-hydrochlorothiazide (HYZAAR) 100-12.5 MG tablet Take 1 tablet by mouth daily.   metoprolol succinate (TOPROL-XL) 50 MG 24 hr tablet Take 1 tablet (50 mg total) by mouth daily. Take with or immediately following a meal.   pantoprazole (PROTONIX) 20 MG tablet Take 20 mg by mouth daily.   rosuvastatin (CRESTOR) 20 MG tablet Take 1 tablet (20 mg total) by mouth daily.   tamsulosin (FLOMAX) 0.4 MG CAPS capsule TAKE 1 CAPSULE BY MOUTH ONCE DAILY   No facility-administered medications prior to visit.    Review of Systems  All other systems reviewed and are negative.  Except see HPI     Objective    BP 118/76 (BP Location: Right Arm, Patient Position: Sitting, Cuff Size: Large)   Pulse 69   Ht 5\' 6"  (1.676 m)   Wt 230 lb (104.3 kg)   SpO2 96%   BMI 37.12 kg/m      Physical Exam Vitals reviewed.  Constitutional:      General: He is not in acute distress.    Appearance: Normal appearance. He is well-developed. He is obese. He is not ill-appearing, toxic-appearing or diaphoretic.  HENT:     Head: Normocephalic and atraumatic.     Right Ear: Tympanic membrane, ear canal and external ear normal.     Left Ear: Tympanic membrane, ear canal and external ear normal.     Nose: Nose normal. No congestion or rhinorrhea.     Mouth/Throat:     Mouth: Mucous membranes are moist.     Pharynx: Oropharynx is clear. No oropharyngeal exudate.  Eyes:     General: No scleral icterus.       Right eye: No discharge.        Left eye: No discharge.     Conjunctiva/sclera: Conjunctivae normal.     Pupils: Pupils are equal, round, and reactive to light.  Neck:     Thyroid: No thyromegaly.     Vascular: No carotid bruit.  Cardiovascular:  Rate and Rhythm: Normal rate and regular rhythm.     Pulses: Normal pulses.     Heart sounds: Normal heart sounds. No murmur heard.    No friction rub. No gallop.  Pulmonary:     Effort: Pulmonary effort is normal. No respiratory distress.      Breath sounds: Normal breath sounds. No wheezing or rales.  Abdominal:     General: Abdomen is flat. Bowel sounds are normal. There is no distension.     Palpations: Abdomen is soft. There is no mass.     Tenderness: There is no abdominal tenderness. There is no right CVA tenderness, left CVA tenderness, guarding or rebound.     Hernia: No hernia is present.  Musculoskeletal:        General: No swelling, tenderness, deformity or signs of injury. Normal range of motion.     Cervical back: Normal range of motion and neck supple. No rigidity or tenderness.     Right lower leg: No edema.     Left lower leg: No edema.  Lymphadenopathy:     Cervical: No cervical adenopathy.  Skin:    General: Skin is warm and dry.     Coloration: Skin is not jaundiced or pale.     Findings: No bruising, erythema, lesion or rash.  Neurological:     Mental Status: He is alert and oriented to person, place, and time. Mental status is at baseline.     Gait: Gait normal.  Psychiatric:        Mood and Affect: Mood normal.        Behavior: Behavior normal.        Thought Content: Thought content normal.        Judgment: Judgment normal.      Results for orders placed or performed in visit on 12/20/22  CBC with Differential/Platelet  Result Value Ref Range   WBC 11.7 (H) 3.4 - 10.8 x10E3/uL   RBC 5.60 4.14 - 5.80 x10E6/uL   Hemoglobin 17.7 13.0 - 17.7 g/dL   Hematocrit 16.1 (H) 09.6 - 51.0 %   MCV 95 79 - 97 fL   MCH 31.6 26.6 - 33.0 pg   MCHC 33.2 31.5 - 35.7 g/dL   RDW 04.5 40.9 - 81.1 %   Platelets 231 150 - 450 x10E3/uL   Neutrophils 74 Not Estab. %   Lymphs 19 Not Estab. %   Monocytes 6 Not Estab. %   Eos 1 Not Estab. %   Basos 0 Not Estab. %   Neutrophils Absolute 8.6 (H) 1.4 - 7.0 x10E3/uL   Lymphocytes Absolute 2.2 0.7 - 3.1 x10E3/uL   Monocytes Absolute 0.7 0.1 - 0.9 x10E3/uL   EOS (ABSOLUTE) 0.2 0.0 - 0.4 x10E3/uL   Basophils Absolute 0.1 0.0 - 0.2 x10E3/uL   Immature Granulocytes 0 Not  Estab. %   Immature Grans (Abs) 0.0 0.0 - 0.1 x10E3/uL  Comprehensive metabolic panel  Result Value Ref Range   Glucose 104 (H) 70 - 99 mg/dL   BUN 21 8 - 27 mg/dL   Creatinine, Ser 9.14 0.76 - 1.27 mg/dL   eGFR 73 >78 GN/FAO/1.30   BUN/Creatinine Ratio 19 10 - 24   Sodium 142 134 - 144 mmol/L   Potassium 4.2 3.5 - 5.2 mmol/L   Chloride 99 96 - 106 mmol/L   CO2 26 20 - 29 mmol/L   Calcium 9.7 8.6 - 10.2 mg/dL   Total Protein 6.8 6.0 - 8.5 g/dL   Albumin 4.3  3.9 - 4.9 g/dL   Globulin, Total 2.5 1.5 - 4.5 g/dL   Bilirubin Total 1.4 (H) 0.0 - 1.2 mg/dL   Alkaline Phosphatase 81 44 - 121 IU/L   AST 27 0 - 40 IU/L   ALT 33 0 - 44 IU/L  Lipid panel  Result Value Ref Range   Cholesterol, Total 103 100 - 199 mg/dL   Triglycerides 93 0 - 149 mg/dL   HDL 39 (L) >16 mg/dL   VLDL Cholesterol Cal 18 5 - 40 mg/dL   LDL Chol Calc (NIH) 46 0 - 99 mg/dL   Chol/HDL Ratio 2.6 0.0 - 5.0 ratio  TSH  Result Value Ref Range   TSH 1.260 0.450 - 4.500 uIU/mL  PSA Total (Reflex To Free) ([facility] only)  Result Value Ref Range   Prostate Specific Ag, Serum 1.4 0.0 - 4.0 ng/mL   Reflex Criteria Comment     Assessment & Plan    Routine Health Maintenance and Physical Exam  Exercise Activities and Dietary recommendations  Goals      DIET - EAT MORE FRUITS AND VEGETABLES        Immunization History  Administered Date(s) Administered   Tdap 12/21/2011    Health Maintenance  Topic Date Due   Pneumonia Vaccine (1 of 2 - PCV) Never done   Screening for Lung Cancer  Never done   Zoster (Shingles) Vaccine (1 of 2) Never done   DTaP/Tdap/Td vaccine (2 - Td or Tdap) 12/20/2021   COVID-19 Vaccine (1 - 2024-25 season) Never done   Flu Shot  04/04/2023*   Medicare Annual Wellness Visit  04/19/2023   Colon Cancer Screening  01/10/2030   Hepatitis C Screening  Completed   HPV Vaccine  Aged Out  *Topic was postponed. The date shown is not the original due date.    Discussed health benefits of  physical activity, and encouraged him to engage in regular exercise appropriate for his age and condition.  1. Annual physical exam (Primary) UTD on dental/eye Things to do to keep yourself healthy  - Exercise at least 30-45 minutes a day, 3-4 days a week.  - Eat a low-fat diet with lots of fruits and vegetables, up to 7-9 servings per day.  - Seatbelts can save your life. Wear them always.  - Smoke detectors on every level of your home, check batteries every year.  - Eye Doctor - have an eye exam every 1-2 years  - Safe sex - if you may be exposed to STDs, use a condom.  - Alcohol -  If you drink, do it moderately, less than 2 drinks per day.  - Health Care Power of Attorney. Choose someone to speak for you if you are not able.  - Depression is common in our stressful world.If you're feeling down or losing interest in things you normally enjoy, please come in for a visit.  - Violence - If anyone is threatening or hurting you, please call immediately.  - CBC with Differential/Platelet - Comprehensive metabolic panel - Lipid panel - TSH Will follow-up  Obesity (BMI 30-39.9) Chronica with Body mass index is 37.12 kg/m. Weight Management Stable weight, on regular diet, moderate activity level. -Consider dietary modifications for better weight management. Workup - CBC with Differential/Platelet - Comprehensive metabolic panel - Lipid panel - TSH Will reassess  Prostate cancer screening Low risk screening. Asymptomatic - PSA Total (Reflex To Free) ([facility] only) Prescription for Tdap. Vaccine not administered in office.  - Tdap (BOOSTRIX)  5-2.5-18.5 LF-MCG/0.5 injection; Inject 0.5 mLs into the muscle once for 1 dose.  Dispense: 0.5 mL; Refill: 0    Chronic Allergic Rhinitis Persistent symptoms of post-nasal drip, runny eyes, and dryness in the outer ear canal. Currently on daily Zyrtec with partial relief. -Start nasal saline rinse daily. -Add Flonase one puff twice daily in  each nostril for one month, then decrease to one puff once daily. -Consider referral to Allergy and Immunology for further evaluation and possible immunotherapy if symptoms persist.  Skin Lesions Solar damage noted, with annual dermatology follow-ups. -Continue annual dermatology follow-ups.  Hearing/Ears Complaint of dryness and itching in the outer ear canal. -Use Debrox for wax removal and cleansing. Will consider ENT if symptoms persist  [facility] ophthalmology follow-ups, complaint of watery eyes diagnosed as dry eyes. -Continue annual ophthalmology follow-ups.  General Health Maintenance -Order comprehensive blood work including PSA and TSH. -Consider Pneumonia and Shingles vaccines. -Consider Lung Cancer screening due to history of smoking. -Schedule next appointment in one year, or in six months if changes in medication are needed. -Consider Tetanus shot if not updated in the past 10 years.  Hypertension and Hyperlipidemia No current complaints, blood pressure well controlled today. -Continue current management, monitor blood pressure regularly.      Return for CPE    The patient was advised to call back or seek an in-person evaluation if the symptoms worsen or if the condition fails to improve as anticipated.  I discussed the assessment and treatment plan with the patient. The patient was provided an opportunity to ask questions and all were answered. The patient agreed with the plan and demonstrated an understanding of the instructions.  I, Debera Lat, PA-C have reviewed all documentation for this visit. The documentation on 12/20/2022  for the exam, diagnosis, procedures, and orders are all accurate and complete.  Debera Lat, [facility], MMS [facility] [phone] (phone) [phone] (fax)  [facility]

## 2022-12-21 LAB — COMPREHENSIVE METABOLIC PANEL
ALT: 33 [IU]/L (ref 0–44)
AST: 27 [IU]/L (ref 0–40)
Albumin: 4.3 g/dL (ref 3.9–4.9)
Alkaline Phosphatase: 81 [IU]/L (ref 44–121)
BUN/Creatinine Ratio: 19 (ref 10–24)
BUN: 21 mg/dL (ref 8–27)
Bilirubin Total: 1.4 mg/dL — ABNORMAL HIGH (ref 0.0–1.2)
CO2: 26 mmol/L (ref 20–29)
Calcium: 9.7 mg/dL (ref 8.6–10.2)
Chloride: 99 mmol/L (ref 96–106)
Creatinine, Ser: 1.1 mg/dL (ref 0.76–1.27)
Globulin, Total: 2.5 g/dL (ref 1.5–4.5)
Glucose: 104 mg/dL — ABNORMAL HIGH (ref 70–99)
Potassium: 4.2 mmol/L (ref 3.5–5.2)
Sodium: 142 mmol/L (ref 134–144)
Total Protein: 6.8 g/dL (ref 6.0–8.5)
eGFR: 73 mL/min/{1.73_m2} (ref >59)

## 2022-12-21 LAB — TSH: TSH: 1.26 u[IU]/mL (ref 0.450–4.500)

## 2022-12-21 LAB — CBC WITH DIFFERENTIAL/PLATELET
Basophils Absolute: 0.1 10*3/uL (ref 0.0–0.2)
Basos: 0 %
EOS (ABSOLUTE): 0.2 10*3/uL (ref 0.0–0.4)
Eos: 1 %
Hematocrit: 53.3 % — ABNORMAL HIGH (ref 37.5–51.0)
Hemoglobin: 17.7 g/dL (ref 13.0–17.7)
Immature Grans (Abs): 0 10*3/uL (ref 0.0–0.1)
Immature Granulocytes: 0 %
Lymphocytes Absolute: 2.2 10*3/uL (ref 0.7–3.1)
Lymphs: 19 %
MCH: 31.6 pg (ref 26.6–33.0)
MCHC: 33.2 g/dL (ref 31.5–35.7)
MCV: 95 fL (ref 79–97)
Monocytes Absolute: 0.7 10*3/uL (ref 0.1–0.9)
Monocytes: 6 %
Neutrophils Absolute: 8.6 10*3/uL — ABNORMAL HIGH (ref 1.4–7.0)
Neutrophils: 74 %
Platelets: 231 10*3/uL (ref 150–450)
RBC: 5.6 x10E6/uL (ref 4.14–5.80)
RDW: 11.9 % (ref 11.6–15.4)
WBC: 11.7 10*3/uL — ABNORMAL HIGH (ref 3.4–10.8)

## 2022-12-21 LAB — PSA TOTAL (REFLEX TO FREE): Prostate Specific Ag, Serum: 1.4 ng/mL (ref 0.0–4.0)

## 2022-12-21 LAB — LIPID PANEL
Chol/HDL Ratio: 2.6 {ratio} (ref 0.0–5.0)
Cholesterol, Total: 103 mg/dL (ref 100–199)
HDL: 39 mg/dL — ABNORMAL LOW (ref >39)
LDL Chol Calc (NIH): 46 mg/dL (ref 0–99)
Triglycerides: 93 mg/dL (ref 0–149)
VLDL Cholesterol Cal: 18 mg/dL (ref 5–40)

## 2022-12-27 ENCOUNTER — Other Ambulatory Visit: Payer: Self-pay | Admitting: Cardiovascular Disease

## 2023-02-08 ENCOUNTER — Telehealth: Payer: PPO | Admitting: Physician Assistant

## 2023-02-08 DIAGNOSIS — J208 Acute bronchitis due to other specified organisms: Secondary | ICD-10-CM | POA: Diagnosis not present

## 2023-02-08 DIAGNOSIS — B9689 Other specified bacterial agents as the cause of diseases classified elsewhere: Secondary | ICD-10-CM

## 2023-02-08 MED ORDER — DOXYCYCLINE HYCLATE 100 MG PO TABS: 100.0000 mg | ORAL_TABLET | Freq: Two times a day (BID) | ORAL | 0 refills | Status: DC

## 2023-02-08 MED ORDER — BENZONATATE 100 MG PO CAPS: 100.0000 mg | ORAL_CAPSULE | Freq: Three times a day (TID) | ORAL | 0 refills | Status: DC | PRN

## 2023-02-08 NOTE — Patient Instructions (Signed)
 [name], thank you for joining [name], PA-C for today's virtual visit.  While this provider is not your primary care provider (PCP), if your PCP is located in our provider database this encounter information will be shared with them immediately following your visit.   A [facility] MyChart account gives you access to today's visit and all your visits, tests, and labs performed at [facility]  click here if you don't have a [facility] MyChart account or go to [email or web address]  Consent: (Patient) [name] provided verbal consent for this virtual visit at the beginning of the encounter.  Current Medications:  Current Outpatient Medications:    aspirin  EC (ASPIRIN  81) 81 MG tablet, Take 81 mg by mouth daily. Swallow whole., Disp: , Rfl:    azelastine  (ASTELIN ) 0.1 % nasal spray, Place 2 sprays into both nostrils 2 (two) times daily. Use in each nostril as directed, Disp: 30 mL, Rfl: 2   cetirizine (ZYRTEC) 10 MG tablet, Take 10 mg by mouth every evening., Disp: , Rfl:    chlorpheniramine-HYDROcodone  (TUSSIONEX) 10-8 MG/5ML, Take 5 mLs by mouth at bedtime as needed for cough., Disp: 70 mL, Rfl: 0   Cholecalciferol  (VITAMIN D3) 1.25 MG (50000 UT) CAPS, Vitamin D3, Disp: , Rfl:    ezetimibe  (ZETIA ) 10 MG tablet, Take 1 tablet (10 mg total) by mouth daily., Disp: 90 tablet, Rfl: 3   finasteride  (PROSCAR ) 5 MG tablet, Take 1 tablet (5 mg total) by mouth daily., Disp: 90 tablet, Rfl: 1   fluticasone (FLONASE) 50 MCG/ACT nasal spray, fluticasone propionate 50 mcg/actuation nasal spray,suspension  SPRAY TWICE IN EACH NOSTRIL ONCE DAILY, Disp: , Rfl:    losartan -hydrochlorothiazide (HYZAAR) 100-12.5 MG tablet, TAKE 1 TABLET BY MOUTH DAILY., Disp: 90 tablet, Rfl: 0   metoprolol  succinate (TOPROL -XL) 50 MG 24 hr tablet, TAKE 1 TABLET BY MOUTH DAILY. TAKE WITH OR IMMEDIATELY FOLLOWING A MEAL., Disp: 90 tablet, Rfl: 0   pantoprazole  (PROTONIX ) 20 MG tablet, Take 20  mg by mouth daily., Disp: , Rfl:    rosuvastatin  (CRESTOR ) 20 MG tablet, Take 1 tablet (20 mg total) by mouth daily., Disp: 90 tablet, Rfl: 3   tamsulosin  (FLOMAX ) 0.4 MG CAPS capsule, TAKE 1 CAPSULE BY MOUTH ONCE DAILY, Disp: 90 capsule, Rfl: 1   Medications ordered in this encounter:  No orders of the defined types were placed in this encounter.    *If you need refills on other medications prior to your next appointment, please contact your pharmacy*  Follow-Up: Call back or seek an in-person evaluation if the symptoms worsen or if the condition fails to improve as anticipated.  [facility] [phone]  Other Instructions Please take antibiotic as directed.  Increase fluid intake.  Use Saline nasal spray.  Take a daily multivitamin. Restart the Flonase. Use the Tessalon  along with the Mucinex-DM.  Place a humidifier in the bedroom.  Please call or return clinic if symptoms are not improving.  Sinusitis Sinusitis is redness, soreness, and swelling (inflammation) of the paranasal sinuses. Paranasal sinuses are air pockets within the bones of your face (beneath the eyes, the middle of the forehead, or above the eyes). In healthy paranasal sinuses, mucus is able to drain out, and air is able to circulate through them by way of your nose. However, when your paranasal sinuses are inflamed, mucus and air can become trapped. This can allow bacteria and other germs to grow and cause infection. Sinusitis can develop quickly and last only a  short time (acute) or continue over a long period (chronic). Sinusitis that lasts for more than 12 weeks is considered chronic.  CAUSES  Causes of sinusitis include: Allergies. Structural abnormalities, such as displacement of the cartilage that separates your nostrils (deviated septum), which can decrease the air flow through your nose and sinuses and affect sinus drainage. Functional abnormalities, such as when the small hairs (cilia) that line  your sinuses and help remove mucus do not work properly or are not present. SYMPTOMS  Symptoms of acute and chronic sinusitis are the same. The primary symptoms are pain and pressure around the affected sinuses. Other symptoms include: Upper toothache. Earache. Headache. Bad breath. Decreased sense of smell and taste. A cough, which worsens when you are lying flat. Fatigue. Fever. Thick drainage from your nose, which often is green and may contain pus (purulent). Swelling and warmth over the affected sinuses. DIAGNOSIS  Your caregiver will perform a physical exam. During the exam, your caregiver may: Look in your nose for signs of abnormal growths in your nostrils (nasal polyps). Tap over the affected sinus to check for signs of infection. View the inside of your sinuses (endoscopy) with a special imaging device with a light attached (endoscope), which is inserted into your sinuses. If your caregiver suspects that you have chronic sinusitis, one or more of the following tests may be recommended: Allergy tests. Nasal culture A sample of mucus is taken from your nose and sent to a lab and screened for bacteria. Nasal cytology A sample of mucus is taken from your nose and examined by your caregiver to determine if your sinusitis is related to an allergy. TREATMENT  Most cases of acute sinusitis are related to a viral infection and will resolve on their own within 10 days. Sometimes medicines are prescribed to help relieve symptoms (pain medicine, decongestants, nasal steroid sprays, or saline sprays).  However, for sinusitis related to a bacterial infection, your caregiver will prescribe antibiotic medicines. These are medicines that will help kill the bacteria causing the infection.  Rarely, sinusitis is caused by a fungal infection. In theses cases, your caregiver will prescribe antifungal medicine. For some cases of chronic sinusitis, surgery is needed. Generally, these are cases in which  sinusitis recurs more than 3 times per year, despite other treatments. HOME CARE INSTRUCTIONS  Drink plenty of water. Water helps thin the mucus so your sinuses can drain more easily. Use a humidifier. Inhale steam 3 to 4 times a day (for example, sit in the bathroom with the shower running). Apply a warm, moist washcloth to your face 3 to 4 times a day, or as directed by your caregiver. Use saline nasal sprays to help moisten and clean your sinuses. Take over-the-counter or prescription medicines for pain, discomfort, or fever only as directed by your caregiver. SEEK IMMEDIATE MEDICAL CARE IF: You have increasing pain or severe headaches. You have nausea, vomiting, or drowsiness. You have swelling around your face. You have vision problems. You have a stiff neck. You have difficulty breathing. MAKE SURE YOU:  Understand these instructions. Will watch your condition. Will get help right away if you are not doing well or get worse. Document Released: 12/21/2004 Document Revised: 03/15/2011 Document Reviewed: 01/05/2011 [facility] Patient Information 2014 [place], [place].    If you have been instructed to have an in-person evaluation today at a local Urgent Care facility, please use the link below. It will take you to a list of all of our available [facility]gent Cares,  including address, phone number and hours of operation. Please do not delay care.  [facility]  If you or a family member do not have a primary care provider, use the link below to schedule a visit and establish care. When you choose a [facility] primary care physician or advanced practice provider, you gain a long-term partner in health. Find a Primary Care Provider  Learn more about [facility]'s in-office and virtual care options: [facility] - Get Care Now

## 2023-02-08 NOTE — Progress Notes (Signed)
 Virtual Visit Consent   [name], you are scheduled for a virtual visit with a [facility] provider today. Just as with appointments in the office, your consent must be obtained to participate. Your consent will be active for this visit and any virtual visit you may have with one of our providers in the next 365 days. If you have a MyChart account, a copy of this consent can be sent to you electronically.  As this is a virtual visit, video technology does not allow for your provider to perform a traditional examination. This may limit your provider's ability to fully assess your condition. If your provider identifies any concerns that need to be evaluated in person or the need to arrange testing (such as labs, EKG, etc.), we will make arrangements to do so. Although advances in technology are sophisticated, we cannot ensure that it will always work on either your end or our end. If the connection with a video visit is poor, the visit may have to be switched to a telephone visit. With either a video or telephone visit, we are not always able to ensure that we have a secure connection.  By engaging in this virtual visit, you consent to the provision of healthcare and authorize for your insurance to be billed (if applicable) for the services provided during this visit. Depending on your insurance coverage, you may receive a charge related to this service.  I need to obtain your verbal consent now. Are you willing to proceed with your visit today? [name] has provided verbal consent on 02/08/2023 for a virtual visit (video or telephone). [name], [place]  Date: 02/08/2023 8:47 AM  Virtual Visit via Video Note   I, [name], connected with  [name]  ([id number], 08-09-1955) on 02/08/23 at  8:45 AM EST by a video-enabled telemedicine application and verified that I am speaking with the correct person using two identifiers.  Location: Patient: Virtual Visit Location  Patient: Home Provider: Virtual Visit Location Provider: Home Office   I discussed the limitations of evaluation and management by telemedicine and the availability of in person appointments. The patient expressed understanding and agreed to proceed.    History of Present Illness: [name] is a 68 y.o. who identifies as a male who was assigned male at birth, and is being seen today for URI symptoms for the past 3-4 weeks. Patient endorses progressive cough and congestion with thick phlegm and now low-grade fever over the past 3-4 days. Some PND now sigh sinus pain. 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   H/O coronary artery bypass surgery 07/25/2014   Personal history of infectious and parasitic disease 07/25/2014   Hypercholesteremia 07/25/2014   Blood glucose elevated 07/25/2014   Personal history of urinary calculi 07/25/2014   Cervical pain 07/25/2014   Adiposity 07/25/2014   Hypercholesterolemia without hypertriglyceridemia 07/25/2014   Vitamin D  deficiency 07/25/2014   Hypertensive urgency 07/19/2014  Left main coronary artery disease 06/05/2012   Coronary artery disease 06/05/2012   History of tobacco abuse 06/05/2012   Hyperlipidemia 06/05/2012   Obesity (BMI 30-39.9) 06/05/2012   Obstructive sleep apnea 06/05/2012   GERD (gastroesophageal reflux disease) 06/05/2012    Allergies:  Allergies  Allergen Reactions    Sulfa Antibiotics     Other reaction(s): Unknown Makes him feel worse then better when takenper wife   Medications:  Current Outpatient Medications:    benzonatate  (TESSALON ) 100 MG capsule, Take 1 capsule (100 mg total) by mouth 3 (three) times daily as needed for cough., Disp: 30 capsule, Rfl: 0   doxycycline  (VIBRA -TABS) 100 MG tablet, Take 1 tablet (100 mg total) by mouth 2 (two) times daily., Disp: 20 tablet, Rfl: 0   aspirin  EC (ASPIRIN  81) 81 MG tablet, Take 81 mg by mouth daily. Swallow whole., Disp: , Rfl:    azelastine  (ASTELIN ) 0.1 % nasal spray, Place 2 sprays into both nostrils 2 (two) times daily. Use in each nostril as directed, Disp: 30 mL, Rfl: 2   cetirizine (ZYRTEC) 10 MG tablet, Take 10 mg by mouth every evening., Disp: , Rfl:    Cholecalciferol  (VITAMIN D3) 1.25 MG (50000 UT) CAPS, Vitamin D3, Disp: , Rfl:    ezetimibe  (ZETIA ) 10 MG tablet, Take 1 tablet (10 mg total) by mouth daily., Disp: 90 tablet, Rfl: 3   finasteride  (PROSCAR ) 5 MG tablet, Take 1 tablet (5 mg total) by mouth daily., Disp: 90 tablet, Rfl: 1   fluticasone (FLONASE) 50 MCG/ACT nasal spray, fluticasone propionate 50 mcg/actuation nasal spray,suspension  SPRAY TWICE IN EACH NOSTRIL ONCE DAILY, Disp: , Rfl:    losartan -hydrochlorothiazide (HYZAAR) 100-12.5 MG tablet, TAKE 1 TABLET BY MOUTH DAILY., Disp: 90 tablet, Rfl: 0   metoprolol  succinate (TOPROL -XL) 50 MG 24 hr tablet, TAKE 1 TABLET BY MOUTH DAILY. TAKE WITH OR IMMEDIATELY FOLLOWING A MEAL., Disp: 90 tablet, Rfl: 0   pantoprazole  (PROTONIX ) 20 MG tablet, Take 20 mg by mouth daily., Disp: , Rfl:    rosuvastatin  (CRESTOR ) 20 MG tablet, Take 1 tablet (20 mg total) by mouth daily., Disp: 90 tablet, Rfl: 3   tamsulosin  (FLOMAX ) 0.4 MG CAPS capsule, TAKE 1 CAPSULE BY MOUTH ONCE DAILY, Disp: 90 capsule, Rfl: 1  Observations/Objective: Patient is well-developed, well-nourished in no acute distress.  Resting comfortably at home.  Head is normocephalic,  atraumatic.  No labored breathing. Speech is clear and coherent with logical content.  Patient is alert and oriented at baseline.   Assessment and Plan: 1. Acute bacterial bronchitis (Primary) - doxycycline  (VIBRA -TABS) 100 MG tablet; Take 1 tablet (100 mg total) by mouth 2 (two) times daily.  Dispense: 20 tablet; Refill: 0 - benzonatate  (TESSALON ) 100 MG capsule; Take 1 capsule (100 mg total) by mouth 3 (three) times daily as needed for cough.  Dispense: 30 capsule; Refill: 0  Rx Doxycycline .  Increase fluids.  Rest.  Saline nasal spray.  Probiotic.  Mucinex as directed.  Humidifier in bedroom. Tessalon  per orders. Restart Flonase.  Call or return to clinic if symptoms are not improving.   Follow Up Instructions: I discussed the assessment and treatment plan with the patient. The patient was provided an opportunity to ask questions and all were answered. The patient agreed with the plan and demonstrated an understanding of the instructions.  A copy of instructions were sent to the patient via MyChart unless otherwise noted below.   The patient was advised to call back or seek an in-person evaluation  if the symptoms worsen or if the condition fails to improve as anticipated.    Thomas Velma Lunger, PA-C

## 2023-03-28 ENCOUNTER — Other Ambulatory Visit: Payer: Self-pay

## 2023-03-28 ENCOUNTER — Encounter: Payer: Self-pay | Admitting: Physician Assistant

## 2023-03-28 DIAGNOSIS — N401 Enlarged prostate with lower urinary tract symptoms: Secondary | ICD-10-CM

## 2023-03-28 MED ORDER — TAMSULOSIN HCL 0.4 MG PO CAPS: 0.4000 mg | ORAL_CAPSULE | Freq: Every day | ORAL | 1 refills | Status: DC

## 2023-03-29 ENCOUNTER — Other Ambulatory Visit: Payer: Self-pay | Admitting: Cardiovascular Disease

## 2023-03-29 ENCOUNTER — Telehealth: Payer: Self-pay

## 2023-03-29 DIAGNOSIS — N401 Enlarged prostate with lower urinary tract symptoms: Secondary | ICD-10-CM

## 2023-03-29 MED ORDER — TAMSULOSIN HCL 0.4 MG PO CAPS: 0.4000 mg | ORAL_CAPSULE | Freq: Every day | ORAL | 1 refills | Status: DC

## 2023-03-29 NOTE — Addendum Note (Signed)
 Addended by: Liz Beach on: 03/29/2023 01:30 PM   Modules accepted: Orders

## 2023-03-29 NOTE — Telephone Encounter (Signed)
 Copied from CRM [id number]. Topic: Clinical - Medication Refill >> Mar 28, 2023  4:45 PM Everette C wrote: Most Recent Primary Care Visit:  Provider: Debera Lat  Department: [id number]  Visit Type: PHYSICAL  Date: 12/20/2022  Medication: tamsulosin (FLOMAX) 0.4 MG CAPS capsule [[id number]]  Has the patient contacted their pharmacy? Yes (Agent: If no, request that the patient contact the pharmacy for the refill. If patient does not wish to contact the pharmacy document the reason why and proceed with request.) (Agent: If yes, when and what did the pharmacy advise?)  Is this the correct pharmacy for this prescription? Yes If no, delete pharmacy and type the correct one.  This is the patient's preferred pharmacy:  [facility] - [place], [place] - [address] [place] [address] Phone: [phone] Fax: [phone]   Has the prescription been filled recently? No  Is the patient out of the medication? Yes  Has the patient been seen for an appointment in the last year OR does the patient have an upcoming appointment? Yes  Can we respond through MyChart? No  Agent: Please be advised that Rx refills may take up to 3 business days. We ask that you follow-up with your pharmacy.  Patient shares that their prescription was previously submitted to the incorrect pharmacy

## 2023-04-13 ENCOUNTER — Ambulatory Visit

## 2023-04-13 DIAGNOSIS — Z0001 Encounter for general adult medical examination with abnormal findings: Secondary | ICD-10-CM | POA: Diagnosis not present

## 2023-04-13 DIAGNOSIS — Z0000 Encounter for general adult medical examination without abnormal findings: Secondary | ICD-10-CM

## 2023-04-13 NOTE — Progress Notes (Signed)
 Subjective:   [name] is a 68 y.o. who presents for a Medicare Wellness preventive visit.  Visit Complete: Virtual I connected with  [name] on 04/13/23 by a audio enabled telemedicine application and verified that I am speaking with the correct person using two identifiers.  Patient Location: Home  Provider Location: Office/Clinic  I discussed the limitations of evaluation and management by telemedicine. The patient expressed understanding and agreed to proceed.  Vital Signs: Because this visit was a virtual/telehealth visit, some criteria may be missing or patient reported. Any vitals not documented were not able to be obtained and vitals that have been documented are patient reported.  VideoDeclined- This patient declined Librarian, academic. Therefore the visit was completed with audio only.  Persons Participating in Visit: Patient.  AWV Questionnaire: No: Patient Medicare AWV questionnaire was not completed prior to this visit.  Cardiac Risk Factors include: advanced age (>8men, >72 women);male gender;dyslipidemia;obesity (BMI >30kg/m2)     Objective:    There were no vitals filed for this visit. There is no height or weight on file to calculate BMI.     04/13/2023    9:53 AM 04/19/2022    8:29 AM 04/14/2021    9:07 AM 02/09/2021   11:43 AM 01/11/2020   11:02 AM 08/08/2014    9:56 AM 07/19/2014    2:08 AM  Advanced Directives  Does Patient Have a Medical Advance Directive? No Yes No No Yes Yes Yes  Type of Agricultural consultant;Living will Living will Living will  Does patient want to make changes to medical advance directive?       No - Patient declined  Copy of Healthcare Power of Attorney in Chart?       No - copy requested  Would patient like information on creating a medical advance directive? No - Patient declined  No - Patient declined    No - patient declined information    Current Medications  (verified) Outpatient Encounter Medications as of 04/13/2023  Medication Sig   aspirin EC (ASPIRIN 81) 81 MG tablet Take 81 mg by mouth daily. Swallow whole.   azelastine (ASTELIN) 0.1 % nasal spray Place 2 sprays into both nostrils 2 (two) times daily. Use in each nostril as directed   cetirizine (ZYRTEC) 10 MG tablet Take 10 mg by mouth every evening.   Cholecalciferol (VITAMIN D3) 1.25 MG (50000 UT) CAPS Vitamin D3   finasteride (PROSCAR) 5 MG tablet Take 1 tablet (5 mg total) by mouth daily.   fluticasone (FLONASE) 50 MCG/ACT nasal spray fluticasone propionate 50 mcg/actuation nasal spray,suspension  SPRAY TWICE IN EACH NOSTRIL ONCE DAILY   losartan-hydrochlorothiazide (HYZAAR) 100-12.5 MG tablet TAKE 1 TABLET BY MOUTH DAILY.   metoprolol succinate (TOPROL-XL) 50 MG 24 hr tablet TAKE 1 TABLET BY MOUTH DAILY. TAKE WITH OR IMMEDIATELY FOLLOWING A MEAL.   rosuvastatin (CRESTOR) 20 MG tablet Take 1 tablet (20 mg total) by mouth daily.   tamsulosin (FLOMAX) 0.4 MG CAPS capsule Take 1 capsule (0.4 mg total) by mouth daily.   benzonatate (TESSALON) 100 MG capsule Take 1 capsule (100 mg total) by mouth 3 (three) times daily as needed for cough. (Patient not taking: Reported on 04/13/2023)   doxycycline (VIBRA-TABS) 100 MG tablet Take 1 tablet (100 mg total) by mouth 2 (two) times daily. (Patient not taking: Reported on 04/13/2023)   ezetimibe (ZETIA) 10 MG tablet TAKE 1 TABLET BY MOUTH DAILY. (Patient not taking:  Reported on 04/13/2023)   pantoprazole (PROTONIX) 20 MG tablet Take 20 mg by mouth daily. (Patient not taking: Reported on 04/13/2023)   No facility-administered encounter medications on file as of 04/13/2023.    Allergies (verified) Sulfa antibiotics   History: Past Medical History:  Diagnosis Date   Allergy    Anginal pain (HCC)    Anxiety    Chronic kidney disease    kidney stones   Colon polyp 2008   Colonoscopy due to repeat in 2011 due to polyps   Coronary artery disease     Essential hypertension, benign 06/05/2012   GERD (gastroesophageal reflux disease)    History of herpes zoster    Hypercholesterolemia    Hyperlipidemia 06/05/2012   Hypertension    Left main coronary artery disease 06/05/2012   Myocardial infarction (HCC)    Obesity (BMI 30-39.9) 06/05/2012   Obstructive sleep apnea 06/05/2012   Sleep apnea    Smoker    Tobacco abuse 06/05/2012   Vitamin D deficiency    Past Surgical History:  Procedure Laterality Date   BASAL CELL CARCINOMA EXCISION  1997   CARDIAC CATHETERIZATION     COLONOSCOPY N/A 01/11/2020   Procedure: COLONOSCOPY;  Surgeon: Regis Bill, MD;  Location: [facility] ENDOSCOPY;  Service: Endoscopy;  Laterality: N/A;   COLONOSCOPY WITH PROPOFOL N/A 06/10/2015   Procedure: COLONOSCOPY WITH PROPOFOL;  Surgeon: Christena Deem, MD;  Location: [facility] ENDOSCOPY;  Service: Endoscopy;  Laterality: N/A;   CORONARY ARTERY BYPASS GRAFT N/A 06/06/2012   Procedure: CORONARY ARTERY BYPASS GRAFTING (CABG);  Surgeon: Delight Ovens, MD;  Location: [facility] OR;  Service: Open Heart Surgery;  Laterality: N/A;  x4 using right greater saphenous vein and left internal mammary.    CORONARY ARTERY BYPASS GRAFT  06/06/2012   ENDOVEIN HARVEST OF GREATER SAPHENOUS VEIN Right 06/06/2012   Procedure: ENDOVEIN HARVEST OF GREATER SAPHENOUS VEIN;  Surgeon: Delight Ovens, MD;  Location: [facility] OR;  Service: Open Heart Surgery;  Laterality: Right;   ESOPHAGOGASTRODUODENOSCOPY (EGD) WITH PROPOFOL N/A 06/10/2015   Procedure: ESOPHAGOGASTRODUODENOSCOPY (EGD) WITH PROPOFOL;  Surgeon: Christena Deem, MD;  Location: [facility] ENDOSCOPY;  Service: Endoscopy;  Laterality: N/A;   INTRAOPERATIVE TRANSESOPHAGEAL ECHOCARDIOGRAM N/A 06/06/2012   Procedure: INTRAOPERATIVE TRANSESOPHAGEAL ECHOCARDIOGRAM;  Surgeon: Delight Ovens, MD;  Location: [facility] OR;  Service: Open Heart Surgery;  Laterality: N/A;   LITHOTRIPSY  01/11/2008   TONSILLECTOMY     Family History  Problem Relation  Age of Onset   Hypertension Other    Social History   Socioeconomic History   Marital status: Married    Spouse name: Not on file   Number of children: Not on file   Years of education: Not on file   Highest education level: Some college, no degree  Occupational History   Occupation: retired  Tobacco Use   Smoking status: Former    Current packs/day: 0.00    Average packs/day: 1.5 packs/day for 37.0 years (55.5 ttl pk-yrs)    Types: Cigarettes    Start date: 06/06/1975    Quit date: 06/05/2012    Years since quitting: 10.8   Smokeless tobacco: Never  Vaping Use   Vaping status: Never Used  Substance and Sexual Activity   Alcohol use: No   Drug use: No   Sexual activity: Yes    Birth control/protection: None  Other Topics Concern   Not on file  Social History Narrative   Not on file   Social Drivers of Health  Financial Resource Strain: Low Risk  (04/13/2023)   Overall Financial Resource Strain (CARDIA)    Difficulty of Paying Living Expenses: Not hard at all  Food Insecurity: No Food Insecurity (04/13/2023)   Hunger Vital Sign    Worried About Running Out of Food in the Last Year: Never true    Ran Out of Food in the Last Year: Never true  Transportation Needs: No Transportation Needs (04/13/2023)   PRAPARE - Administrator, Civil Service (Medical): No    Lack of Transportation (Non-Medical): No  Physical Activity: Sufficiently Active (04/13/2023)   Exercise Vital Sign    Days of Exercise per Week: 5 days    Minutes of Exercise per Session: 30 min  Stress: No Stress Concern Present (04/13/2023)   [employer] of Occupational Health - Occupational Stress Questionnaire    Feeling of Stress : Not at all  Social Connections: Socially Integrated (04/13/2023)   Social Connection and Isolation Panel [NHANES]    Frequency of Communication with Friends and Family: More than three times a week    Frequency of Social Gatherings with Friends and Family: More than three  times a week    Attends Religious Services: More than 4 times per year    Active Member of [employer] or Organizations: Yes    Attends Engineer, structural: More than 4 times per year    Marital Status: Married    Tobacco Counseling Counseling given: Not Answered    Clinical Intake:  Pre-visit preparation completed: Yes  Pain : No/denies pain     Nutritional Status: BMI > 30  Obese Nutritional Risks: None Diabetes: No  Lab Results  Component Value Date   HGBA1C 5.9 (H) 12/18/2021   HGBA1C 5.9 (H) 01/16/2021   HGBA1C 5.5 06/06/2012     How often do you need to have someone help you when you read instructions, pamphlets, or other written materials from your doctor or pharmacy?: 1 - Never  Interpreter Needed?: No  Information entered by :: Kennedy Bucker, LPN   Activities of Daily Living    04/13/2023    9:54 AM 04/15/2022    8:46 PM  In your present state of health, do you have any difficulty performing the following activities:  Hearing? 0 0  Vision? 0 0  Difficulty concentrating or making decisions? 0 0  Walking or climbing stairs? 0 0  Dressing or bathing? 0 0  Doing errands, shopping? 0 0  Preparing Food and eating ? N N  Using the Toilet? N N  In the past six months, have you accidently leaked urine? N Y  Do you have problems with loss of bowel control? N N  Managing your Medications? N N  Managing your Finances? N N  Housekeeping or managing your Housekeeping? N N    Patient Care Team: Cherlynn Polo as PCP - General (Physician Assistant) Lady Gary Darlin Priestly, MD as Attending Physician (Cardiology) Irene Limbo., MD (Ophthalmology)  Indicate any recent Medical Services you may have received from other than [facility] providers in the past year (date may be approximate).     Assessment:   This is a routine wellness examination for Joon.  Hearing/Vision screen Hearing Screening - Comments:: NO AIDS Vision Screening - Comments:: WEARS GLASSES  ALL THE TIME- [email or web address]   Goals Addressed             This Visit's Progress    DIET - INCREASE WATER INTAKE  Depression Screen     04/13/2023    9:51 AM 12/20/2022    9:49 AM 12/18/2021    8:54 AM 04/14/2021    9:05 AM 01/16/2021   10:00 AM 05/17/2019    9:07 AM 02/21/2018    9:07 AM  PHQ 2/9 Scores  PHQ - 2 Score 0 0 0 0 0 0 0  PHQ- 9 Score 0 0 0  0 0 0    Fall Risk     04/13/2023    9:54 AM 12/20/2022    9:49 AM 04/15/2022    8:46 PM 12/18/2021    8:54 AM 04/14/2021    9:08 AM  Fall Risk   Falls in the past year? 0 0 0 0 0  Number falls in past yr: 0 0 0 0 0  Injury with Fall? 0 0 0 0 0  Risk for fall due to : No Fall Risks   No Fall Risks No Fall Risks  Follow up Falls prevention discussed;Falls evaluation completed   Falls evaluation completed Falls evaluation completed    MEDICARE RISK AT HOME:  Medicare Risk at Home Any stairs in or around the home?: Yes If so, are there any without handrails?: No Home free of loose throw rugs in walkways, pet beds, electrical cords, etc?: Yes Adequate lighting in your home to reduce risk of falls?: Yes Life alert?: No Use of a cane, walker or w/c?: No Grab bars in the bathroom?: Yes Shower chair or bench in shower?: Yes Elevated toilet seat or a handicapped toilet?: Yes  TIMED UP AND GO:  Was the test performed?  No  Cognitive Function: 6CIT completed        04/13/2023    9:55 AM 04/19/2022    8:30 AM  6CIT Screen  What Year? 0 points 0 points  What month? 0 points 0 points  What time? 0 points 0 points  Count back from 20 0 points 0 points  Months in reverse 0 points 0 points  Repeat phrase 0 points 0 points  Total Score 0 points 0 points    Immunizations Immunization History  Administered Date(s) Administered   Tdap 12/21/2011    Screening Tests Health Maintenance  Topic Date Due   Pneumonia Vaccine 72+ Years old (1 of 2 - PCV) Never done   Lung Cancer Screening  Never done   Zoster Vaccines-  Shingrix (1 of 2) Never done   DTaP/Tdap/Td (2 - Td or Tdap) 12/20/2021   COVID-19 Vaccine (1 - 2024-25 season) Never done   INFLUENZA VACCINE  08/05/2023   Medicare Annual Wellness (AWV)  04/12/2024   Colonoscopy  01/10/2030   Hepatitis C Screening  Completed   HPV VACCINES  Aged Out    Health Maintenance  Health Maintenance Due  Topic Date Due   Pneumonia Vaccine 89+ Years old (1 of 2 - PCV) Never done   Lung Cancer Screening  Never done   Zoster Vaccines- Shingrix (1 of 2) Never done   DTaP/Tdap/Td (2 - Td or Tdap) 12/20/2021   COVID-19 Vaccine (1 - 2024-25 season) Never done   Health Maintenance Items Addressed: DECLINES ALL SHOTS; COLONOSCOPY UP TO DATE  Additional Screening:  Vision Screening: Recommended annual ophthalmology exams for early detection of glaucoma and other disorders of the eye.  Dental Screening: Recommended annual dental exams for proper oral hygiene  Community Resource Referral / Chronic Care Management: CRR required this visit?  No   CCM required this visit?  No  Plan:     I have personally reviewed and noted the following in the patient's chart:   Medical and social history Use of alcohol, tobacco or illicit drugs  Current medications and supplements including opioid prescriptions. Patient is not currently taking opioid prescriptions. Functional ability and status Nutritional status Physical activity Advanced directives List of other physicians Hospitalizations, surgeries, and ER visits in previous 12 months Vitals Screenings to include cognitive, depression, and falls Referrals and appointments  In addition, I have reviewed and discussed with patient certain preventive protocols, quality metrics, and best practice recommendations. A written personalized care plan for preventive services as well as general preventive health recommendations were provided to patient.     Hal Hope, LPN   07/12/2954   After Visit Summary:  (MyChart) Due to this being a telephonic visit, the after visit summary with patients personalized plan was offered to patient via MyChart   Notes: Nothing significant to report at this time.

## 2023-04-13 NOTE — Patient Instructions (Addendum)
 [name] , Thank you for taking time to come for your Medicare Wellness Visit. I appreciate your ongoing commitment to your health goals. Please review the following plan we discussed and let me know if I can assist you in the future.   Referrals/Orders/Follow-Ups/Clinician Recommendations: NONE  This is a list of the screening recommended for you and due dates:  Health Maintenance  Topic Date Due   Pneumonia Vaccine (1 of 2 - PCV) Never done   Screening for Lung Cancer  Never done   Zoster (Shingles) Vaccine (1 of 2) Never done   DTaP/Tdap/Td vaccine (2 - Td or Tdap) 12/20/2021   COVID-19 Vaccine (1 - 2024-25 season) Never done   Flu Shot  08/05/2023   Medicare Annual Wellness Visit  04/12/2024   Colon Cancer Screening  01/10/2030   Hepatitis C Screening  Completed   HPV Vaccine  Aged Out    Advanced directives: (ACP Link)Information on Advanced Care Planning can be found at [facility] Advance Health Care Directives Advance Health Care Directives. [email or web address]   Next Medicare Annual Wellness Visit scheduled for next year: Yes  04/17/24 @ 3:50 PM BY PHONE

## 2023-04-21 ENCOUNTER — Other Ambulatory Visit: Payer: Self-pay | Admitting: Physician Assistant

## 2023-04-21 DIAGNOSIS — N401 Enlarged prostate with lower urinary tract symptoms: Secondary | ICD-10-CM

## 2023-05-25 ENCOUNTER — Other Ambulatory Visit: Payer: Self-pay | Admitting: Cardiovascular Disease

## 2023-06-01 NOTE — Progress Notes (Unsigned)
 Cardiology Office Note    Date:  06/02/2023   ID:  [name], DOB 22-Jan-1955, MRN [id number]  PCP:  Blane Bunting, PA-C  Cardiologist:  None  Electrophysiologist:  None   Chief Complaint: Follow up  History of Present Illness:   [name] is a 68 y.o. male with history of hypertension, hyperlipidemia, and CAD s/p CABG x 4 in 2014 who presents for follow up on CAD, hypertension, and hyperlipidemia.    Patient was previously followed at [facility] clinic with Dr. Cara Chancellor.  He underwent CABG x 4 including LIMA to LAD, SVG to first intermediate, SVG to second intermediate, and SVG to distal RCA at [facility] in 2014.  Echo 07/2014 showed EF greater than 55% with mild LVH and mild MR.  Exercise Myoview 04/2015 showed normal LV function with no evidence of ischemia.  Echo 09/2017 showed EF greater than 55% with mild TR and mild MR.  Exercise Myoview 08/2020 showed normal LV function with no evidence of reversible ischemia, low risk study.  Patient established with Dr. Gollan 09/2021.   Patient was most recently seen by Dr. Gollan 03/2022 and was overall doing well from a cardiac perspective.  He reported rare chest pain if he missed medication.  He was continued on aspirin , ezetimibe , losartan , metoprolol , and rosuvastatin .  Patient reports doing well from a cardiac perspective without symptoms of angina or cardiac decompensation.  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  He reports that if he falls asleep before taking his medications, occasionally he has some chest discomfort in the morning.  This has been ongoing for several years.  No symptoms when he does take his medications.  He denies chest pain, shortness of breath, palpitations, lightheadedness, dizziness, lower extremity swelling, orthopnea, and PND.  He denies bleeding and hematochezia.  Labs independently reviewed: 12/20/2022-TC 103, TG 93, HDL 39, LDL 46, BUN 21, CR 1.1, NA  142, K4.2, normal LFTs, Hgb 17.7, HCT 53.3  Past Medical History:  Diagnosis Date   Allergy    Anginal pain (HCC)    Anxiety    CAD (coronary artery disease)    a. 06/2012 Cath: LM 99, RCA 100 CTO; b. 06/2012 CABG x 4 (LIMA->LAD, VG->RI1, VG->RI2, VG->RCA); c. 04/2015 MV: No isch/infarct, EF 57%; d. 08/2020 MV: No isch/infarct, EF 57%.   Chronic kidney disease    kidney stones   Colon polyp 2008   Colonoscopy due to repeat in 2011 due to polyps   Essential hypertension, benign 06/05/2012   GERD (gastroesophageal reflux disease)    History of echocardiogram    a. 07/2014 Echo: EF>55%, mild LVH; b. 09/2017 Echo: EF>55%, nl RV fxn, mild TR.   History of herpes zoster    Hypercholesterolemia    Hyperlipidemia 06/05/2012   Hypertension    Left main coronary artery disease 06/05/2012   Myocardial infarction [facility])    Obesity (BMI 30-39.9) 06/05/2012   Obstructive sleep apnea 06/05/2012   Sleep apnea    Smoker    Tobacco abuse 06/05/2012   Vitamin D  deficiency     Current Medications: Current Meds  Medication Sig   aspirin  EC (ASPIRIN  81) 81 MG tablet Take 81 mg by mouth daily. Swallow whole.   azelastine  (ASTELIN ) 0.1 % nasal spray Place 2 sprays into both nostrils 2 (two) times daily. Use in each nostril as directed   benzonatate  (TESSALON ) 100 MG capsule Take 1 capsule (100 mg total) by  mouth 3 (three) times daily as needed for cough.   cetirizine (ZYRTEC) 10 MG tablet Take 10 mg by mouth every evening.   Cholecalciferol  (VITAMIN D3) 1.25 MG (50000 UT) CAPS Vitamin D3   doxycycline  (VIBRA -TABS) 100 MG tablet Take 1 tablet (100 mg total) by mouth 2 (two) times daily.   ezetimibe  (ZETIA ) 10 MG tablet TAKE 1 TABLET BY MOUTH DAILY.   finasteride  (PROSCAR ) 5 MG tablet TAKE 1 TABLET BY MOUTH DAILY.   fluticasone (FLONASE) 50 MCG/ACT nasal spray fluticasone propionate 50 mcg/actuation nasal spray,suspension  SPRAY TWICE IN EACH NOSTRIL ONCE DAILY   losartan -hydrochlorothiazide (HYZAAR)  100-12.5 MG tablet TAKE 1 TABLET BY MOUTH DAILY.   metoprolol  succinate (TOPROL -XL) 50 MG 24 hr tablet TAKE 1 TABLET BY MOUTH DAILY. TAKE WITH OR IMMEDIATELY FOLLOWING A MEAL.   pantoprazole  (PROTONIX ) 20 MG tablet Take 20 mg by mouth daily.   rosuvastatin  (CRESTOR ) 20 MG tablet Take 1 tablet (20 mg total) by mouth daily. Please call office to schedule an appt for further refills. Thank you   tamsulosin  (FLOMAX ) 0.4 MG CAPS capsule Take 1 capsule (0.4 mg total) by mouth daily.    Allergies:   Sulfa antibiotics   Social History   Socioeconomic History   Marital status: Married    Spouse name: Not on file   Number of children: Not on file   Years of education: Not on file   Highest education level: Some college, no degree  Occupational History   Occupation: retired  Tobacco Use   Smoking status: Former    Current packs/day: 0.00    Average packs/day: 1.5 packs/day for 37.0 years (55.5 ttl pk-yrs)    Types: Cigarettes    Start date: 06/06/1975    Quit date: 06/05/2012    Years since quitting: 10.9   Smokeless tobacco: Never  Vaping Use   Vaping status: Never Used  Substance and Sexual Activity   Alcohol use: No   Drug use: No   Sexual activity: Yes    Birth control/protection: None  Other Topics Concern   Not on file  Social History Narrative   Not on file   Social Drivers of Health   Financial Resource Strain: Low Risk  (04/13/2023)   Overall Financial Resource Strain (CARDIA)    Difficulty of Paying Living Expenses: Not hard at all  Food Insecurity: No Food Insecurity (04/13/2023)   Hunger Vital Sign    Worried About Running Out of Food in the Last Year: Never true    Ran Out of Food in the Last Year: Never true  Transportation Needs: No Transportation Needs (04/13/2023)   PRAPARE - Administrator, Civil Service (Medical): No    Lack of Transportation (Non-Medical): No  Physical Activity: Sufficiently Active (04/13/2023)   Exercise Vital Sign    Days of Exercise  per Week: 5 days    Minutes of Exercise per Session: 30 min  Stress: No Stress Concern Present (04/13/2023)   [employer] of Occupational Health - Occupational Stress Questionnaire    Feeling of Stress : Not at all  Social Connections: Socially Integrated (04/13/2023)   Social Connection and Isolation Panel [NHANES]    Frequency of Communication with Friends and Family: More than three times a week    Frequency of Social Gatherings with Friends and Family: More than three times a week    Attends Religious Services: More than 4 times per year    Active Member of [employer] or Organizations: Yes  Attends Engineer, structural: More than 4 times per year    Marital Status: Married     Family History:  The patient's family history includes Hypertension in an other family member.  ROS:   12-point review of systems is negative unless otherwise noted in the HPI.   EKGs/Labs/Other Studies Reviewed:    Studies reviewed were summarized above. The additional studies were reviewed today:  08/15/2020 Exercise NM myocardial perfusion SPECT ([facility]) Negative ETT with good exercise tolerance.  LV function normal.  No evidence of reversible ischemia.  Low risk study   09/06/2017 Echo complete ([facility]) NORMAL LEFT VENTRICULAR SYSTOLIC FUNCTION  NORMAL RIGHT VENTRICULAR SYSTOLIC FUNCTION  MILD VALVULAR REGURGITATION (See above)  NO VALVULAR STENOSIS  Closest EF: >55% (Estimated)  Tricuspid: MILD TR  Mitral: MILD MR   EKG:  EKG is ordered today.  The EKG ordered today demonstrates sinus rhythm with occasional PVC, old inferior infarct  Recent Labs: 12/20/2022: ALT 33; BUN 21; Creatinine, Ser 1.10; Hemoglobin 17.7; Platelets 231; Potassium 4.2; Sodium 142; TSH 1.260  Recent Lipid Panel    Component Value Date/Time   CHOL 103 12/20/2022 1034   TRIG 93 12/20/2022 1034   HDL 39 (L) 12/20/2022 1034   CHOLHDL 2.6 12/20/2022 1034   CHOLHDL 3.4 06/06/2012 0436   VLDL 22 06/06/2012 0436    LDLCALC 46 12/20/2022 1034    PHYSICAL EXAM:    VS:  BP 124/62 (BP Location: Left Arm, Patient Position: Sitting, Cuff Size: Normal)   Pulse 62   Ht 5\' 7"  (1.702 m)   Wt 230 lb 6.4 oz (104.5 kg)   SpO2 96%   BMI 36.09 kg/m   BMI: Body mass index is 36.09 kg/m.  Physical Exam Vitals and nursing note reviewed.  Constitutional:      Appearance: Normal appearance.  Cardiovascular:     Rate and Rhythm: Normal rate and regular rhythm.     Heart sounds: No murmur heard.    No gallop.  Pulmonary:     Effort: Pulmonary effort is normal.     Breath sounds: Normal breath sounds. No wheezing or rales.  Musculoskeletal:     Right lower leg: No edema.     Left lower leg: No edema.  Skin:    General: Skin is warm and dry.  Neurological:     General: No focal deficit present.     Mental Status: He is alert and oriented to person, place, and time. Mental status is at baseline.  Psychiatric:        Mood and Affect: Mood normal.        Behavior: Behavior normal.     Wt Readings from Last 3 Encounters:  06/02/23 230 lb 6.4 oz (104.5 kg)  12/20/22 230 lb (104.3 kg)  10/19/22 229 lb (103.9 kg)     ASSESSMENT & PLAN:   Coronary artery disease s/p CABG x4 2014 - No symptoms of angina or cardiac decompensation.  No further ischemic evaluation indicated at this time.  Continue aspirin  81 mg daily, ezetimibe  10 mg daily, metoprolol  50 mg daily, and rosuvastatin  20 mg daily.  Hypertension - Blood pressure well-controlled.  Continue losartan  100 mg daily and metoprolol  50 mg daily.  Hyperlipidemia - Most recent lipid panel 12/2022 with LDL 46, at goal.  Continue ezetimibe  and rosuvastatin  as above.    Disposition: F/u with Dr. Gollan or an APP in 12 months.   Medication Adjustments/Labs and Tests Ordered: Current medicines are reviewed at length with the  patient today.  Concerns regarding medicines are outlined above. Medication changes, Labs and Tests ordered today are summarized  above and listed in the Patient Instructions accessible in Encounters.   Beather Liming, PA-C 06/02/2023 9:03 AM     [facility] [place] [address] [place], [place] [address] [phone]

## 2023-06-02 ENCOUNTER — Encounter: Payer: Self-pay | Admitting: Nurse Practitioner

## 2023-06-02 ENCOUNTER — Ambulatory Visit: Attending: Nurse Practitioner | Admitting: Physician Assistant

## 2023-06-02 VITALS — BP 124/62 | HR 62 | Ht 67.0 in | Wt 230.4 lb

## 2023-06-02 DIAGNOSIS — I10 Essential (primary) hypertension: Secondary | ICD-10-CM | POA: Diagnosis not present

## 2023-06-02 DIAGNOSIS — E785 Hyperlipidemia, unspecified: Secondary | ICD-10-CM

## 2023-06-02 DIAGNOSIS — I25708 Atherosclerosis of coronary artery bypass graft(s), unspecified, with other forms of angina pectoris: Secondary | ICD-10-CM

## 2023-06-02 MED ORDER — ROSUVASTATIN CALCIUM 20 MG PO TABS: 20.0000 mg | ORAL_TABLET | Freq: Every day | ORAL | 3 refills | Status: AC

## 2023-06-02 MED ORDER — METOPROLOL SUCCINATE ER 50 MG PO TB24: 50.0000 mg | ORAL_TABLET | Freq: Every day | ORAL | 3 refills | Status: AC

## 2023-06-02 MED ORDER — LOSARTAN POTASSIUM-HCTZ 100-12.5 MG PO TABS: 1.0000 | ORAL_TABLET | Freq: Every day | ORAL | 3 refills | Status: AC

## 2023-06-02 NOTE — Patient Instructions (Signed)
 Medication Instructions:  No changes at this time. Refills have been sent to your pharmacy.   *If you need a refill on your cardiac medications before your next appointment, please call your pharmacy*  Lab Work: None  If you have labs (blood work) drawn today and your tests are completely normal, you will receive your results only by: MyChart Message (if you have MyChart) OR A paper copy in the mail If you have any lab test that is abnormal or we need to change your treatment, we will call you to review the results.  Testing/Procedures: None  Follow-Up: At [facility], you and your health needs are our priority.  As part of our continuing mission to provide you with exceptional heart care, our providers are all part of one team.  This team includes your primary Cardiologist (physician) and Advanced Practice Providers or APPs (Physician Assistants and Nurse Practitioners) who all work together to provide you with the care you need, when you need it.  Your next appointment:   1 year(s)  Provider:   You may see Dr. Timothy Gollan or one of the following Advanced Practice Providers on your designated Care Team:   Laneta Pintos, NP Gildardo Labrador, PA-C Varney Gentleman, PA-C Cadence [employer], PA-C Ronald Cockayne, NP Morey Ar, NP

## 2023-09-29 ENCOUNTER — Other Ambulatory Visit: Payer: Self-pay | Admitting: Physician Assistant

## 2023-09-29 DIAGNOSIS — N401 Enlarged prostate with lower urinary tract symptoms: Secondary | ICD-10-CM

## 2023-10-26 ENCOUNTER — Other Ambulatory Visit: Payer: Self-pay | Admitting: Physician Assistant

## 2023-10-26 DIAGNOSIS — N401 Enlarged prostate with lower urinary tract symptoms: Secondary | ICD-10-CM

## 2023-12-22 ENCOUNTER — Ambulatory Visit: Payer: Self-pay | Admitting: Physician Assistant

## 2023-12-22 ENCOUNTER — Encounter: Payer: Self-pay | Admitting: Physician Assistant

## 2023-12-22 VITALS — BP 126/74 | HR 57 | Resp 14 | Ht 67.0 in | Wt 229.1 lb

## 2023-12-22 DIAGNOSIS — J3089 Other allergic rhinitis: Secondary | ICD-10-CM

## 2023-12-22 DIAGNOSIS — K5909 Other constipation: Secondary | ICD-10-CM

## 2023-12-22 DIAGNOSIS — E782 Mixed hyperlipidemia: Secondary | ICD-10-CM | POA: Diagnosis not present

## 2023-12-22 DIAGNOSIS — I10 Essential (primary) hypertension: Secondary | ICD-10-CM

## 2023-12-22 DIAGNOSIS — I25810 Atherosclerosis of coronary artery bypass graft(s) without angina pectoris: Secondary | ICD-10-CM | POA: Diagnosis not present

## 2023-12-22 DIAGNOSIS — Z0001 Encounter for general adult medical examination with abnormal findings: Secondary | ICD-10-CM | POA: Diagnosis not present

## 2023-12-22 DIAGNOSIS — E66811 Obesity, class 1: Secondary | ICD-10-CM | POA: Diagnosis not present

## 2023-12-22 DIAGNOSIS — Z0000 Encounter for general adult medical examination without abnormal findings: Secondary | ICD-10-CM | POA: Insufficient documentation

## 2023-12-22 NOTE — Progress Notes (Signed)
 Complete physical exam  Patient: [name]   DOB: [birth year]   68 y.o. Male  MRN: [id number] Visit Date: 12/22/2023  Today's healthcare provider: Jolynn Spencer, PA-C   Chief Complaint  Patient presents with   Annual Exam    Last Exam Physical: 12/20/2022 Diet: No specific regimen Exercise: Works part-time, does a lot of standing, lifting heavy equipment Feeling: Good Sleeping: 6-7 hours of sleep No concerns   Subjective    [name] is a 68 y.o. male who presents today for a complete physical exam.   HPI     Annual Exam    Additional comments: Last Exam Physical: 12/20/2022 Diet: No specific regimen Exercise: Works part-time, does a lot of standing, lifting heavy equipment Feeling: Good Sleeping: 6-7 hours of sleep No concerns      Last edited by Wilfred Hargis RAMAN, CMA on 12/22/2023 10:01 AM.     Discussed the use of AI scribe software for clinical note transcription with the patient, who gave verbal consent to proceed.  History of Present Illness [name] is a 68 year old male who presents for an annual physical exam.  He takes Zetia  and Crestor  for cholesterol, Hyzaar and metoprolol  for blood pressure and heart rate, aspirin  81 mg daily, and Zyrtec daily for allergies.  He has dry eye syndrome and allergy-related congestion controlled with Zyrtec.  He has mild baseline constipation. He recently noticed darker, almost black stools but no visible blood in stool or urine. He had a normal colonoscopy within the last 1 to 2 years.  He has chronic swelling on one side for about 10 years that has been evaluated by his cardiologist with ECGs and no reported concerns.  He is sleeping well, feels well overall, works part-time, and eats a regular diet.    Last depression screening scores    12/22/2023   10:03 AM 04/13/2023    9:51 AM 12/20/2022    9:49 AM  PHQ 2/9 Scores  PHQ - 2 Score 0 0 0  PHQ- 9 Score  0  0      Data saved with a previous  flowsheet row definition   Last fall risk screening    12/22/2023   10:02 AM  Fall Risk   Falls in the past year? 0  Number falls in past yr: 0  Injury with Fall? 0  Risk for fall due to : No Fall Risks  Follow up Falls evaluation completed   Last Audit-C alcohol use screening    12/21/2023    2:26 PM  Alcohol Use Disorder Test (AUDIT)  1. How often do you have a drink containing alcohol? 0   3. How often do you have six or more drinks on one occasion? 0      Manually entered by patient   A score of 3 or more in women, and 4 or more in men indicates increased risk for alcohol abuse, EXCEPT if all of the points are from question 1   Past Medical History:  Diagnosis Date   Allergy    Anginal pain    Anxiety    CAD (coronary artery disease)    a. 06/2012 Cath: LM 99, RCA 100 CTO; b. 06/2012 CABG x 4 (LIMA->LAD, VG->RI1, VG->RI2, VG->RCA); c. 04/2015 MV: No isch/infarct, EF 57%; d. 08/2020 MV: No isch/infarct, EF 57%.   Chronic kidney disease    kidney stones   Colon polyp 2008   Colonoscopy due to repeat in 2011 due to  polyps   Essential hypertension, benign 06/05/2012   GERD (gastroesophageal reflux disease)    History of echocardiogram    a. 07/2014 Echo: EF>55%, mild LVH; b. 09/2017 Echo: EF>55%, nl RV fxn, mild TR.   History of herpes zoster    Hypercholesterolemia    Hyperlipidemia 06/05/2012   Hypertension    Left main coronary artery disease 06/05/2012   Myocardial infarction (HCC)    Obesity (BMI 30-39.9) 06/05/2012   Obstructive sleep apnea 06/05/2012   Sleep apnea    Smoker    Tobacco abuse 06/05/2012   Vitamin D  deficiency    Past Surgical History:  Procedure Laterality Date   BASAL CELL CARCINOMA EXCISION  1997   CARDIAC CATHETERIZATION     COLONOSCOPY N/A 01/11/2020   Procedure: COLONOSCOPY;  Surgeon: Maryruth Ole DASEN, MD;  Location: [facility] ENDOSCOPY;  Service: Endoscopy;  Laterality: N/A;   COLONOSCOPY WITH PROPOFOL  N/A 06/10/2015   Procedure:  COLONOSCOPY WITH PROPOFOL ;  Surgeon: Gladis RAYMOND Mariner, MD;  Location: [facility] ENDOSCOPY;  Service: Endoscopy;  Laterality: N/A;   CORONARY ARTERY BYPASS GRAFT N/A 06/06/2012   Procedure: CORONARY ARTERY BYPASS GRAFTING (CABG);  Surgeon: Dallas KATHEE Jude, MD;  [facility];  Service: Open Heart Surgery;  Laterality: N/A;  x4 using right greater saphenous vein and left internal mammary.    CORONARY ARTERY BYPASS GRAFT  06/06/2012   ENDOVEIN HARVEST OF GREATER SAPHENOUS VEIN Right 06/06/2012   Procedure: ENDOVEIN HARVEST OF GREATER SAPHENOUS VEIN;  Surgeon: Dallas KATHEE Jude, MD;  Location: [facility] OR;  Service: Open Heart Surgery;  Laterality: Right;   ESOPHAGOGASTRODUODENOSCOPY (EGD) WITH PROPOFOL  N/A 06/10/2015   Procedure: ESOPHAGOGASTRODUODENOSCOPY (EGD) WITH PROPOFOL ;  Surgeon: Gladis RAYMOND Mariner, MD;  Location: [facility] ENDOSCOPY;  Service: Endoscopy;  Laterality: N/A;   INTRAOPERATIVE TRANSESOPHAGEAL ECHOCARDIOGRAM N/A 06/06/2012   Procedure: INTRAOPERATIVE TRANSESOPHAGEAL ECHOCARDIOGRAM;  Surgeon: Dallas KATHEE Jude, MD;  Location: [facility] OR;  Service: Open Heart Surgery;  Laterality: N/A;   LITHOTRIPSY  01/11/2008   TONSILLECTOMY     Social History   Socioeconomic History   Marital status: Married    Spouse name: Not on file   Number of children: Not on file   Years of education: Not on file   Highest education level: 12th grade  Occupational History   Occupation: retired  Tobacco Use   Smoking status: Former    Current packs/day: 0.00    Average packs/day: 1.5 packs/day for 37.0 years (55.5 ttl pk-yrs)    Types: Cigarettes    Start date: 06/06/1975    Quit date: 06/05/2012    Years since quitting: 11.5   Smokeless tobacco: Never  Vaping Use   Vaping status: Never Used  Substance and Sexual Activity   Alcohol use: Never   Drug use: Never   Sexual activity: Yes    Birth control/protection: None  Other Topics Concern   Not on file  Social History Narrative   Not on file   Social Drivers  of Health   Tobacco Use: Medium Risk (12/22/2023)   Patient History    Smoking Tobacco Use: Former    Smokeless Tobacco Use: Never    Passive Exposure: Not on Actuary Strain: Low Risk (12/21/2023)   Overall Financial Resource Strain (CARDIA)    Difficulty of Paying Living Expenses: Not hard at all  Food Insecurity: No Food Insecurity (12/21/2023)   Epic    Worried About Radiation Protection Practitioner of Food in the Last Year: Never true    Ran Out of  Food in the Last Year: Never true  Transportation Needs: No Transportation Needs (12/21/2023)   Epic    Lack of Transportation (Medical): No    Lack of Transportation (Non-Medical): No  Physical Activity: Sufficiently Active (04/13/2023)   Exercise Vital Sign    Days of Exercise per Week: 5 days    Minutes of Exercise per Session: 30 min  Stress: No Stress Concern Present (04/13/2023)   [employer] of Occupational Health - Occupational Stress Questionnaire    Feeling of Stress : Not at all  Social Connections: Socially Integrated (12/21/2023)   Social Connection and Isolation Panel    Frequency of Communication with Friends and Family: More than three times a week    Frequency of Social Gatherings with Friends and Family: More than three times a week    Attends Religious Services: More than 4 times per year    Active Member of Clubs or Organizations: Yes    Attends Banker Meetings: More than 4 times per year    Marital Status: Married  Catering Manager Violence: Not At Risk (04/13/2023)   Humiliation, Afraid, Rape, and Kick questionnaire    Fear of Current or Ex-Partner: No    Emotionally Abused: No    Physically Abused: No    Sexually Abused: No  Depression (PHQ2-9): Low Risk (12/22/2023)   Depression (PHQ2-9)    PHQ-2 Score: 0  Alcohol Screen: Low Risk (04/13/2023)   Alcohol Screen    Last Alcohol Screening Score (AUDIT): 0  Housing: Low Risk (12/22/2023)   Epic    Unable to Pay for Housing in the Last Year: No     Number of Times Moved in the Last Year: 0    Homeless in the Last Year: No  Utilities: Not At Risk (04/13/2023)   [employer] Utilities    Threatened with loss of utilities: No  Health Literacy: Adequate Health Literacy (04/13/2023)   B1300 Health Literacy    Frequency of need for help with medical instructions: Never   Family Status  Relation Name Status   Mother  Alive   Father MI Deceased   Brother  Alive   Other  (Not Specified)  No partnership data on file   Family History  Problem Relation Age of Onset   Hypertension Other    Allergies[1]  Patient Care Team: Sachit Gilman, PA-C as PCP - General (Physician Assistant) Bosie Vinie LABOR, MD as Attending Physician (Cardiology) Carolee Manus DASEN., MD (Ophthalmology)   Medications: Show/hide medication list[2]  Review of Systems  All other systems reviewed and are negative.  Except see HPI     Objective    BP 126/74   Pulse (!) 57   Resp 14   Ht 5' 7 (1.702 m)   Wt 229 lb 1.6 oz (103.9 kg)   SpO2 97%   BMI 35.88 kg/m      Physical Exam Vitals reviewed.  Constitutional:      General: He is not in acute distress.    Appearance: Normal appearance. He is well-developed. He is not ill-appearing, toxic-appearing or diaphoretic.  HENT:     Head: Normocephalic and atraumatic.     Right Ear: Tympanic membrane, ear canal and external ear normal.     Left Ear: Tympanic membrane, ear canal and external ear normal.     Nose: Nose normal. No congestion or rhinorrhea.     Mouth/Throat:     Mouth: Mucous membranes are moist.     Pharynx: Oropharynx is clear. No  oropharyngeal exudate.  Eyes:     General: No scleral icterus.       Right eye: No discharge.        Left eye: No discharge.     Conjunctiva/sclera: Conjunctivae normal.     Pupils: Pupils are equal, round, and reactive to light.  Neck:     Thyroid: No thyromegaly.     Vascular: No carotid bruit.  Cardiovascular:     Rate and Rhythm: Normal rate and regular  rhythm.     Pulses: Normal pulses.     Heart sounds: Normal heart sounds. No murmur heard.    No friction rub. No gallop.  Pulmonary:     Effort: Pulmonary effort is normal. No respiratory distress.     Breath sounds: Normal breath sounds. No wheezing or rales.  Abdominal:     General: Abdomen is flat. Bowel sounds are normal. There is no distension.     Palpations: Abdomen is soft. There is no mass.     Tenderness: There is no abdominal tenderness. There is no right CVA tenderness, left CVA tenderness, guarding or rebound.     Hernia: No hernia is present.  Musculoskeletal:        General: No swelling, tenderness, deformity or signs of injury. Normal range of motion.     Cervical back: Normal range of motion and neck supple. No rigidity or tenderness.     Right lower leg: No edema.     Left lower leg: No edema.  Lymphadenopathy:     Cervical: No cervical adenopathy.  Skin:    General: Skin is warm and dry.     Coloration: Skin is not jaundiced or pale.     Findings: No bruising, erythema, lesion or rash.  Neurological:     Mental Status: He is alert and oriented to person, place, and time. Mental status is at baseline.     Gait: Gait normal.  Psychiatric:        Mood and Affect: Mood normal.        Behavior: Behavior normal.        Thought Content: Thought content normal.        Judgment: Judgment normal.      No results found for any visits on 12/22/23.  Assessment & Plan    Routine Health Maintenance and Physical Exam  Exercise Activities and Dietary recommendations  Goals      DIET - EAT MORE FRUITS AND VEGETABLES     DIET - INCREASE WATER INTAKE        Immunization History  Administered Date(s) Administered   Moderna Sars-Covid-2 Vaccination 04/28/2019, 05/26/2019   Tdap 12/21/2011    Health Maintenance  Topic Date Due   Pneumococcal Vaccine for age over 70 (1 of 2 - PCV) Never done   Screening for Lung Cancer  Never done   Zoster (Shingles) Vaccine (1  of 2) Never done   DTaP/Tdap/Td vaccine (2 - Td or Tdap) 12/20/2021   COVID-19 Vaccine (3 - 2025-26 season) 09/05/2023   Flu Shot  04/03/2024*   Medicare Annual Wellness Visit  04/12/2024   Colon Cancer Screening  01/10/2030   Hepatitis C Screening  Completed   Meningitis B Vaccine  Aged Out  *Topic was postponed. The date shown is not the original due date.    Discussed health benefits of physical activity, and encouraged him to engage in regular exercise appropriate for his age and condition.  Assessment & Plan Annual physical exam Annual wellness visit  conducted. No acute concerns. Recent colonoscopy normal. - Ordered routine lab work. - Scheduled follow-up in one year for physical exam. UTD on dental/eye Things to do to keep yourself healthy  - Exercise at least 30-45 minutes a day, 3-4 days a week.  - Eat a low-fat diet with lots of fruits and vegetables, up to 7-9 servings per day.  - Seatbelts can save your life. Wear them always.  - Smoke detectors on every level of your home, check batteries every year.  - Eye Doctor - have an eye exam every 1-2 years  - Safe sex - if you may be exposed to STDs, use a condom.  - Alcohol -  If you drink, do it moderately, less than 2 drinks per day.  - Health Care Power of Attorney. Choose someone to speak for you if you are not able.  - Depression is common in our stressful world.If you're feeling down or losing interest in things you normally enjoy, please come in for a visit.  - Violence - If anyone is threatening or hurting you, please call immediately.  Hypertension Chronic Stable Taking Hyzaar and metoprolol . - Continue current antihypertensive medications. Continue low sodium diet and regular exercise Will follow-up  Hyperlipidemia Chronic Stable Taking Crestor . No new concerns identified. - Continue current lipid-lowering medications. Continue low cholesterol diet and daily exercise Will follow-up  CAD/Atherosclerotic  heart disease Managed with aspirin  81 mg daily. Recent cardiology evaluation normal. - Continue aspirin  81 mg daily.  Constipation Chronic and stable Occasional constipation with darker stools. No blood in stool. Recent colonoscopy normal. Possible dietary causes for stool color change. - Advised taking a picture of stool if concerned about color. - Recommended dietary modifications including increased fluid intake and consumption of yogurt or kefir.  Allergic rhinitis Chronic symptoms of eye watering and congestion. Prescribed Xyzal as an alternative to Zyrtec. - Prescribed Xyzal as an alternative to Zyrtec.   Obesity (BMI 30.0-34.9)  - PSA Total (Reflex To Free) - Comprehensive metabolic panel with GFR - CBC with Differential/Platelet - Lipid panel   No follow-ups on file.    The patient was advised to call back or seek an in-person evaluation if the symptoms worsen or if the condition fails to improve as anticipated.  I discussed the assessment and treatment plan with the patient. The patient was provided an opportunity to ask questions and all were answered. The patient agreed with the plan and demonstrated an understanding of the instructions.  I, Lamond Glantz, PA-C have reviewed all documentation for this visit. The documentation on 12/22/2023  for the exam, diagnosis, procedures, and orders are all accurate and complete.  Jolynn Spencer, [facility], MMS [facility] [phone] (phone) [phone] (fax)  [facility]     [1]  Allergies Allergen Reactions   Sulfa Antibiotics     Other reaction(s): Unknown Makes him feel worse then better when takenper wife  [2]  Outpatient Medications Prior to Visit  Medication Sig   aspirin  EC (ASPIRIN  81) 81 MG tablet Take 81 mg by mouth daily. Swallow whole.   azelastine  (ASTELIN ) 0.1 % nasal spray Place 2 sprays into both nostrils 2 (two) times daily. Use in each nostril as directed   cetirizine  (ZYRTEC) 10 MG tablet Take 10 mg by mouth every evening.   Cholecalciferol  (VITAMIN D3) 1.25 MG (50000 UT) CAPS Vitamin D3   ezetimibe  (ZETIA ) 10 MG tablet TAKE 1 TABLET BY MOUTH DAILY.   finasteride  (PROSCAR ) 5 MG tablet TAKE  1 TABLET BY MOUTH DAILY.   fluticasone (FLONASE) 50 MCG/ACT nasal spray fluticasone propionate 50 mcg/actuation nasal spray,suspension  SPRAY TWICE IN EACH NOSTRIL ONCE DAILY   losartan -hydrochlorothiazide (HYZAAR) 100-12.5 MG tablet Take 1 tablet by mouth daily.   metoprolol  succinate (TOPROL -XL) 50 MG 24 hr tablet Take 1 tablet (50 mg total) by mouth daily. TAKE WITH OR IMMEDIATELY FOLLOWING A MEAL.   pantoprazole  (PROTONIX ) 20 MG tablet Take 20 mg by mouth daily.   rosuvastatin  (CRESTOR ) 20 MG tablet Take 1 tablet (20 mg total) by mouth daily.   tamsulosin  (FLOMAX ) 0.4 MG CAPS capsule TAKE 1 CAPSULE BY MOUTH ONCE DAILY   [DISCONTINUED] benzonatate  (TESSALON ) 100 MG capsule Take 1 capsule (100 mg total) by mouth 3 (three) times daily as needed for cough.   [DISCONTINUED] doxycycline  (VIBRA -TABS) 100 MG tablet Take 1 tablet (100 mg total) by mouth 2 (two) times daily.   No facility-administered medications prior to visit.

## 2023-12-23 LAB — CBC WITH DIFFERENTIAL/PLATELET
Basophils Absolute: 0.1 x10E3/uL (ref 0.0–0.2)
Basos: 1 %
EOS (ABSOLUTE): 0.2 x10E3/uL (ref 0.0–0.4)
Eos: 2 %
Hematocrit: 52.8 % — ABNORMAL HIGH (ref 37.5–51.0)
Hemoglobin: 18 g/dL — ABNORMAL HIGH (ref 13.0–17.7)
Immature Grans (Abs): 0 x10E3/uL (ref 0.0–0.1)
Immature Granulocytes: 0 %
Lymphocytes Absolute: 2.4 x10E3/uL (ref 0.7–3.1)
Lymphs: 23 %
MCH: 32 pg (ref 26.6–33.0)
MCHC: 34.1 g/dL (ref 31.5–35.7)
MCV: 94 fL (ref 79–97)
Monocytes Absolute: 0.7 x10E3/uL (ref 0.1–0.9)
Monocytes: 7 %
Neutrophils Absolute: 6.8 x10E3/uL (ref 1.4–7.0)
Neutrophils: 67 %
Platelets: 212 x10E3/uL (ref 150–450)
RBC: 5.63 x10E6/uL (ref 4.14–5.80)
RDW: 12.4 % (ref 11.6–15.4)
WBC: 10.1 x10E3/uL (ref 3.4–10.8)

## 2023-12-23 LAB — LIPID PANEL
Chol/HDL Ratio: 2.7 ratio (ref 0.0–5.0)
Cholesterol, Total: 114 mg/dL (ref 100–199)
HDL: 43 mg/dL
LDL Chol Calc (NIH): 53 mg/dL (ref 0–99)
Triglycerides: 93 mg/dL (ref 0–149)
VLDL Cholesterol Cal: 18 mg/dL (ref 5–40)

## 2023-12-23 LAB — COMPREHENSIVE METABOLIC PANEL WITH GFR
ALT: 27 IU/L (ref 0–44)
AST: 22 IU/L (ref 0–40)
Albumin: 4.2 g/dL (ref 3.9–4.9)
Alkaline Phosphatase: 82 IU/L (ref 47–123)
BUN/Creatinine Ratio: 14 (ref 10–24)
BUN: 16 mg/dL (ref 8–27)
Bilirubin Total: 1.6 mg/dL — ABNORMAL HIGH (ref 0.0–1.2)
CO2: 24 mmol/L (ref 20–29)
Calcium: 9.5 mg/dL (ref 8.6–10.2)
Chloride: 102 mmol/L (ref 96–106)
Creatinine, Ser: 1.13 mg/dL (ref 0.76–1.27)
Globulin, Total: 2.4 g/dL (ref 1.5–4.5)
Glucose: 99 mg/dL (ref 70–99)
Potassium: 3.9 mmol/L (ref 3.5–5.2)
Sodium: 140 mmol/L (ref 134–144)
Total Protein: 6.6 g/dL (ref 6.0–8.5)
eGFR: 70 mL/min/1.73

## 2023-12-23 LAB — PSA TOTAL (REFLEX TO FREE): Prostate Specific Ag, Serum: 1.2 ng/mL (ref 0.0–4.0)

## 2023-12-24 ENCOUNTER — Encounter: Payer: Self-pay | Admitting: Physician Assistant

## 2023-12-24 DIAGNOSIS — J3089 Other allergic rhinitis: Secondary | ICD-10-CM

## 2023-12-26 ENCOUNTER — Ambulatory Visit: Payer: Self-pay | Admitting: Physician Assistant

## 2023-12-26 MED ORDER — LEVOCETIRIZINE DIHYDROCHLORIDE 5 MG PO TABS: 5.0000 mg | ORAL_TABLET | Freq: Every evening | ORAL | 2 refills | Status: AC

## 2023-12-30 ENCOUNTER — Other Ambulatory Visit: Payer: Self-pay | Admitting: Physician Assistant

## 2023-12-30 DIAGNOSIS — N401 Enlarged prostate with lower urinary tract symptoms: Secondary | ICD-10-CM

## 2024-01-02 ENCOUNTER — Other Ambulatory Visit: Payer: Self-pay | Admitting: Cardiovascular Disease

## 2024-01-03 MED ORDER — EZETIMIBE 10 MG PO TABS: 10.0000 mg | ORAL_TABLET | Freq: Every day | ORAL | 1 refills | Status: AC

## 2024-03-21 ENCOUNTER — Ambulatory Visit: Admitting: Physician Assistant

## 2024-04-17 ENCOUNTER — Ambulatory Visit

## 2024-07-27 ENCOUNTER — Encounter
